# Patient Record
Sex: Male | Born: 1945
Health system: Southern US, Community
[De-identification: ages and names within clinical notes are randomized; demographics above are authoritative.]

## PROBLEM LIST (undated history)

## (undated) DIAGNOSIS — K219 Gastro-esophageal reflux disease without esophagitis: Secondary | ICD-10-CM

## (undated) DIAGNOSIS — I219 Acute myocardial infarction, unspecified: Secondary | ICD-10-CM

## (undated) DIAGNOSIS — I779 Disorder of arteries and arterioles, unspecified: Secondary | ICD-10-CM

## (undated) DIAGNOSIS — I472 Ventricular tachycardia: Secondary | ICD-10-CM

## (undated) DIAGNOSIS — E785 Hyperlipidemia, unspecified: Secondary | ICD-10-CM

## (undated) DIAGNOSIS — I10 Essential (primary) hypertension: Secondary | ICD-10-CM

## (undated) DIAGNOSIS — E119 Type 2 diabetes mellitus without complications: Secondary | ICD-10-CM

## (undated) DIAGNOSIS — Z95810 Presence of automatic (implantable) cardiac defibrillator: Secondary | ICD-10-CM

## (undated) DIAGNOSIS — I739 Peripheral vascular disease, unspecified: Secondary | ICD-10-CM

## (undated) DIAGNOSIS — C801 Malignant (primary) neoplasm, unspecified: Secondary | ICD-10-CM

## (undated) DIAGNOSIS — M1990 Unspecified osteoarthritis, unspecified site: Secondary | ICD-10-CM

## (undated) HISTORY — PX: ELBOW SURGERY: SHX618

## (undated) HISTORY — DX: Peripheral vascular disease, unspecified: I73.9

## (undated) HISTORY — PX: COLON SURGERY: SHX602

## (undated) HISTORY — DX: Disorder of arteries and arterioles, unspecified: I77.9

## (undated) HISTORY — PX: BACK SURGERY: SHX140

## (undated) HISTORY — PX: CHOLECYSTECTOMY: SHX55

---

## 1993-06-29 HISTORY — PX: CORONARY ARTERY BYPASS GRAFT: SHX141

## 1998-04-18 ENCOUNTER — Encounter: Admission: RE | Admit: 1998-04-18 | Discharge: 1998-04-18 | Payer: Self-pay | Admitting: *Deleted

## 1999-01-06 ENCOUNTER — Other Ambulatory Visit: Admission: RE | Admit: 1999-01-06 | Discharge: 1999-01-06 | Payer: Self-pay | Admitting: Gastroenterology

## 1999-01-21 ENCOUNTER — Encounter: Payer: Self-pay | Admitting: General Surgery

## 1999-01-23 ENCOUNTER — Inpatient Hospital Stay (HOSPITAL_COMMUNITY): Admission: RE | Admit: 1999-01-23 | Discharge: 1999-01-28 | Payer: Self-pay | Admitting: General Surgery

## 1999-12-10 ENCOUNTER — Ambulatory Visit (HOSPITAL_COMMUNITY): Admission: RE | Admit: 1999-12-10 | Discharge: 1999-12-10 | Payer: Self-pay | Admitting: Orthopedic Surgery

## 1999-12-10 ENCOUNTER — Encounter: Payer: Self-pay | Admitting: Orthopedic Surgery

## 2001-02-19 ENCOUNTER — Encounter: Payer: Self-pay | Admitting: Orthopedic Surgery

## 2001-02-19 ENCOUNTER — Ambulatory Visit (HOSPITAL_COMMUNITY): Admission: RE | Admit: 2001-02-19 | Discharge: 2001-02-19 | Payer: Self-pay | Admitting: Orthopedic Surgery

## 2001-06-13 ENCOUNTER — Ambulatory Visit (HOSPITAL_COMMUNITY): Admission: RE | Admit: 2001-06-13 | Discharge: 2001-06-13 | Payer: Self-pay | Admitting: Orthopedic Surgery

## 2001-06-13 ENCOUNTER — Encounter: Payer: Self-pay | Admitting: Orthopedic Surgery

## 2001-10-17 ENCOUNTER — Ambulatory Visit (HOSPITAL_COMMUNITY): Admission: RE | Admit: 2001-10-17 | Discharge: 2001-10-17 | Payer: Self-pay | Admitting: Specialist

## 2001-10-17 ENCOUNTER — Encounter: Payer: Self-pay | Admitting: Specialist

## 2004-08-01 ENCOUNTER — Ambulatory Visit: Payer: Self-pay | Admitting: Internal Medicine

## 2007-09-21 ENCOUNTER — Encounter: Payer: Self-pay | Admitting: Internal Medicine

## 2008-12-19 ENCOUNTER — Encounter: Payer: Self-pay | Admitting: Internal Medicine

## 2010-01-02 ENCOUNTER — Encounter: Payer: Self-pay | Admitting: Internal Medicine

## 2010-07-31 NOTE — Letter (Signed)
Summary: [facility]   Imported By: Sherian Rein 01/08/2010 14:24:31  _____________________________________________________________________  External Attachment:    Type:   Image     Comment:   External Document

## 2011-06-09 DIAGNOSIS — I252 Old myocardial infarction: Secondary | ICD-10-CM | POA: Insufficient documentation

## 2011-06-09 DIAGNOSIS — M5430 Sciatica, unspecified side: Secondary | ICD-10-CM | POA: Insufficient documentation

## 2011-06-09 DIAGNOSIS — M25569 Pain in unspecified knee: Secondary | ICD-10-CM | POA: Insufficient documentation

## 2011-06-09 DIAGNOSIS — I10 Essential (primary) hypertension: Secondary | ICD-10-CM | POA: Insufficient documentation

## 2011-06-10 ENCOUNTER — Emergency Department (HOSPITAL_COMMUNITY)
Admission: EM | Admit: 2011-06-10 | Discharge: 2011-06-10 | Disposition: A | Discharge status: Home / Self Care | Payer: Medicare Other | Attending: Emergency Medicine | Admitting: Emergency Medicine

## 2011-06-10 ENCOUNTER — Encounter: Payer: Self-pay | Admitting: Emergency Medicine

## 2011-06-10 DIAGNOSIS — M5431 Sciatica, right side: Secondary | ICD-10-CM

## 2011-06-10 HISTORY — DX: Essential (primary) hypertension: I10

## 2011-06-10 HISTORY — DX: Malignant (primary) neoplasm, unspecified: C80.1

## 2011-06-10 HISTORY — DX: Acute myocardial infarction, unspecified: I21.9

## 2011-06-10 MED ORDER — OXYCODONE-ACETAMINOPHEN 5-325 MG PO TABS: 1.0000 | ORAL_TABLET | ORAL | Status: AC | PRN

## 2011-06-10 MED ORDER — ONDANSETRON 8 MG PO TBDP
8.0000 mg | ORAL_TABLET | Freq: Once | ORAL | Status: AC
  Administered 2011-06-10: 8 mg via ORAL
  Filled 2011-06-10: qty 1

## 2011-06-10 MED ORDER — HYDROMORPHONE HCL PF 2 MG/ML IJ SOLN
2.0000 mg | Freq: Once | INTRAMUSCULAR | Status: AC
  Administered 2011-06-10: 2 mg via INTRAMUSCULAR
  Filled 2011-06-10: qty 1

## 2011-06-10 MED ORDER — DIAZEPAM 5 MG PO TABS
5.0000 mg | ORAL_TABLET | Freq: Once | ORAL | Status: AC
  Administered 2011-06-10: 5 mg via ORAL
  Filled 2011-06-10: qty 1

## 2011-06-10 MED ORDER — DIAZEPAM 5 MG PO TABS: 5.0000 mg | ORAL_TABLET | Freq: Three times a day (TID) | ORAL | Status: AC | PRN

## 2011-06-10 NOTE — ED Provider Notes (Signed)
History     CSN: [id number] Arrival date & time: 06/10/2011 12:20 AM   First MD Initiated Contact with Patient 06/10/11 0138      Chief Complaint  Patient presents with  . Knee Pain     Patient is a 65 y.o. male presenting with knee pain. The history is provided by the patient.  Knee Pain This is a recurrent problem. The current episode started 1 to 4 weeks ago. The problem occurs constantly. The problem has been gradually worsening. Pertinent negatives include no numbness, urinary symptoms or weakness.  Reports approximate two-week history of right lower back pain that radiates into the right lower extremity. Has been seen at the [place], and MRI was obtained and patient was told there does seem to be some narrowing in his lower spine causing his symptoms. He was sent home with Tylenol and tramadol patient states Tylenol and tramadol are not helping his pain. He is scheduled to see a physician at the outpatient clinic at [facility] on December 26, the pain is so severe he needed something different for pain. Patient denies any neurological symptoms such as perineal numbness. Tingling, loss of bowel or bladder or weakness to the RLE.    Past Medical History  Diagnosis Date  . Myocardial infarct   . Cancer   . Hypertension     Past Surgical History  Procedure Date  . Cardiac surgery   . Colon surgery   . Elbow surgery     Family History  Problem Relation Age of Onset  . Hypertension Other     History  Substance Use Topics  . Smoking status: Never Smoker   . Smokeless tobacco: Not on file  . Alcohol Use: No      Review of Systems  Constitutional: Negative.   HENT: Negative.   Eyes: Negative.   Respiratory: Negative.   Cardiovascular: Negative.   Gastrointestinal: Negative.   Genitourinary: Negative.   Musculoskeletal: Negative.   Skin: Negative.   Neurological: Negative.  Negative for weakness and numbness.  Hematological: Negative.   Psychiatric/Behavioral:  Negative.     Allergies  Biaxin  Home Medications   Current Outpatient Rx  Name Route Sig Dispense Refill  . ACETAMINOPHEN 325 MG PO TABS Oral Take 650 mg by mouth every 6 (six) hours as needed.      . ASPIRIN 325 MG PO TABS Oral Take 325 mg by mouth daily.      [name] CLOPIDOGREL BISULFATE 75 MG PO TABS Oral Take 75 mg by mouth daily.      [name] DOXAZOSIN MESYLATE 2 MG PO TABS Oral Take 2 mg by mouth at bedtime.      [name] EZETIMIBE 10 MG PO TABS Oral Take 10 mg by mouth daily.      [name] LISINOPRIL 20 MG PO TABS Oral Take 20 mg by mouth daily.      [name] METOPROLOL TARTRATE 25 MG PO TABS Oral Take 25 mg by mouth 2 (two) times daily.      [name] PANTOPRAZOLE SODIUM 40 MG PO TBEC Oral Take 40 mg by mouth daily.      [name] SIMVASTATIN 80 MG PO TABS Oral Take 80 mg by mouth at bedtime.      . TRAMADOL HCL 50 MG PO TABS Oral Take 50 mg by mouth every 6 (six) hours as needed. Maximum dose= 8 tablets per day       BP 157/83  Pulse 70  Temp(Src) 98 F (36.7 C) (Oral)  Resp  18  SpO2 96%  Physical Exam  Constitutional: He is oriented to person, place, and time. He appears well-developed and well-nourished.  HENT:  Head: Normocephalic and atraumatic.  Eyes: Conjunctivae are normal.  Neck: Neck supple.  Cardiovascular: Normal rate and regular rhythm.   Pulmonary/Chest: Effort normal and breath sounds normal.  Abdominal: Soft. Bowel sounds are normal.  Musculoskeletal: Normal range of motion.       Back:  Neurological: He is alert and oriented to person, place, and time. He has normal reflexes.  Skin: Skin is warm and dry.  Psychiatric: He has a normal mood and affect.    ED Course  Procedures Pt w/ persistent LBP that radiates to RLE. Recently prescribed Tylenol and Tramadol for pain which is not helping. Will medicate for pain and plan for d/c home w/ stronger med for pain.   Pt reports significant relief of pain with medications. Will d/c home w/ Rx's for Percocet and Valium and encourage pt to keep  scheduled appointment at the Outpt Cinic on 06/24/2011. Pt agreeable w/ plan.  Labs Reviewed - No data to display No results found.   No diagnosis found.    MDM  Sciatica type (R) LBP w/o neurological symptoms.        Leanne Chang, NP 06/10/11 [phone]

## 2011-06-10 NOTE — ED Notes (Signed)
Pt ready for d/c . Pain better. Rates pain 3/10. Ambulatory. stable

## 2011-06-10 NOTE — ED Notes (Signed)
Pt states he started having pain 2 weeks ago  Pt states the pain runs down the back of his right leg down into his ankle  Pt states he is scheduled our outpt clinic on [facility] on the 26th but his pain is too bad to wait  Pt states he has been taking tramadol for the pain and has been using tylenol without relief

## 2011-06-10 NOTE — ED Provider Notes (Signed)
Medical screening examination/treatment/procedure(s) were performed by non-physician practitioner and as supervising physician I was immediately available for consultation/collaboration.   Cyndra Numbers, MD 06/10/11 1600

## 2011-06-15 ENCOUNTER — Ambulatory Visit: Payer: Non-veteran care | Admitting: Physical Therapy

## 2011-06-24 ENCOUNTER — Ambulatory Visit: Payer: Non-veteran care | Admitting: Physical Therapy

## 2011-07-02 DIAGNOSIS — M5137 Other intervertebral disc degeneration, lumbosacral region: Secondary | ICD-10-CM | POA: Diagnosis not present

## 2011-07-02 DIAGNOSIS — IMO0002 Reserved for concepts with insufficient information to code with codable children: Secondary | ICD-10-CM | POA: Diagnosis not present

## 2011-07-16 DIAGNOSIS — M5430 Sciatica, unspecified side: Secondary | ICD-10-CM | POA: Diagnosis not present

## 2011-07-24 DIAGNOSIS — M5137 Other intervertebral disc degeneration, lumbosacral region: Secondary | ICD-10-CM | POA: Diagnosis not present

## 2011-07-24 DIAGNOSIS — IMO0002 Reserved for concepts with insufficient information to code with codable children: Secondary | ICD-10-CM | POA: Diagnosis not present

## 2011-08-05 DIAGNOSIS — M5430 Sciatica, unspecified side: Secondary | ICD-10-CM | POA: Diagnosis not present

## 2011-08-07 DIAGNOSIS — M545 Low back pain: Secondary | ICD-10-CM | POA: Diagnosis not present

## 2011-08-07 DIAGNOSIS — M5137 Other intervertebral disc degeneration, lumbosacral region: Secondary | ICD-10-CM | POA: Diagnosis not present

## 2011-08-11 ENCOUNTER — Other Ambulatory Visit: Payer: Self-pay | Admitting: Neurological Surgery

## 2011-08-11 DIAGNOSIS — M549 Dorsalgia, unspecified: Secondary | ICD-10-CM

## 2011-08-14 ENCOUNTER — Ambulatory Visit
Admission: RE | Admit: 2011-08-14 | Discharge: 2011-08-14 | Disposition: A | Discharge status: Home / Self Care | Payer: Medicare Other | Source: Ambulatory Visit | Attending: Neurological Surgery | Admitting: Neurological Surgery

## 2011-08-14 VITALS — BP 126/60 | HR 44

## 2011-08-14 DIAGNOSIS — M549 Dorsalgia, unspecified: Secondary | ICD-10-CM

## 2011-08-14 DIAGNOSIS — M545 Low back pain: Secondary | ICD-10-CM | POA: Diagnosis not present

## 2011-08-14 DIAGNOSIS — M79609 Pain in unspecified limb: Secondary | ICD-10-CM | POA: Diagnosis not present

## 2011-08-14 MED ORDER — DIAZEPAM 5 MG PO TABS: 5.0000 mg | ORAL_TABLET | Freq: Once | ORAL | Status: DC

## 2011-08-14 MED ORDER — DIAZEPAM 5 MG PO TABS
5.0000 mg | ORAL_TABLET | Freq: Once | ORAL | Status: AC
  Administered 2011-08-14: 5 mg via ORAL

## 2011-08-14 NOTE — Progress Notes (Signed)
Patient states he has been off Plavix for the past five days.  jkl 

## 2011-08-26 DIAGNOSIS — IMO0002 Reserved for concepts with insufficient information to code with codable children: Secondary | ICD-10-CM | POA: Diagnosis not present

## 2011-09-01 DIAGNOSIS — M47817 Spondylosis without myelopathy or radiculopathy, lumbosacral region: Secondary | ICD-10-CM | POA: Diagnosis not present

## 2011-09-01 DIAGNOSIS — IMO0002 Reserved for concepts with insufficient information to code with codable children: Secondary | ICD-10-CM | POA: Diagnosis not present

## 2011-09-04 NOTE — Pre-Procedure Instructions (Signed)
20 Victor Estes  09/04/2011   Your procedure is scheduled on:  MARCH 13 (WED)  Report to [facility] at 12:45 AM.  Call this number if you have problems the morning of surgery: [phone]   Remember:   Do not eat food:After Midnight.  May have clear liquids: up to 4 Hours before arrival.  Clear liquids include soda, tea, black coffee, apple or grape juice, broth.  Take these medicines the morning of surgery with A SIP OF WATER: CARDURA, LOPRESSOR,PROTONIX,ULTRAM            PLAVIX AND ASPIRIN AS DIRECTED   Do not wear jewelry, make-up or nail polish.  Do not wear lotions, powders, or perfumes. You may wear deodorant.  Do not shave 48 hours prior to surgery.  Do not bring valuables to the hospital.  Contacts, dentures or bridgework may not be worn into surgery.  Leave suitcase in the car. After surgery it may be brought to your room.  For patients admitted to the hospital, checkout time is 11:00 AM the day of discharge.   Patients discharged the day of surgery will not be allowed to drive home.  Name and phone number of your driver: NA  Special Instructions: CHG Shower Use Special Wash: 1/2 bottle night before surgery and 1/2 bottle morning of surgery.   Please read over the following fact sheets that you were given: Pain Booklet, MRSA Information and Surgical Site Infection Prevention

## 2011-09-07 ENCOUNTER — Encounter (HOSPITAL_COMMUNITY): Payer: Self-pay | Admitting: Pharmacy Technician

## 2011-09-07 ENCOUNTER — Encounter (HOSPITAL_COMMUNITY): Payer: Self-pay

## 2011-09-07 ENCOUNTER — Encounter (HOSPITAL_COMMUNITY)
Admission: RE | Admit: 2011-09-07 | Discharge: 2011-09-07 | Disposition: A | Discharge status: Home / Self Care | Payer: Medicare Other | Source: Ambulatory Visit | Attending: Neurological Surgery | Admitting: Neurological Surgery

## 2011-09-07 HISTORY — DX: Gastro-esophageal reflux disease without esophagitis: K21.9

## 2011-09-07 LAB — PROTIME-INR: INR: 0.96 (ref 0.00–1.49)

## 2011-09-07 LAB — CBC
HCT: 43.5 % (ref 39.0–52.0)
MCHC: 36.1 g/dL — ABNORMAL HIGH (ref 30.0–36.0)
RDW: 12.8 % (ref 11.5–15.5)

## 2011-09-07 LAB — BASIC METABOLIC PANEL
BUN: 13 mg/dL (ref 6–23)
Creatinine, Ser: 0.98 mg/dL (ref 0.50–1.35)
GFR calc Af Amer: >90 mL/min (ref >90)
GFR calc non Af Amer: 84 mL/min — ABNORMAL LOW (ref >90)

## 2011-09-08 NOTE — Progress Notes (Signed)
Voicemail left requesting orders from Dr. Yetta Barre at [phone]. Instructed to fax orders to [phone].

## 2011-09-08 NOTE — Progress Notes (Signed)
Called for orders. Spoke with Erie Noe and she stated she would enter them.

## 2011-09-09 ENCOUNTER — Encounter (HOSPITAL_COMMUNITY): Payer: Self-pay | Admitting: Anesthesiology

## 2011-09-09 ENCOUNTER — Other Ambulatory Visit: Payer: Self-pay

## 2011-09-09 ENCOUNTER — Inpatient Hospital Stay (HOSPITAL_COMMUNITY)
Admission: RE | Admit: 2011-09-09 | Discharge: 2011-09-10 | DRG: 491 | Disposition: A | Discharge status: Home / Self Care | Payer: Medicare Other | Source: Ambulatory Visit | Attending: Neurological Surgery | Admitting: Neurological Surgery

## 2011-09-09 ENCOUNTER — Inpatient Hospital Stay (HOSPITAL_COMMUNITY): Payer: Medicare Other

## 2011-09-09 ENCOUNTER — Encounter (HOSPITAL_COMMUNITY)
Admission: RE | Disposition: A | Discharge status: Home / Self Care | Payer: Self-pay | Source: Ambulatory Visit | Attending: Neurological Surgery

## 2011-09-09 ENCOUNTER — Encounter (HOSPITAL_COMMUNITY): Payer: Self-pay | Admitting: Surgery

## 2011-09-09 ENCOUNTER — Inpatient Hospital Stay (HOSPITAL_COMMUNITY): Payer: Medicare Other | Admitting: Anesthesiology

## 2011-09-09 DIAGNOSIS — K219 Gastro-esophageal reflux disease without esophagitis: Secondary | ICD-10-CM | POA: Diagnosis present

## 2011-09-09 DIAGNOSIS — I10 Essential (primary) hypertension: Secondary | ICD-10-CM | POA: Diagnosis not present

## 2011-09-09 DIAGNOSIS — M48061 Spinal stenosis, lumbar region without neurogenic claudication: Principal | ICD-10-CM | POA: Diagnosis present

## 2011-09-09 DIAGNOSIS — Z79899 Other long term (current) drug therapy: Secondary | ICD-10-CM | POA: Diagnosis not present

## 2011-09-09 DIAGNOSIS — Z7982 Long term (current) use of aspirin: Secondary | ICD-10-CM

## 2011-09-09 DIAGNOSIS — Z7902 Long term (current) use of antithrombotics/antiplatelets: Secondary | ICD-10-CM | POA: Diagnosis not present

## 2011-09-09 DIAGNOSIS — Z01811 Encounter for preprocedural respiratory examination: Secondary | ICD-10-CM | POA: Diagnosis not present

## 2011-09-09 DIAGNOSIS — I517 Cardiomegaly: Secondary | ICD-10-CM | POA: Diagnosis not present

## 2011-09-09 DIAGNOSIS — Z951 Presence of aortocoronary bypass graft: Secondary | ICD-10-CM | POA: Diagnosis not present

## 2011-09-09 DIAGNOSIS — Z9889 Other specified postprocedural states: Secondary | ICD-10-CM

## 2011-09-09 DIAGNOSIS — M545 Low back pain, unspecified: Secondary | ICD-10-CM | POA: Diagnosis not present

## 2011-09-09 DIAGNOSIS — Z01812 Encounter for preprocedural laboratory examination: Secondary | ICD-10-CM | POA: Diagnosis not present

## 2011-09-09 DIAGNOSIS — M47817 Spondylosis without myelopathy or radiculopathy, lumbosacral region: Secondary | ICD-10-CM | POA: Diagnosis not present

## 2011-09-09 DIAGNOSIS — M519 Unspecified thoracic, thoracolumbar and lumbosacral intervertebral disc disorder: Secondary | ICD-10-CM | POA: Diagnosis not present

## 2011-09-09 DIAGNOSIS — I252 Old myocardial infarction: Secondary | ICD-10-CM | POA: Diagnosis not present

## 2011-09-09 DIAGNOSIS — IMO0002 Reserved for concepts with insufficient information to code with codable children: Secondary | ICD-10-CM | POA: Diagnosis not present

## 2011-09-09 DIAGNOSIS — M79609 Pain in unspecified limb: Secondary | ICD-10-CM | POA: Diagnosis not present

## 2011-09-09 HISTORY — PX: LUMBAR LAMINECTOMY/DECOMPRESSION MICRODISCECTOMY: SHX5026

## 2011-09-09 LAB — DIFFERENTIAL
Eosinophils Absolute: 0.3 10*3/uL (ref 0.0–0.7)
Eosinophils Relative: 2 % (ref 0–5)
Lymphocytes Relative: 15 % (ref 12–46)
Lymphs Abs: 1.7 10*3/uL (ref 0.7–4.0)
Monocytes Absolute: 1.1 10*3/uL — ABNORMAL HIGH (ref 0.1–1.0)

## 2011-09-09 SURGERY — LUMBAR LAMINECTOMY/DECOMPRESSION MICRODISCECTOMY 1 LEVEL
Anesthesia: General | Site: Back | Laterality: Right | Wound class: Clean

## 2011-09-09 MED ORDER — SODIUM CHLORIDE 0.9 % IJ SOLN: 3.0000 mL | INTRAMUSCULAR | Status: DC | PRN

## 2011-09-09 MED ORDER — FENTANYL CITRATE 0.05 MG/ML IJ SOLN
INTRAMUSCULAR | Status: AC
  Filled 2011-09-09: qty 2

## 2011-09-09 MED ORDER — FENTANYL CITRATE 0.05 MG/ML IJ SOLN
INTRAMUSCULAR | Status: DC | PRN
  Administered 2011-09-09: 50 ug via INTRAVENOUS
  Administered 2011-09-09: 150 ug via INTRAVENOUS

## 2011-09-09 MED ORDER — FENTANYL CITRATE 0.05 MG/ML IJ SOLN
INTRAMUSCULAR | Status: DC | PRN
  Administered 2011-09-09: 100 ug via INTRAVENOUS

## 2011-09-09 MED ORDER — DOXAZOSIN MESYLATE 2 MG PO TABS
2.0000 mg | ORAL_TABLET | Freq: Every day | ORAL | Status: DC
Start: 2011-09-09 — End: 2011-09-10
  Administered 2011-09-09: 2 mg via ORAL
  Filled 2011-09-09 (×2): qty 1

## 2011-09-09 MED ORDER — ONDANSETRON HCL 4 MG/2ML IJ SOLN: 4.0000 mg | Freq: Once | INTRAMUSCULAR | Status: DC | PRN

## 2011-09-09 MED ORDER — HYDROCODONE-ACETAMINOPHEN 5-325 MG PO TABS
1.0000 | ORAL_TABLET | ORAL | Status: DC | PRN
  Administered 2011-09-09 – 2011-09-10 (×3): 2 via ORAL
  Filled 2011-09-09 (×2): qty 2

## 2011-09-09 MED ORDER — DEXAMETHASONE SODIUM PHOSPHATE 10 MG/ML IJ SOLN
INTRAMUSCULAR | Status: AC
  Filled 2011-09-09: qty 1

## 2011-09-09 MED ORDER — DEXAMETHASONE SODIUM PHOSPHATE 4 MG/ML IJ SOLN
4.0000 mg | Freq: Four times a day (QID) | INTRAMUSCULAR | Status: DC
  Filled 2011-09-09 (×4): qty 1

## 2011-09-09 MED ORDER — DEXAMETHASONE SODIUM PHOSPHATE 10 MG/ML IJ SOLN
10.0000 mg | Freq: Once | INTRAMUSCULAR | Status: AC
  Administered 2011-09-09: 10 mg via INTRAVENOUS

## 2011-09-09 MED ORDER — THROMBIN 5000 UNITS EX KIT
PACK | CUTANEOUS | Status: DC | PRN
  Administered 2011-09-09 (×2): 5000 [IU] via TOPICAL

## 2011-09-09 MED ORDER — HEMOSTATIC AGENTS (NO CHARGE) OPTIME
TOPICAL | Status: DC | PRN
  Administered 2011-09-09: 1 via TOPICAL

## 2011-09-09 MED ORDER — ACETAMINOPHEN 325 MG PO TABS: 650.0000 mg | ORAL_TABLET | ORAL | Status: DC | PRN

## 2011-09-09 MED ORDER — SENNA 8.6 MG PO TABS
1.0000 | ORAL_TABLET | Freq: Two times a day (BID) | ORAL | Status: DC
  Administered 2011-09-09 (×2): 8.6 mg via ORAL
  Filled 2011-09-09 (×4): qty 1

## 2011-09-09 MED ORDER — NEOSTIGMINE METHYLSULFATE 1 MG/ML IJ SOLN
INTRAMUSCULAR | Status: DC | PRN
  Administered 2011-09-09: 3 mg via INTRAVENOUS

## 2011-09-09 MED ORDER — LISINOPRIL 20 MG PO TABS
20.0000 mg | ORAL_TABLET | Freq: Every day | ORAL | Status: DC
  Administered 2011-09-09: 20 mg via ORAL
  Filled 2011-09-09 (×2): qty 1

## 2011-09-09 MED ORDER — METHYLPREDNISOLONE ACETATE PF 80 MG/ML IJ SUSP
INTRAMUSCULAR | Status: DC | PRN
  Administered 2011-09-09: 80 mg

## 2011-09-09 MED ORDER — ZOLPIDEM TARTRATE 5 MG PO TABS: 5.0000 mg | ORAL_TABLET | Freq: Every evening | ORAL | Status: DC | PRN

## 2011-09-09 MED ORDER — LACTATED RINGERS IV SOLN
INTRAVENOUS | Status: DC | PRN
  Administered 2011-09-09 (×2): via INTRAVENOUS

## 2011-09-09 MED ORDER — EPHEDRINE SULFATE 50 MG/ML IJ SOLN
INTRAMUSCULAR | Status: DC | PRN
  Administered 2011-09-09: 10 mg via INTRAVENOUS

## 2011-09-09 MED ORDER — METOPROLOL TARTRATE 25 MG PO TABS
25.0000 mg | ORAL_TABLET | Freq: Two times a day (BID) | ORAL | Status: DC
  Filled 2011-09-09 (×4): qty 1

## 2011-09-09 MED ORDER — CEFAZOLIN SODIUM-DEXTROSE 2-3 GM-% IV SOLR
INTRAVENOUS | Status: AC
  Filled 2011-09-09: qty 50

## 2011-09-09 MED ORDER — 0.9 % SODIUM CHLORIDE (POUR BTL) OPTIME
TOPICAL | Status: DC | PRN
  Administered 2011-09-09: 1000 mL

## 2011-09-09 MED ORDER — MUPIROCIN 2 % EX OINT
TOPICAL_OINTMENT | CUTANEOUS | Status: AC
  Filled 2011-09-09: qty 22

## 2011-09-09 MED ORDER — ACETAMINOPHEN 650 MG RE SUPP: 650.0000 mg | RECTAL | Status: DC | PRN

## 2011-09-09 MED ORDER — CEFAZOLIN SODIUM 1-5 GM-% IV SOLN
1.0000 g | Freq: Three times a day (TID) | INTRAVENOUS | Status: AC
  Administered 2011-09-09 (×2): 1 g via INTRAVENOUS
  Filled 2011-09-09 (×2): qty 50

## 2011-09-09 MED ORDER — KETOROLAC TROMETHAMINE 30 MG/ML IJ SOLN
INTRAMUSCULAR | Status: DC | PRN
  Administered 2011-09-09: 30 mg via INTRAVENOUS

## 2011-09-09 MED ORDER — MENTHOL 3 MG MT LOZG
1.0000 | LOZENGE | OROMUCOSAL | Status: DC | PRN
  Filled 2011-09-09: qty 9

## 2011-09-09 MED ORDER — ASPIRIN 325 MG PO TABS
325.0000 mg | ORAL_TABLET | Freq: Every day | ORAL | Status: DC
Start: 2011-09-09 — End: 2011-09-10
  Administered 2011-09-09: 325 mg via ORAL
  Filled 2011-09-09 (×2): qty 1

## 2011-09-09 MED ORDER — HYDROMORPHONE HCL PF 1 MG/ML IJ SOLN
0.5000 mg | INTRAMUSCULAR | Status: DC | PRN
  Administered 2011-09-09: 1 mg via INTRAVENOUS
  Filled 2011-09-09: qty 1

## 2011-09-09 MED ORDER — PHENYLEPHRINE HCL 10 MG/ML IJ SOLN
INTRAMUSCULAR | Status: DC | PRN
  Administered 2011-09-09: 40 ug via INTRAVENOUS

## 2011-09-09 MED ORDER — PROPOFOL 10 MG/ML IV EMUL
INTRAVENOUS | Status: DC | PRN
  Administered 2011-09-09: 150 mg via INTRAVENOUS

## 2011-09-09 MED ORDER — CEFAZOLIN SODIUM-DEXTROSE 2-3 GM-% IV SOLR: 2.0000 g | INTRAVENOUS | Status: DC

## 2011-09-09 MED ORDER — ROCURONIUM BROMIDE 100 MG/10ML IV SOLN
INTRAVENOUS | Status: DC | PRN
  Administered 2011-09-09: 50 mg via INTRAVENOUS
  Administered 2011-09-09: 5 mg via INTRAVENOUS

## 2011-09-09 MED ORDER — POTASSIUM CHLORIDE IN NACL 20-0.9 MEQ/L-% IV SOLN
INTRAVENOUS | Status: DC
  Administered 2011-09-09: 16:00:00 via INTRAVENOUS
  Filled 2011-09-09 (×3): qty 1000

## 2011-09-09 MED ORDER — EZETIMIBE 10 MG PO TABS
10.0000 mg | ORAL_TABLET | Freq: Every day | ORAL | Status: DC
  Administered 2011-09-09: 10 mg via ORAL
  Filled 2011-09-09 (×2): qty 1

## 2011-09-09 MED ORDER — SODIUM CHLORIDE 0.9 % IV SOLN
INTRAVENOUS | Status: AC
  Filled 2011-09-09: qty 500

## 2011-09-09 MED ORDER — SODIUM CHLORIDE 0.9 % IR SOLN
Status: DC | PRN
  Administered 2011-09-09: 12:00:00

## 2011-09-09 MED ORDER — GLYCOPYRROLATE 0.2 MG/ML IJ SOLN
INTRAMUSCULAR | Status: DC | PRN
  Administered 2011-09-09: .6 mg via INTRAVENOUS

## 2011-09-09 MED ORDER — SODIUM CHLORIDE 0.9 % IV SOLN: 250.0000 mL | INTRAVENOUS | Status: DC

## 2011-09-09 MED ORDER — HYDROCODONE-ACETAMINOPHEN 5-325 MG PO TABS
ORAL_TABLET | ORAL | Status: AC
  Filled 2011-09-09: qty 2

## 2011-09-09 MED ORDER — DEXAMETHASONE 4 MG PO TABS
4.0000 mg | ORAL_TABLET | Freq: Four times a day (QID) | ORAL | Status: DC
  Administered 2011-09-09 – 2011-09-10 (×3): 4 mg via ORAL
  Filled 2011-09-09 (×7): qty 1

## 2011-09-09 MED ORDER — SODIUM CHLORIDE 0.9 % IJ SOLN
3.0000 mL | Freq: Two times a day (BID) | INTRAMUSCULAR | Status: DC
  Administered 2011-09-09: 3 mL via INTRAVENOUS

## 2011-09-09 MED ORDER — PHENOL 1.4 % MT LIQD: 1.0000 | OROMUCOSAL | Status: DC | PRN

## 2011-09-09 MED ORDER — PANTOPRAZOLE SODIUM 40 MG PO TBEC
40.0000 mg | DELAYED_RELEASE_TABLET | Freq: Every day | ORAL | Status: DC
  Administered 2011-09-09: 40 mg via ORAL
  Filled 2011-09-09 (×2): qty 1

## 2011-09-09 MED ORDER — ONDANSETRON HCL 4 MG/2ML IJ SOLN: 4.0000 mg | INTRAMUSCULAR | Status: DC | PRN

## 2011-09-09 MED ORDER — HYDROMORPHONE HCL PF 1 MG/ML IJ SOLN: 0.2500 mg | INTRAMUSCULAR | Status: DC | PRN

## 2011-09-09 MED ORDER — MIDAZOLAM HCL 5 MG/5ML IJ SOLN
INTRAMUSCULAR | Status: DC | PRN
  Administered 2011-09-09: 1 mg via INTRAVENOUS

## 2011-09-09 MED ORDER — BACITRACIN 50000 UNITS IM SOLR
INTRAMUSCULAR | Status: AC
  Filled 2011-09-09: qty 1

## 2011-09-09 SURGICAL SUPPLY — 49 items
BAG DECANTER FOR FLEXI CONT (MISCELLANEOUS) ×2 IMPLANT
BENZOIN TINCTURE PRP APPL 2/3 (GAUZE/BANDAGES/DRESSINGS) ×2 IMPLANT
BUR MATCHSTICK NEURO 3.0 LAGG (BURR) ×2 IMPLANT
CANISTER SUCTION 2500CC (MISCELLANEOUS) ×2 IMPLANT
CLOTH BEACON ORANGE TIMEOUT ST (SAFETY) ×2 IMPLANT
CONT SPEC 4OZ CLIKSEAL STRL BL (MISCELLANEOUS) ×2 IMPLANT
DRAPE LAPAROTOMY 100X72X124 (DRAPES) ×2 IMPLANT
DRAPE MICROSCOPE ZEISS OPMI (DRAPES) ×2 IMPLANT
DRAPE POUCH INSTRU U-SHP 10X18 (DRAPES) ×2 IMPLANT
DRAPE SURG 17X23 STRL (DRAPES) ×2 IMPLANT
DRESSING TELFA 8X3 (GAUZE/BANDAGES/DRESSINGS) ×2 IMPLANT
DRSG OPSITE 4X5.5 SM (GAUZE/BANDAGES/DRESSINGS) ×2 IMPLANT
DURAPREP 26ML APPLICATOR (WOUND CARE) ×2 IMPLANT
ELECT REM PT RETURN 9FT ADLT (ELECTROSURGICAL) ×2
ELECTRODE REM PT RTRN 9FT ADLT (ELECTROSURGICAL) ×1 IMPLANT
GAUZE SPONGE 4X4 16PLY XRAY LF (GAUZE/BANDAGES/DRESSINGS) IMPLANT
GLOVE BIO SURGEON STRL SZ8 (GLOVE) ×2 IMPLANT
GLOVE BIOGEL PI IND STRL 7.0 (GLOVE) ×1 IMPLANT
GLOVE BIOGEL PI IND STRL 7.5 (GLOVE) ×1 IMPLANT
GLOVE BIOGEL PI IND STRL 8 (GLOVE) ×1 IMPLANT
GLOVE BIOGEL PI INDICATOR 7.0 (GLOVE) ×1
GLOVE BIOGEL PI INDICATOR 7.5 (GLOVE) ×1
GLOVE BIOGEL PI INDICATOR 8 (GLOVE) ×1
GLOVE ECLIPSE 7.5 STRL STRAW (GLOVE) ×6 IMPLANT
GLOVE SURG SS PI 6.5 STRL IVOR (GLOVE) ×4 IMPLANT
GOWN BRE IMP SLV AUR LG STRL (GOWN DISPOSABLE) IMPLANT
GOWN BRE IMP SLV AUR XL STRL (GOWN DISPOSABLE) ×4 IMPLANT
GOWN STRL REIN 2XL LVL4 (GOWN DISPOSABLE) ×4 IMPLANT
HEMOSTAT POWDER KIT SURGIFOAM (HEMOSTASIS) IMPLANT
KIT BASIN OR (CUSTOM PROCEDURE TRAY) ×2 IMPLANT
KIT ROOM TURNOVER OR (KITS) ×2 IMPLANT
NEEDLE HYPO 18GX1.5 BLUNT FILL (NEEDLE) ×2 IMPLANT
NEEDLE HYPO 25X1 1.5 SAFETY (NEEDLE) ×2 IMPLANT
NEEDLE SPNL 20GX3.5 QUINCKE YW (NEEDLE) ×2 IMPLANT
NS IRRIG 1000ML POUR BTL (IV SOLUTION) ×2 IMPLANT
PACK LAMINECTOMY NEURO (CUSTOM PROCEDURE TRAY) ×2 IMPLANT
PAD ARMBOARD 7.5X6 YLW CONV (MISCELLANEOUS) ×10 IMPLANT
RUBBERBAND STERILE (MISCELLANEOUS) ×4 IMPLANT
SPONGE SURGIFOAM ABS GEL SZ50 (HEMOSTASIS) ×2 IMPLANT
STRIP CLOSURE SKIN 1/2X4 (GAUZE/BANDAGES/DRESSINGS) ×2 IMPLANT
SUT VIC AB 0 CT1 18XCR BRD8 (SUTURE) ×1 IMPLANT
SUT VIC AB 0 CT1 8-18 (SUTURE) ×1
SUT VIC AB 2-0 CP2 18 (SUTURE) ×2 IMPLANT
SUT VIC AB 3-0 SH 8-18 (SUTURE) ×4 IMPLANT
SYR 20ML ECCENTRIC (SYRINGE) ×2 IMPLANT
SYR 5ML LL (SYRINGE) ×2 IMPLANT
TOWEL OR 17X24 6PK STRL BLUE (TOWEL DISPOSABLE) ×2 IMPLANT
TOWEL OR 17X26 10 PK STRL BLUE (TOWEL DISPOSABLE) ×2 IMPLANT
WATER STERILE IRR 1000ML POUR (IV SOLUTION) ×2 IMPLANT

## 2011-09-09 NOTE — Anesthesia Procedure Notes (Signed)
Procedure Name: Intubation Date/Time: 09/02/2011 11:05 AM Performed by: Gwenyth Allegra Pre-anesthesia Checklist: Patient identified, Timeout performed, Emergency Drugs available, Suction available and Patient being monitored Patient Re-evaluated:Patient Re-evaluated prior to inductionOxygen Delivery Method: Circle system utilized Preoxygenation: Pre-oxygenation with 100% oxygen Ventilation: Mask ventilation without difficulty and Oral airway inserted - appropriate to patient size Laryngoscope Size: Mac and 3 Grade View: Grade I Tube type: Oral Number of attempts: 1 Airway Equipment and Method: Stylet Placement Confirmation: ETT inserted through vocal cords under direct vision,  positive ETCO2 and breath sounds checked- equal and bilateral Secured at: 21 cm Tube secured with: Tape Dental Injury: Teeth and Oropharynx as per pre-operative assessment

## 2011-09-09 NOTE — Consult Note (Addendum)
Subjective: Patient is a 66 y.o. male admitted for R leg pain. Onset of symptoms was several months ago, gradually worsening since that time.  The pain is rated severe, and is located at the right gluteal area and radiates to leg. The pain is described as burning and stabbing and occurs intermittently. The symptoms have been progressive. Symptoms are exacerbated by walking for more than a few minutes. MRI or CT showed foraminal stenosis at L5-S1.   Past Medical History  Diagnosis Date  . Myocardial infarct   . Cancer   . Hypertension   . GERD (gastroesophageal reflux disease)     Past Surgical History  Procedure Date  . Cardiac surgery   . Colon surgery   . Elbow surgery   . Coronary artery bypass graft     Prior to Admission medications   Medication Sig Start Date End Date Taking? Authorizing Provider  acetaminophen (TYLENOL) 325 MG tablet Take 650 mg by mouth every 6 (six) hours as needed.     Yes Historical Provider, MD  aspirin 325 MG tablet Take 325 mg by mouth daily.     Yes Historical Provider, MD  clopidogrel (PLAVIX) 75 MG tablet Take 75 mg by mouth daily.     Yes Historical Provider, MD  doxazosin (CARDURA) 2 MG tablet Take 2 mg by mouth at bedtime.     Yes Historical Provider, MD  ezetimibe (ZETIA) 10 MG tablet Take 10 mg by mouth daily.     Yes Historical Provider, MD  lisinopril (PRINIVIL,ZESTRIL) 20 MG tablet Take 20 mg by mouth daily.     Yes Historical Provider, MD  metoprolol tartrate (LOPRESSOR) 25 MG tablet Take 25 mg by mouth 2 (two) times daily.    Yes Historical Provider, MD  omega-3 acid ethyl esters (LOVAZA) 1 G capsule Take 2 g by mouth daily.    Yes Historical Provider, MD  pantoprazole (PROTONIX) 40 MG tablet Take 40 mg by mouth daily.     Yes Historical Provider, MD  PRESCRIPTION MEDICATION Take 1 tablet by mouth 4 (four) times daily. ALOH 160MG  , CARB 105MG  CHEWABLE TABLETS (LIKE A TUMS)   Yes Historical Provider, MD  simvastatin (ZOCOR) 80 MG tablet Take  80 mg by mouth at bedtime.     Yes Historical Provider, MD  traMADol (ULTRAM) 50 MG tablet Take 50 mg by mouth every 6 (six) hours as needed. Maximum dose= 8 tablets per day    Yes Historical Provider, MD   Allergies  Allergen Reactions  . Biaxin Swelling    History  Substance Use Topics  . Smoking status: Never Smoker   . Smokeless tobacco: Not on file  . Alcohol Use: No    Family History  Problem Relation Age of Onset  . Hypertension Other      Review of Systems  Positive ROS: Negative  All other systems have been reviewed and were otherwise negative with the exception of those mentioned in the HPI and as above.  Objective: Vital signs in last 24 hours: Temp:  [97.9 F (36.6 C)] 97.9 F (36.6 C) (03/13 0932) Pulse Rate:  [47] 47  (03/13 0932) Resp:  [18] 18  (03/13 0932) BP: (128)/(73) 128/73 mmHg (03/13 0932) SpO2:  [96 %] 96 % (03/13 0932)  General Appearance: Alert, cooperative, no distress, appears stated age Head: Normocephalic, without obvious abnormality, atraumatic Eyes: PERRL, conjunctiva/corneas clear, EOM's intact, fundi benign, both eyes      Ears: Normal TM's and external ear canals, both ears  Throat: Lips, mucosa, and tongue normal; teeth and gums normal Neck: Supple, symmetrical, trachea midline, no adenopathy; thyroid: No enlargement/tenderness/nodules; no carotid bruit or JVD Back: Symmetric, no curvature, ROM normal, no CVA tenderness Lungs: Clear to auscultation bilaterally, respirations unlabored Heart: Regular rate and rhythm, S1 and S2 normal, no murmur, rub or gallop Abdomen: Soft, non-tender, bowel sounds active all four quadrants, no masses, no organomegaly Extremities: Extremities normal, atraumatic, no cyanosis or edema Pulses: 2+ and symmetric all extremities Skin: Skin color, texture, turgor normal, no rashes or lesions  NEUROLOGIC:   Mental status: Alert and oriented x4,  no aphasia, good attention span, fund of knowledge, and  memory Motor Exam - grossly normal Sensory Exam - grossly normal Reflexes: Normal Coordination - grossly normal Gait - grossly normal Balance - grossly normal Cranial Nerves: I: smell Not tested  II: visual acuity  OS: nl    OD: nl  II: visual fields Full to confrontation  II: pupils Equal, round, reactive to light  III,VII: ptosis None  III,IV,VI: extraocular muscles  Full ROM  V: mastication Normal  V: facial light touch sensation  Normal  V,VII: corneal reflex  Present  VII: facial muscle function - upper  Normal  VII: facial muscle function - lower Normal  VIII: hearing Not tested  IX: soft palate elevation  Normal  IX,X: gag reflex Present  XI: trapezius strength  5/5  XI: sternocleidomastoid strength 5/5  XI: neck flexion strength  5/5  XII: tongue strength  Normal    Data Review Lab Results  Component Value Date   WBC 9.0 09/07/2011   HGB 15.7 09/07/2011   HCT 43.5 09/07/2011   MCV 88.8 09/07/2011   PLT 232 09/07/2011   Lab Results  Component Value Date   NA 138 09/07/2011   K 4.3 09/07/2011   CL 103 09/07/2011   CO2 26 09/07/2011   BUN 13 09/07/2011   CREATININE 0.98 09/07/2011   GLUCOSE 153* 09/07/2011   Lab Results  Component Value Date   INR 0.96 09/07/2011    Assessment/Plan: Patient admitted for hemilaminectomy for decompression of the L5 and S1 nerve roots. Patient has failed conservative therapy.  I explained the condition and procedure to the patient and answered any questions.  Patient wishes to proceed with procedure as planned. Understands risks/ benefits and typical outcomes of procedure.   Suleiman Finigan S 09/09/2011 10:17 AM   This is a history and physical admit note, not a consult note

## 2011-09-09 NOTE — Anesthesia Postprocedure Evaluation (Signed)
  Anesthesia Post-op Note  Patient: Victor Estes  Procedure(s) Performed: Procedure(s) (LRB): LUMBAR LAMINECTOMY/DECOMPRESSION MICRODISCECTOMY 1 LEVEL (Right)  Patient Location: PACU  Anesthesia Type: General  Level of Consciousness: awake, alert  and oriented  Airway and Oxygen Therapy: Patient Spontanous Breathing  Post-op Pain: mild  Post-op Assessment: Post-op Vital signs reviewed and Patient's Cardiovascular Status Stable  Post-op Vital Signs: stable  Complications: No apparent anesthesia complications

## 2011-09-09 NOTE — Preoperative (Signed)
Beta Blockers   Reason not to administer Beta Blockers:Not Applicable 

## 2011-09-09 NOTE — Progress Notes (Signed)
Noted abnormal EKG.  Had received copies of OV, results of ECHO, and last EKG(12/2010) from Dr. Clayborne Dana office  Notified A Zelenak,PA.  She reviewed and stated to proceed.  [facility] to refax EKG since ours was not a clear copy.  Nurse stated she would.//L. Chardai Gangemi,RN

## 2011-09-09 NOTE — Transfer of Care (Signed)
Immediate Anesthesia Transfer of Care Note  Patient: Victor Estes  Procedure(s) Performed: Procedure(s) (LRB): LUMBAR LAMINECTOMY/DECOMPRESSION MICRODISCECTOMY 1 LEVEL (Right)  Patient Location: PACU  Anesthesia Type: General  Level of Consciousness: awake and alert   Airway & Oxygen Therapy: Patient Spontanous Breathing and Patient connected to nasal cannula oxygen  Post-op Assessment: Report given to PACU RN and Post -op Vital signs reviewed and stable  Post vital signs: Reviewed and stable  Complications: No apparent anesthesia complications

## 2011-09-09 NOTE — Op Note (Signed)
09/09/2011  12:53 PM  PATIENT:  Victor Estes  66 y.o. male  PRE-OPERATIVE DIAGNOSIS:  Right L5 radiculopathy secondary to spinal stenosis  POST-OPERATIVE DIAGNOSIS:  same  PROCEDURE:  decompressive Right L4-5 and L5-S1 hemilaminectomy, medial facetectomy, foraminotomy for decompression of the L5 and S1 nerve roots  SURGEON:  Marikay Alar, MD  ASSISTANTS: dr Newell Coral  ANESTHESIA:   General  EBL: 25 ml  Total I/O In: 1000 [I.V.:1000] Out: -   BLOOD ADMINISTERED:none  DRAINS: none   SPECIMEN:  No Specimen  INDICATION FOR PROCEDURE: presented with Right leg pain. EMGs conformed right L5 radiculopathy. MRI and CT/myelo revealed stenosis around right L5 root. Failed med management. Recommended a right laminectomy for decompression.  Patient understood the risks, benefits, and alternatives and potential outcomes and wished to proceed.  PROCEDURE DETAILS: The patient was taken to the operating room and after induction of adequate generalized endotracheal anesthesia, the patient was rolled into the prone position on the Wilson frame and all pressure points were padded. The lumbar region was cleaned and then prepped with DuraPrep and draped in the usual sterile fashion. 5 cc of local anesthesia was injected and then a dorsal midline incision was made and carried down to the lumbo sacral fascia. The fascia was opened and the paraspinous musculature was taken down in a subperiosteal fashion to expose L4-5 and L5-S1  on the right.. Intraoperative x-ray confirmed my level, and then I used a combination of the high-speed drill and the Kerrison punches to perform a hemilaminectomy, medial facetectomy, and foraminotomy at L4-5 and L5-S1  on the right. The underlying yellow ligament was opened and removed in a piecemeal fashion to expose the underlying dura and exiting nerve root. I undercut the lateral recess and dissected down until I was medial to and distal to the S1 pedicle. The S1 nerve  root was well decompressed. I then skeletonized the right L5 pedicle. I undercut the right lateral recess with curettes and kerrisons.We then gently retracted the nerve root medially with a retractor, coagulated the epidural venous vasculature, and inspected the disc space at both levels. . I then palpated with a coronary dilator along the nerve root and into the foramen to assure adequate decompression. I felt no more compression of the L5 nerve root. I irrigated with saline solution containing bacitracin. Achieved hemostasis with bipolar cautery, lined the dura with Gelfoam, and then closed the fascia with 0 Vicryl. I closed the subcutaneous tissues with 2-0 Vicryl and the subcuticular tissues with 3-0 Vicryl. The skin was then closed with benzoin and Steri-Strips. The drapes were removed, a sterile dressing was applied. The patient was awakened from general anesthesia and transferred to the recovery room in stable condition. At the end of the procedure all sponge, needle and instrument counts were correct.   PLAN OF CARE: Admit to inpatient   PATIENT DISPOSITION:  PACU - hemodynamically stable.   Delay start of Pharmacological VTE agent (>24hrs) due to surgical blood loss or risk of bleeding:  yes

## 2011-09-09 NOTE — Anesthesia Preprocedure Evaluation (Addendum)
Anesthesia Evaluation  Patient identified by MRN, date of birth, ID band Patient awake    Reviewed: Allergy & Precautions, H&P , NPO status   Airway Mallampati: II TM Distance: >3 FB Neck ROM: Full    Dental  (+) Teeth Intact   Pulmonary  breath sounds clear to auscultation        Cardiovascular Rhythm:Regular Rate:Normal     Neuro/Psych    GI/Hepatic   Endo/Other    Renal/GU      Musculoskeletal   Abdominal   Peds  Hematology   Anesthesia Other Findings   Reproductive/Obstetrics                           Anesthesia Physical Anesthesia Plan  ASA: III  Anesthesia Plan: General   Post-op Pain Management:    Induction: Intravenous  Airway Management Planned: Oral ETT  Additional Equipment:   Intra-op Plan:   Post-operative Plan: Extubation in OR  Informed Consent: I have reviewed the patients History and Physical, chart, labs and discussed the procedure including the risks, benefits and alternatives for the proposed anesthesia with the patient or authorized representative who has indicated his/her understanding and acceptance.   Dental advisory given  Plan Discussed with: CRNA and Surgeon  Anesthesia Plan Comments: (Htn CAD s/p MI and CABG 1996 No ischemia by nuclear stress test 09/08/07, Echo ef 45-50% 01/13/11 excellent exercise tolerance. GERD  Plan GA )       Anesthesia Quick Evaluation

## 2011-09-10 MED ORDER — HYDROCODONE-ACETAMINOPHEN 5-325 MG PO TABS: 1.0000 | ORAL_TABLET | ORAL | Status: AC | PRN

## 2011-09-10 MED FILL — Mupirocin Oint 2%: CUTANEOUS | Qty: 22 | Status: AC

## 2011-09-10 NOTE — Discharge Summary (Signed)
Physician Discharge Summary  Patient ID: Victor Estes MRN: [id number] DOB/AGE: [birth year] 66 y.o.  Admit date: 09/09/2011 Discharge date: 09/10/2011  Admission Diagnoses: L5 radiculopathy    Discharge Diagnoses: same   Discharged Condition: good  Hospital Course: The patient was admitted on 09/09/2011 and taken to the operating room where the patient underwent right L4-5 L5-S1 decompression. The patient tolerated the procedure well and was taken to the recovery room and then to the floor stable condition. The hospital course was routine. There were no complications. The wound remained clean dry and intact. The patient remained afebrile with stable vital signs, and tolerated a regular diet. The patient continued to increase activities, and pain was well controlled with oral pain medications.   Consults: None  Significant Diagnostic Studies:  Results for orders placed during the hospital encounter of 09/09/11  DIFFERENTIAL      Component Value Range   Neutrophils Relative 72  43 - 77 (%)   Neutro Abs 8.1 (*) 1.7 - 7.7 (K/uL)   Lymphocytes Relative 15  12 - 46 (%)   Lymphs Abs 1.7  0.7 - 4.0 (K/uL)   Monocytes Relative 10  3 - 12 (%)   Monocytes Absolute 1.1 (*) 0.1 - 1.0 (K/uL)   Eosinophils Relative 2  0 - 5 (%)   Eosinophils Absolute 0.3  0.0 - 0.7 (K/uL)   Basophils Relative 1  0 - 1 (%)   Basophils Absolute 0.1  0.0 - 0.1 (K/uL)  APTT      Component Value Range   aPTT 26  24 - 37 (seconds)    Dg Chest 2 View  09/09/2011  *RADIOLOGY REPORT*  Clinical Data: Preoperative respiratory evaluation prior to L5-S1 laminectomy.  History of MI with prior CABG.  CHEST - 2 VIEW 09/09/2011:  Comparison: None.  Findings: Prior sternotomy for CABG.  Cardiac silhouette mildly enlarged.  Hilar and mediastinal contours otherwise unremarkable. Lungs clear.  Bronchovascular markings normal.  No pleural effusions.  Mild degenerative changes involving the thoracic spine.  IMPRESSION: Mild  cardiomegaly.  No acute cardiopulmonary disease.  Original Report Authenticated By: Arnell Sieving, M.D.   Ct Lumbar Spine W Contrast  08/14/2011  *RADIOLOGY REPORT*  MYELOGRAM INJECTION  Technique:  Informed consent was obtained from the patient prior to the procedure, including potential complications of headache, allergy, infection and pain. Specific instructions were given regarding 24 hour bedrest post procedure to prevent post-LP headache.  A timeout procedure was performed.  With the patient prone, the lower back was prepped with Betadine.  1% Lidocaine was used for local anesthesia.  Lumbar puncture was performed by the radiologist at the L5-S1 level using a 22 gauge needle with return of clear CSF.  15 cc of Omnipaque 180 was injected into the subarachnoid space .  IMPRESSION: Successful injection of  intrathecal contrast for myelography.  MYELOGRAM LUMBAR  Clinical Data:  Low back pain.  Right leg pain.  Comparison: MRI lumbar spine 06/17/2011.  Findings: Good opacification lumbar subarachnoid space.  Mild disc space narrowing L1-L2.  Anatomic alignment.  No dynamic instability.  Shallow ventral defect L4-L5.  No nerve root cut off or spinal stenosis.  Fluoroscopy Time: 1.07 minutes  IMPRESSION: As above.  CT MYELOGRAPHY LUMBAR SPINE  Technique: Multidetector CT imaging of the lumbar spine was performed following myelography.  Multiplanar CT image reconstructions were also generated.  Findings:  No prevertebral or paraspinous masses.  L1-2: Disc space narrowing.  Mild bulge.  No neural compression.  L2-3:  Mild annular bulging.  Slight left foraminal narrowing due to disc material but no L2 or L3 nerve root encroachment.  No significant facet disease.  L3-4: Mild bulging.  Mild to moderate facet disease.  Foraminal and extraforaminal protrusion on the left.  Left L3 nerve root encroachment is possible.  L4-5: Mild central bulging annular fibers.  Mild facet arthropathy. Slight foraminal protrusion on  the left could irritate the L4 nerve root.  No L5 nerve root encroachment in the canal.  L5-S1: Shallow central protrusion extends more to the right, narrowing the foramen in conjunction with asymmetric right-sided facet arthropathy.  There is no S1 root compression in the canal, but the right L5 nerve root may be irritated.  The left neural foramen appears moderately narrowed but not to the same degree as the right.  Compared with prior MRI, there is good general agreement.  IMPRESSION: The dominant right-sided abnormality is at L5-S1 where a central and rightward protrusion extending into the foramen along with asymmetric facet arthropathy compresses the right L5 nerve root; right S1 nerve root appears unaffected.  Relatively minor extraforaminal disc pathology extending to the left at L3-4 and L4-5 without significant stenosis.  See comments above.  Original Report Authenticated By: Elsie Stain, M.D.   Dg Myelogram Lumbar  08/14/2011  *RADIOLOGY REPORT*  MYELOGRAM INJECTION  Technique:  Informed consent was obtained from the patient prior to the procedure, including potential complications of headache, allergy, infection and pain. Specific instructions were given regarding 24 hour bedrest post procedure to prevent post-LP headache.  A timeout procedure was performed.  With the patient prone, the lower back was prepped with Betadine.  1% Lidocaine was used for local anesthesia.  Lumbar puncture was performed by the radiologist at the L5-S1 level using a 22 gauge needle with return of clear CSF.  15 cc of Omnipaque 180 was injected into the subarachnoid space .  IMPRESSION: Successful injection of  intrathecal contrast for myelography.  MYELOGRAM LUMBAR  Clinical Data:  Low back pain.  Right leg pain.  Comparison: MRI lumbar spine 06/17/2011.  Findings: Good opacification lumbar subarachnoid space.  Mild disc space narrowing L1-L2.  Anatomic alignment.  No dynamic instability.  Shallow ventral defect L4-L5.   No nerve root cut off or spinal stenosis.  Fluoroscopy Time: 1.07 minutes  IMPRESSION: As above.  CT MYELOGRAPHY LUMBAR SPINE  Technique: Multidetector CT imaging of the lumbar spine was performed following myelography.  Multiplanar CT image reconstructions were also generated.  Findings:  No prevertebral or paraspinous masses.  L1-2: Disc space narrowing.  Mild bulge.  No neural compression.  L2-3: Mild annular bulging.  Slight left foraminal narrowing due to disc material but no L2 or L3 nerve root encroachment.  No significant facet disease.  L3-4: Mild bulging.  Mild to moderate facet disease.  Foraminal and extraforaminal protrusion on the left.  Left L3 nerve root encroachment is possible.  L4-5: Mild central bulging annular fibers.  Mild facet arthropathy. Slight foraminal protrusion on the left could irritate the L4 nerve root.  No L5 nerve root encroachment in the canal.  L5-S1: Shallow central protrusion extends more to the right, narrowing the foramen in conjunction with asymmetric right-sided facet arthropathy.  There is no S1 root compression in the canal, but the right L5 nerve root may be irritated.  The left neural foramen appears moderately narrowed but not to the same degree as the right.  Compared with prior MRI, there is good general agreement.  IMPRESSION:  The dominant right-sided abnormality is at L5-S1 where a central and rightward protrusion extending into the foramen along with asymmetric facet arthropathy compresses the right L5 nerve root; right S1 nerve root appears unaffected.  Relatively minor extraforaminal disc pathology extending to the left at L3-4 and L4-5 without significant stenosis.  See comments above.  Original Report Authenticated By: Elsie Stain, M.D.   Dg Lumbar Spine 1 View  09/09/2011  *RADIOLOGY REPORT*  Clinical Data: Lumbar laminectomy and microdiskectomy.  OPERATIVE LUMBAR SPINE - 1 VIEW 09/09/2011:  Comparison: CT myelogram 08/14/2011.  Findings: The same  numbering scheme will be utilized as on the prior CT myelogram, with L5 representing the last lumbar segment. Single image obtained at 1135 hours and submitted for interpretation post-operatively demonstrates the localizer device directed toward the L4-5 disc space.  IMPRESSION: L4-5 localized intraoperatively.  Original Report Authenticated By: Arnell Sieving, M.D.    Antibiotics:  Anti-infectives     Start     Dose/Rate Route Frequency Ordered Stop   09/09/11 1500   ceFAZolin (ANCEF) IVPB 1 g/50 mL premix        1 g 100 mL/hr over 30 Minutes Intravenous Every 8 hours 09/09/11 1400 09/09/11 2304   09/09/11 1154   bacitracin 50,000 Units in sodium chloride irrigation 0.9 % 500 mL irrigation  Status:  Discontinued          As needed 09/09/11 1154 09/09/11 1300   09/09/11 1028   bacitracin 91478 UNITS injection     Comments: RATCLIFF, ESTHER: cabinet override         09/09/11 1028 09/09/11 2229   09/09/11 0923   ceFAZolin (ANCEF) 2-3 GM-% IVPB SOLR     Comments: Love, Lori: cabinet override         09/09/11 0923 09/09/11 2129   09/09/11 0919   ceFAZolin (ANCEF) IVPB 2 g/50 mL premix  Status:  Discontinued        2 g 100 mL/hr over 30 Minutes Intravenous 30 min pre-op 09/09/11 0919 09/09/11 1339          Discharge Exam: Blood pressure 121/59, pulse 86, temperature 97.9 F (36.6 C), temperature source Oral, resp. rate 18, SpO2 94.00%. Neurologic: Grossly normal  Discharge Medications:   Medication List  As of 09/10/2011  7:34 AM   TAKE these medications         acetaminophen 325 MG tablet   Commonly known as: TYLENOL   Take 650 mg by mouth every 6 (six) hours as needed.      aspirin 325 MG tablet   Take 325 mg by mouth daily.      clopidogrel 75 MG tablet   Commonly known as: PLAVIX   Take 75 mg by mouth daily.      doxazosin 2 MG tablet   Commonly known as: CARDURA   Take 2 mg by mouth at bedtime.      ezetimibe 10 MG tablet   Commonly known as: ZETIA   Take 10  mg by mouth daily.      HYDROcodone-acetaminophen 5-325 MG per tablet   Commonly known as: NORCO   Take 1-2 tablets by mouth every 4 (four) hours as needed.      lisinopril 20 MG tablet   Commonly known as: PRINIVIL,ZESTRIL   Take 20 mg by mouth daily.      metoprolol tartrate 25 MG tablet   Commonly known as: LOPRESSOR   Take 25 mg by mouth 2 (two) times daily.  omega-3 acid ethyl esters 1 G capsule   Commonly known as: LOVAZA   Take 2 g by mouth daily.      pantoprazole 40 MG tablet   Commonly known as: PROTONIX   Take 40 mg by mouth daily.      PRESCRIPTION MEDICATION   Take 1 tablet by mouth 4 (four) times daily. ALOH 160MG  , CARB 105MG  CHEWABLE TABLETS (LIKE A TUMS)      simvastatin 80 MG tablet   Commonly known as: ZOCOR   Take 80 mg by mouth at bedtime.      traMADol 50 MG tablet   Commonly known as: ULTRAM   Take 50 mg by mouth every 6 (six) hours as needed. Maximum dose= 8 tablets per day            Disposition: home  Final Dx: LL L4-5 L5-S1 Discharge Orders    Future Orders Please Complete By Expires   Diet - low sodium heart healthy      Increase activity slowly      Call MD for:  temperature >100.4      Call MD for:  persistant nausea and vomiting      Call MD for:  severe uncontrolled pain      Call MD for:  redness, tenderness, or signs of infection (pain, swelling, redness, odor or green/yellow discharge around incision site)      Call MD for:  difficulty breathing, headache or visual disturbances      Remove dressing in 48 hours      Driving Restrictions      Comments:   1 week   Lifting restrictions      Comments:   Less than 10 lbs      Follow-up Information    Follow up with Azell Bill S, MD. Schedule an appointment as soon as possible for a visit in 2 weeks.   Contact information:   [address] [place] [address] [phone]           Signed: Tia Alert 09/10/2011, 7:34 AM

## 2011-09-10 NOTE — Discharge Instructions (Signed)
Wound Care °Keep incision covered and dry for one week.  If you shower prior to then, cover incision with plastic wrap.  °You may remove outer bandage after one week and shower.  °Do not put any creams, lotions, or ointments on incision. °Leave steri-strips on neck.  They will fall off by themselves. °Activity °Walk each and every day, increasing distance each day. °No lifting greater than 5 lbs.  Avoid bending, arching, or twisting. °No driving for 2 weeks; may ride as a passenger locally. °If provided with back brace, wear when out of bed.  It is not necessary to wear in bed. °Diet °Resume your normal diet.  °Return to Work °Will be discussed at you follow up appointment. °Call Your Doctor If Any of These Occur °Redness, drainage, or swelling at the wound.  °Temperature greater than 101 degrees. °Severe pain not relieved by pain medication. °Incision starts to come apart. °Follow Up Appt °Call today for appointment in 1-2 weeks ([phone]) or for problems.  If you have any hardware placed in your spine, you will need an x-ray before your appointment. °

## 2011-09-14 ENCOUNTER — Encounter (HOSPITAL_COMMUNITY): Payer: Self-pay | Admitting: Neurological Surgery

## 2011-10-20 DIAGNOSIS — M5430 Sciatica, unspecified side: Secondary | ICD-10-CM | POA: Diagnosis not present

## 2011-10-20 DIAGNOSIS — M1710 Unilateral primary osteoarthritis, unspecified knee: Secondary | ICD-10-CM | POA: Diagnosis not present

## 2011-12-14 DIAGNOSIS — M25569 Pain in unspecified knee: Secondary | ICD-10-CM | POA: Diagnosis not present

## 2011-12-14 DIAGNOSIS — M1710 Unilateral primary osteoarthritis, unspecified knee: Secondary | ICD-10-CM | POA: Diagnosis not present

## 2011-12-21 DIAGNOSIS — I6529 Occlusion and stenosis of unspecified carotid artery: Secondary | ICD-10-CM | POA: Diagnosis not present

## 2012-01-05 DIAGNOSIS — M1710 Unilateral primary osteoarthritis, unspecified knee: Secondary | ICD-10-CM | POA: Diagnosis not present

## 2012-01-06 DIAGNOSIS — E782 Mixed hyperlipidemia: Secondary | ICD-10-CM | POA: Diagnosis not present

## 2012-01-06 DIAGNOSIS — I2510 Atherosclerotic heart disease of native coronary artery without angina pectoris: Secondary | ICD-10-CM | POA: Diagnosis not present

## 2012-01-28 DIAGNOSIS — M5430 Sciatica, unspecified side: Secondary | ICD-10-CM | POA: Diagnosis not present

## 2012-01-28 DIAGNOSIS — M1710 Unilateral primary osteoarthritis, unspecified knee: Secondary | ICD-10-CM | POA: Diagnosis not present

## 2012-07-05 DIAGNOSIS — M1710 Unilateral primary osteoarthritis, unspecified knee: Secondary | ICD-10-CM | POA: Diagnosis not present

## 2012-08-10 DIAGNOSIS — M19029 Primary osteoarthritis, unspecified elbow: Secondary | ICD-10-CM | POA: Diagnosis not present

## 2012-09-02 ENCOUNTER — Emergency Department (HOSPITAL_COMMUNITY)
Admission: EM | Admit: 2012-09-02 | Discharge: 2012-09-02 | Disposition: A | Discharge status: Home / Self Care | Payer: Medicare Other | Attending: Emergency Medicine | Admitting: Emergency Medicine

## 2012-09-02 ENCOUNTER — Encounter (HOSPITAL_COMMUNITY): Payer: Self-pay | Admitting: Emergency Medicine

## 2012-09-02 DIAGNOSIS — C801 Malignant (primary) neoplasm, unspecified: Secondary | ICD-10-CM | POA: Insufficient documentation

## 2012-09-02 DIAGNOSIS — Z7982 Long term (current) use of aspirin: Secondary | ICD-10-CM | POA: Insufficient documentation

## 2012-09-02 DIAGNOSIS — E785 Hyperlipidemia, unspecified: Secondary | ICD-10-CM | POA: Insufficient documentation

## 2012-09-02 DIAGNOSIS — I10 Essential (primary) hypertension: Secondary | ICD-10-CM | POA: Diagnosis not present

## 2012-09-02 DIAGNOSIS — I252 Old myocardial infarction: Secondary | ICD-10-CM | POA: Insufficient documentation

## 2012-09-02 DIAGNOSIS — S01119A Laceration without foreign body of unspecified eyelid and periocular area, initial encounter: Secondary | ICD-10-CM | POA: Diagnosis not present

## 2012-09-02 DIAGNOSIS — W2209XA Striking against other stationary object, initial encounter: Secondary | ICD-10-CM | POA: Insufficient documentation

## 2012-09-02 DIAGNOSIS — S058X9A Other injuries of unspecified eye and orbit, initial encounter: Secondary | ICD-10-CM | POA: Diagnosis not present

## 2012-09-02 DIAGNOSIS — K219 Gastro-esophageal reflux disease without esophagitis: Secondary | ICD-10-CM | POA: Diagnosis not present

## 2012-09-02 DIAGNOSIS — S0181XA Laceration without foreign body of other part of head, initial encounter: Secondary | ICD-10-CM

## 2012-09-02 DIAGNOSIS — Z79899 Other long term (current) drug therapy: Secondary | ICD-10-CM | POA: Diagnosis not present

## 2012-09-02 DIAGNOSIS — Y929 Unspecified place or not applicable: Secondary | ICD-10-CM | POA: Insufficient documentation

## 2012-09-02 DIAGNOSIS — Z951 Presence of aortocoronary bypass graft: Secondary | ICD-10-CM | POA: Insufficient documentation

## 2012-09-02 DIAGNOSIS — Y9389 Activity, other specified: Secondary | ICD-10-CM | POA: Insufficient documentation

## 2012-09-02 HISTORY — DX: Hyperlipidemia, unspecified: E78.5

## 2012-09-02 NOTE — ED Provider Notes (Signed)
Medical screening examination/treatment/procedure(s) were performed by non-physician practitioner and as supervising physician I was immediately available for consultation/collaboration. Iva Knapp, MD, FACEP   Iva L Knapp, MD 09/02/12 1510 

## 2012-09-02 NOTE — ED Provider Notes (Signed)
History     CSN: [id number]  Arrival date & time 09/02/12  1251   First MD Initiated Contact with Patient 09/02/12 1300      Chief Complaint  Patient presents with  . Laceration    laceration over l/eyebrow. 3.5cm shallow laceration note, bleeding controlled    (Consider location/radiation/quality/duration/timing/severity/associated sxs/prior treatment) HPI  67 year old male presents for evaluations of eyebrow injury. Patient reportedly was walking a dark room and accidentally struck his head on a steel beam.  He suffered a laceration right above his L eye, below the eyebrow.  Denies eye injury, changes in vision or having significant pain.  Incident happened 1 hr ago.  Currently taking Plavix.  Denies LOC.  Denies neck pain, or pain with eye movement.    Past Medical History  Diagnosis Date  . Myocardial infarct   . Cancer   . Hypertension   . GERD (gastroesophageal reflux disease)     Past Surgical History  Procedure Laterality Date  . Cardiac surgery    . Colon surgery    . Elbow surgery    . Coronary artery bypass graft    . Lumbar laminectomy/decompression microdiscectomy  09/09/2011    Procedure: LUMBAR LAMINECTOMY/DECOMPRESSION MICRODISCECTOMY 1 LEVEL;  Surgeon: Tia Alert, MD;  Location: [facility];  Service: Neurosurgery;  Laterality: Right;  Right Lumbar Five-Sacral One Hemilaminectomy      Family History  Problem Relation Age of Onset  . Hypertension Other     History  Substance Use Topics  . Smoking status: Never Smoker   . Smokeless tobacco: Not on file  . Alcohol Use: No      Review of Systems  Constitutional: Negative for fever.  HENT: Negative for ear pain and neck pain.   Eyes: Negative for pain and visual disturbance.  Skin: Positive for wound.  Neurological: Negative for headaches.    Allergies  Clarithromycin  Home Medications   Current Outpatient Rx  Name  Route  Sig  Dispense  Refill  . acetaminophen (TYLENOL) 325 MG  tablet   Oral   Take 650 mg by mouth every 6 (six) hours as needed.           [name] aspirin 325 MG tablet   Oral   Take 325 mg by mouth daily.           . clopidogrel (PLAVIX) 75 MG tablet   Oral   Take 75 mg by mouth daily.           [name] doxazosin (CARDURA) 2 MG tablet   Oral   Take 2 mg by mouth at bedtime.           [name] ezetimibe (ZETIA) 10 MG tablet   Oral   Take 10 mg by mouth daily.           . metoprolol tartrate (LOPRESSOR) 25 MG tablet   Oral   Take 25 mg by mouth 2 (two) times daily.          [name] omega-3 acid ethyl esters (LOVAZA) 1 G capsule   Oral   Take 2 g by mouth daily.          . pantoprazole (PROTONIX) 40 MG tablet   Oral   Take 40 mg by mouth daily.           [name] PRESCRIPTION MEDICATION   Oral   Take 1 tablet by mouth 4 (four) times daily. ALOH 160MG  , CARB 105MG  CHEWABLE TABLETS (LIKE A TUMS)         .  simvastatin (ZOCOR) 80 MG tablet   Oral   Take 80 mg by mouth at bedtime.           . traMADol (ULTRAM) 50 MG tablet   Oral   Take 50 mg by mouth every 6 (six) hours as needed. Maximum dose= 8 tablets per day            There were no vitals taken for this visit.  Physical Exam  Nursing note and vitals reviewed. Constitutional: He is oriented to person, place, and time. He appears well-nourished. No distress.  HENT:  Head: Normocephalic.  Right Ear: External ear normal.  Left Ear: External ear normal.  L upper eyelid: 3cm horizontal superficial laceration below eyebrow without active bleeding, or fb.    Eyes: Conjunctivae and EOM are normal. Pupils are equal, round, and reactive to light. Right eye exhibits no discharge. Left eye exhibits no discharge. No scleral icterus.  No pain with eye movement.    Neck: Normal range of motion. Neck supple.  Neurological: He is alert and oriented to person, place, and time.  Skin: Skin is warm. No rash noted.  Psychiatric: He has a normal mood and affect.    ED Course  Procedures  (including critical care time)  LACERATION REPAIR Performed by: Fayrene Helper Authorized by: Fayrene Helper Consent: Verbal consent obtained. Risks and benefits: risks, benefits and alternatives were discussed Consent given by: patient Patient identity confirmed: provided demographic data Prepped and Draped in normal sterile fashion Wound explored  Laceration Location: L upper eyelid  Laceration Length: 3cm  No Foreign Bodies seen or palpated  Anesthesia: local infiltration  Local anesthetic: lidocaine 2% w epinephrine  Anesthetic total: 1 ml  Irrigation method: syringe Amount of cleaning: standard  Skin closure: dermabond  Number of sutures: dermabond  Technique: dermabond  Patient tolerance: Patient tolerated the procedure well with no immediate complications.   Labs Reviewed - No data to display No results found.   No diagnosis found.  14:04:57 Visual Acuity AG Visual Acuity - Bilateral Distance: 20/40 ; R Distance: 20/50 ; L Distance: 20/50   1:34 PM Pt suffered a superficial laceration below L eyebrow without eye involvement.  Wound will be irrigated and dermabond as appropriate.  Tetanus is UTD.    BP 145/81  Pulse 68  Temp(Src) 97.6 F (36.4 C) (Oral)  SpO2 96%   1. Facial trauma 2. Skin laceration, face MDM         Fayrene Helper, PA-C 09/02/12 1452

## 2012-09-02 NOTE — ED Notes (Signed)
Pt was walking in a dark room, struck head on steel beam. 3.5 cm laceration noted over l/eyebrow. Denies LOC or blurred vision

## 2012-10-18 DIAGNOSIS — M1710 Unilateral primary osteoarthritis, unspecified knee: Secondary | ICD-10-CM | POA: Diagnosis not present

## 2012-12-21 ENCOUNTER — Other Ambulatory Visit: Payer: Self-pay | Admitting: Cardiovascular Disease

## 2012-12-22 NOTE — Telephone Encounter (Signed)
Rx was sent to pharmacy electronically. 

## 2013-03-10 ENCOUNTER — Other Ambulatory Visit: Payer: Self-pay | Admitting: Cardiovascular Disease

## 2013-03-13 NOTE — Telephone Encounter (Signed)
Rx was sent to pharmacy electronically. 

## 2013-03-14 DIAGNOSIS — M1710 Unilateral primary osteoarthritis, unspecified knee: Secondary | ICD-10-CM | POA: Diagnosis not present

## 2013-03-14 DIAGNOSIS — M533 Sacrococcygeal disorders, not elsewhere classified: Secondary | ICD-10-CM | POA: Diagnosis not present

## 2013-03-21 DIAGNOSIS — M1710 Unilateral primary osteoarthritis, unspecified knee: Secondary | ICD-10-CM | POA: Diagnosis not present

## 2013-03-28 ENCOUNTER — Other Ambulatory Visit: Payer: Self-pay | Admitting: Cardiovascular Disease

## 2013-03-28 NOTE — Telephone Encounter (Signed)
Rx was sent to pharmacy electronically. 

## 2013-04-05 ENCOUNTER — Encounter: Payer: Self-pay | Admitting: Cardiovascular Disease

## 2013-04-05 ENCOUNTER — Encounter: Payer: Self-pay | Admitting: *Deleted

## 2013-04-05 ENCOUNTER — Other Ambulatory Visit: Payer: Self-pay | Admitting: *Deleted

## 2013-04-05 ENCOUNTER — Ambulatory Visit (INDEPENDENT_AMBULATORY_CARE_PROVIDER_SITE_OTHER): Payer: Medicare Other | Admitting: Cardiovascular Disease

## 2013-04-05 VITALS — BP 120/70 | HR 47 | Ht 69.0 in | Wt 191.6 lb

## 2013-04-05 DIAGNOSIS — I10 Essential (primary) hypertension: Secondary | ICD-10-CM | POA: Diagnosis not present

## 2013-04-05 DIAGNOSIS — I779 Disorder of arteries and arterioles, unspecified: Secondary | ICD-10-CM

## 2013-04-05 DIAGNOSIS — I2510 Atherosclerotic heart disease of native coronary artery without angina pectoris: Secondary | ICD-10-CM | POA: Diagnosis not present

## 2013-04-05 DIAGNOSIS — E785 Hyperlipidemia, unspecified: Secondary | ICD-10-CM | POA: Insufficient documentation

## 2013-04-05 DIAGNOSIS — Z01810 Encounter for preprocedural cardiovascular examination: Secondary | ICD-10-CM

## 2013-04-05 MED ORDER — CLOPIDOGREL BISULFATE 75 MG PO TABS: 75.0000 mg | ORAL_TABLET | Freq: Every day | ORAL | Status: DC

## 2013-04-05 MED ORDER — EZETIMIBE 10 MG PO TABS: 10.0000 mg | ORAL_TABLET | Freq: Every day | ORAL | Status: DC

## 2013-04-05 NOTE — Assessment & Plan Note (Signed)
On statin therapy with recent lipid profile performed by the [facility] reveal a glucose of 141, LDL 51 and HDL of 63

## 2013-04-05 NOTE — Patient Instructions (Signed)
Your physician has requested that you have a carotid duplex. This test is an ultrasound of the carotid arteries in your neck. It looks at blood flow through these arteries that supply the brain with blood. Allow one hour for this exam. There are no restrictions or special instructions.  Your physician has requested that you have a lexiscan myoview. For further information please visit [email or web address] Please follow instruction sheet, as given.  Your physician wants you to follow-up in: 1 year with Dr. Allyson Sabal. You will receive a reminder letter in the mail two months in advance. If you don't receive a letter, please call our office to schedule the follow-up appointment.

## 2013-04-05 NOTE — Progress Notes (Signed)
04/05/2013 Victor Estes   [birth year]  [id number]  Primary Physician No primary provider on file. Primary Cardiologist: Runell Gess MD Roseanne Reno   HPI:  The patient is a delightful 67 year old, mildly overweight, married Caucasian male, father of 2, grandfather to 5 grandchildren who I have been taking care of for the last 20 years. He had an MI back in 1993 and subsequent coronary artery bypass grafting x4 in 1995. His risk factors are remarkable for remote tobacco abuse, hypertension, hyperlipidemia, and family history. His last Myoview performed 1 year ago showed inferior scar without ischemia, unchanged from prior studies. A 2D echo revealed an EF of 45% to 50% with inferior and septal hypokinesia. Carotid Dopplers showed moderate right ICA stenosis unchanged from prior studies. He is neurologically asymptomatic. Recent lab work performed [facility] in [place] 03/31/13 revealed a glucose of 141, LDL 61 HDL 63. He denies chest pain or shortness of breath. He is scheduled for elective (replacement by Dr. Eulah Pont 07/05/13. I am going to get a dermatologic Myoview stress test to risk stratify him   Current Outpatient Prescriptions  Medication Sig Dispense Refill  . acetaminophen (TYLENOL) 325 MG tablet Take 650 mg by mouth every 6 (six) hours as needed.        [name] aspirin 81 MG tablet Take 81 mg by mouth daily.      . clopidogrel (PLAVIX) 75 MG tablet Take 1 tablet (75 mg total) by mouth daily.  15 tablet  0  . lisinopril (PRINIVIL,ZESTRIL) 40 MG tablet Take 20 mg by mouth every morning.      . metoprolol tartrate (LOPRESSOR) 25 MG tablet Take 25 mg by mouth 2 (two) times daily.       . pantoprazole (PROTONIX) 40 MG tablet Take 40 mg by mouth every morning.       [name] PRESCRIPTION MEDICATION Take 1 tablet by mouth 4 (four) times daily. ALOH 160MG  , CARB 105MG  CHEWABLE TABLETS (LIKE A TUMS)      . simvastatin (ZOCOR) 80 MG tablet Take 80 mg by mouth at bedtime.         . tamsulosin (FLOMAX) 0.4 MG CAPS capsule Take 0.4 mg by mouth daily after breakfast.      . traMADol (ULTRAM) 50 MG tablet Take 50 mg by mouth every 6 (six) hours as needed. Maximum dose= 8 tablets per day       . ZETIA 10 MG tablet TAKE 1 TABLET BY MOUTH DAILY  15 tablet  0   No current facility-administered medications for this visit.    Allergies  Allergen Reactions  . Clarithromycin Swelling    History   Social History  . Marital Status: Married    Spouse Name: N/A    Number of Children: N/A  . Years of Education: N/A   Occupational History  . Not on file.   Social History Main Topics  . Smoking status: Former Smoker    Quit date: 04/06/1983  . Smokeless tobacco: Not on file  . Alcohol Use: No  . Drug Use: No  . Sexual Activity: Not on file   Other Topics Concern  . Not on file   Social History Narrative  . No narrative on file     Review of Systems: General: negative for chills, fever, night sweats or weight changes.  Cardiovascular: negative for chest pain, dyspnea on exertion, edema, orthopnea, palpitations, paroxysmal nocturnal dyspnea or shortness of breath Dermatological: negative for rash Respiratory: negative  for cough or wheezing Urologic: negative for hematuria Abdominal: negative for nausea, vomiting, diarrhea, bright red blood per rectum, melena, or hematemesis Neurologic: negative for visual changes, syncope, or dizziness All other systems reviewed and are otherwise negative except as noted above.    Blood pressure 120/70, pulse 47, height 5\' 9"  (1.753 m), weight 191 lb 9.6 oz (86.909 kg).  General appearance: alert Neck: no adenopathy, no carotid bruit, no JVD, supple, symmetrical, trachea midline and thyroid not enlarged, symmetric, no tenderness/mass/nodules Lungs: clear to auscultation bilaterally Heart: regular rate and rhythm, S1, S2 normal, no murmur, click, rub or gallop Extremities: extremities normal, atraumatic, no cyanosis or  edema  EKG sinus bradycardia 47 with first degree AV block  ASSESSMENT AND PLAN:   Coronary artery disease He had a myocardial infarction in 1993 and subsequently underwent coronary artery bypass grafting x4 in 1995. He has low normal LV function by 2-D echo. My Myoview performed 12/30/10 revealed inferior scar without ischemia. He denies chest pain or shortness of breath. He scheduled for elective left total knee replacement by Dr. Eulah Pont in January of next year. I'm going to get a pharmacologic Myoview stress test to risk stratify him.  Essential hypertension Well-controlled on current medications  Hyperlipidemia On statin therapy with recent lipid profile performed by the [facility] reveal a glucose of 141, LDL 51 and HDL of 63  Carotid artery disease Moderate right ICA stenosis by duplex ultrasound over a year ago. I'm going to repeat this. He is neurologically symptomatic on aspirin and Plavix.      Runell Gess MD FACP,FACC,FAHA, [facility] 04/05/2013 5:27 PM

## 2013-04-05 NOTE — Assessment & Plan Note (Signed)
Moderate right ICA stenosis by duplex ultrasound over a year ago. I'm going to repeat this. He is neurologically symptomatic on aspirin and Plavix.

## 2013-04-05 NOTE — Assessment & Plan Note (Signed)
He had a myocardial infarction in 1993 and subsequently underwent coronary artery bypass grafting x4 in 1995. He has low normal LV function by 2-D echo. My Myoview performed 12/30/10 revealed inferior scar without ischemia. He denies chest pain or shortness of breath. He scheduled for elective left total knee replacement by Dr. Eulah Pont in January of next year. I'm going to get a pharmacologic Myoview stress test to risk stratify him.

## 2013-04-05 NOTE — Assessment & Plan Note (Signed)
Well-controlled on current medications 

## 2013-04-05 NOTE — Telephone Encounter (Signed)
Rx was sent to pharmacy electronically. 

## 2013-04-06 ENCOUNTER — Encounter: Payer: Self-pay | Admitting: Cardiovascular Disease

## 2013-04-06 ENCOUNTER — Telehealth (HOSPITAL_COMMUNITY): Payer: Self-pay | Admitting: *Deleted

## 2013-04-10 ENCOUNTER — Telehealth (HOSPITAL_COMMUNITY): Payer: Self-pay | Admitting: *Deleted

## 2013-04-10 DIAGNOSIS — M67919 Unspecified disorder of synovium and tendon, unspecified shoulder: Secondary | ICD-10-CM | POA: Diagnosis not present

## 2013-04-18 ENCOUNTER — Ambulatory Visit (HOSPITAL_COMMUNITY)
Admission: RE | Admit: 2013-04-18 | Discharge: 2013-04-18 | Disposition: A | Discharge status: Home / Self Care | Payer: Medicare Other | Source: Ambulatory Visit | Attending: Cardiovascular Disease | Admitting: Cardiovascular Disease

## 2013-04-18 DIAGNOSIS — E663 Overweight: Secondary | ICD-10-CM | POA: Diagnosis not present

## 2013-04-18 DIAGNOSIS — Z87891 Personal history of nicotine dependence: Secondary | ICD-10-CM | POA: Insufficient documentation

## 2013-04-18 DIAGNOSIS — Z01810 Encounter for preprocedural cardiovascular examination: Secondary | ICD-10-CM | POA: Insufficient documentation

## 2013-04-18 DIAGNOSIS — R0989 Other specified symptoms and signs involving the circulatory and respiratory systems: Secondary | ICD-10-CM | POA: Insufficient documentation

## 2013-04-18 DIAGNOSIS — R5381 Other malaise: Secondary | ICD-10-CM | POA: Insufficient documentation

## 2013-04-18 DIAGNOSIS — Z8249 Family history of ischemic heart disease and other diseases of the circulatory system: Secondary | ICD-10-CM | POA: Insufficient documentation

## 2013-04-18 DIAGNOSIS — R0609 Other forms of dyspnea: Secondary | ICD-10-CM | POA: Diagnosis not present

## 2013-04-18 DIAGNOSIS — I252 Old myocardial infarction: Secondary | ICD-10-CM | POA: Insufficient documentation

## 2013-04-18 DIAGNOSIS — Z951 Presence of aortocoronary bypass graft: Secondary | ICD-10-CM | POA: Insufficient documentation

## 2013-04-18 DIAGNOSIS — I10 Essential (primary) hypertension: Secondary | ICD-10-CM | POA: Insufficient documentation

## 2013-04-18 DIAGNOSIS — I2589 Other forms of chronic ischemic heart disease: Secondary | ICD-10-CM | POA: Diagnosis not present

## 2013-04-18 DIAGNOSIS — I2510 Atherosclerotic heart disease of native coronary artery without angina pectoris: Secondary | ICD-10-CM

## 2013-04-18 MED ORDER — TECHNETIUM TC 99M SESTAMIBI GENERIC - CARDIOLITE
29.7000 | Freq: Once | INTRAVENOUS | Status: AC | PRN
  Administered 2013-04-18: 30 via INTRAVENOUS

## 2013-04-18 MED ORDER — REGADENOSON 0.4 MG/5ML IV SOLN
0.4000 mg | Freq: Once | INTRAVENOUS | Status: AC
  Administered 2013-04-18: 0.4 mg via INTRAVENOUS

## 2013-04-18 MED ORDER — TECHNETIUM TC 99M SESTAMIBI GENERIC - CARDIOLITE
10.9000 | Freq: Once | INTRAVENOUS | Status: AC | PRN
  Administered 2013-04-18: 10.9 via INTRAVENOUS

## 2013-04-18 NOTE — Procedures (Addendum)
[facility] [address] [place] [address] [phone]  Cardiology Nuclear Med Study  JAELON GATLEY is a 67 y.o. male     MRN : [id number]     DOB: 1946-03-18  Procedure Date: 04/18/2013  Nuclear Med Background Indication for Stress Test:  Surgical Clearance History:  CAD;MI--1993;CABG X4--1995 Cardiac Risk Factors: Family History - CAD, History of Smoking, Hypertension, Lipids and Overweight  Symptoms:  DOE and Fatigue   Nuclear Pre-Procedure Caffeine/Decaff Intake:  7:00pm NPO After: 5:00am   IV Site: R Hand  IV 0.9% NS with Angio Cath:  22g  Chest Size (in):  42"  IV Started by: Emmit Pomfret, RN  Height: 5\' 9"  (1.753 m)  Cup Size: n/a  BMI:  Body mass index is 28.19 kg/(m^2). Weight:  191 lb (86.637 kg)   Tech Comments:  N/A    Nuclear Med Study 1 or 2 day study: 1 day  Stress Test Type:  Lexiscan  Order Authorizing Provider:  Nanetta Batty, MD   Resting Radionuclide: Technetium 83m Sestamibi  Resting Radionuclide Dose: 10.9 mCi   Stress Radionuclide:  Technetium 55m Sestamibi  Stress Radionuclide Dose: 29.7 mCi           Stress Protocol Rest HR: 44 Stress HR: 68  Rest BP: 136/72 Stress BP: 120/62  Exercise Time (min): n/a METS: n/a          Dose of Adenosine (mg):  n/a Dose of Lexiscan: 0.4 mg  Dose of Atropine (mg): n/a Dose of Dobutamine: n/a mcg/kg/min (at max HR)  Stress Test Technologist: Ernestene Mention, CCT Nuclear Technologist: Gonzella Lex, CNMT   Rest Procedure:  Myocardial perfusion imaging was performed at rest 45 minutes following the intravenous administration of Technetium 88m Sestamibi. Stress Procedure:  The patient received IV Lexiscan 0.4 mg over 15-seconds.  Technetium 61m Sestamibi injected at 30-seconds.  There were no significant changes with Lexiscan.  Quantitative spect images were obtained after a 45 minute delay.  Transient Ischemic Dilatation (Normal <1.22):   0.92 Lung/Heart Ratio (Normal <0.45):  0.31 QGS EDV:  136 ml QGS ESV:  85 ml LV Ejection Fraction: 38%     Rest ECG: Sinus bradycardia, old inferior MI, Nonspecific ST-T changes  Stress ECG: No significant change from baseline ECG  QPS Raw Data Images:  Normal; no motion artifact; normal heart/lung ratio. Stress Images:  There is decreased uptake in the inferior wall. Rest Images:  Comparison with the stress images reveals no significant change. Subtraction (SDS):  There is a fixed defect that is most consistent with a previous infarction. LV Wall Motion:  Inferior wall hypokinesis, moderately depressed LV systolic function (estimated EF 38%)  Impression Exercise Capacity:  Lexiscan with no exercise. BP Response:  Normal blood pressure response. Clinical Symptoms:  No significant symptoms noted. ECG Impression:  No significant ST segment change suggestive of ischemia. Comparison with Prior Nuclear Study: Compared to the previous study, the perfusion abnormality is unchanged, but LVEF has deteriorated singificantly   Overall Impression:  Low risk stress nuclear study with interval worsening of ischemic cardiomyopathy.   Thurmon Fair, MD  04/18/2013 1:14 PM

## 2013-04-25 ENCOUNTER — Ambulatory Visit (HOSPITAL_COMMUNITY)
Admission: RE | Admit: 2013-04-25 | Discharge: 2013-04-25 | Disposition: A | Discharge status: Home / Self Care | Payer: Medicare Other | Source: Ambulatory Visit | Attending: Cardiovascular Disease | Admitting: Cardiovascular Disease

## 2013-04-25 DIAGNOSIS — I6529 Occlusion and stenosis of unspecified carotid artery: Secondary | ICD-10-CM | POA: Insufficient documentation

## 2013-04-25 DIAGNOSIS — I779 Disorder of arteries and arterioles, unspecified: Secondary | ICD-10-CM | POA: Diagnosis not present

## 2013-04-25 DIAGNOSIS — I658 Occlusion and stenosis of other precerebral arteries: Secondary | ICD-10-CM | POA: Diagnosis not present

## 2013-04-25 NOTE — Progress Notes (Signed)
Carotid Duplex Completed. °Brianna L Mazza,RVT °

## 2013-04-26 ENCOUNTER — Telehealth: Payer: Self-pay | Admitting: Cardiovascular Disease

## 2013-04-26 DIAGNOSIS — I255 Ischemic cardiomyopathy: Secondary | ICD-10-CM

## 2013-04-26 NOTE — Telephone Encounter (Signed)
Returning your call. Closed previous message by mistake.

## 2013-04-26 NOTE — Telephone Encounter (Signed)
Returning your call. °

## 2013-04-26 NOTE — Telephone Encounter (Signed)
Patient returning your call.

## 2013-04-27 NOTE — Telephone Encounter (Signed)
Reviewed results with patient and echo ordered

## 2013-04-30 ENCOUNTER — Encounter: Payer: Self-pay | Admitting: *Deleted

## 2013-04-30 ENCOUNTER — Telehealth: Payer: Self-pay | Admitting: *Deleted

## 2013-04-30 DIAGNOSIS — I6529 Occlusion and stenosis of unspecified carotid artery: Secondary | ICD-10-CM

## 2013-04-30 NOTE — Telephone Encounter (Signed)
Order placed for repeat carotid doppler in 1 year 

## 2013-04-30 NOTE — Telephone Encounter (Signed)
Message copied by Marella Bile on Sun Apr 30, 2013 10:16 PM ------      Message from: Runell Gess      Created: Thu Apr 27, 2013  5:29 PM       No change from prior study. Repeat in 12 months. ------

## 2013-05-24 ENCOUNTER — Ambulatory Visit (HOSPITAL_COMMUNITY)
Admission: RE | Admit: 2013-05-24 | Discharge: 2013-05-24 | Disposition: A | Discharge status: Home / Self Care | Payer: Medicare Other | Source: Ambulatory Visit | Attending: Cardiovascular Disease | Admitting: Cardiovascular Disease

## 2013-05-24 DIAGNOSIS — I255 Ischemic cardiomyopathy: Secondary | ICD-10-CM

## 2013-05-24 DIAGNOSIS — I2510 Atherosclerotic heart disease of native coronary artery without angina pectoris: Secondary | ICD-10-CM | POA: Diagnosis not present

## 2013-05-24 DIAGNOSIS — I2589 Other forms of chronic ischemic heart disease: Secondary | ICD-10-CM | POA: Diagnosis not present

## 2013-05-24 DIAGNOSIS — Z87891 Personal history of nicotine dependence: Secondary | ICD-10-CM | POA: Diagnosis not present

## 2013-05-24 DIAGNOSIS — E785 Hyperlipidemia, unspecified: Secondary | ICD-10-CM | POA: Insufficient documentation

## 2013-05-24 DIAGNOSIS — I10 Essential (primary) hypertension: Secondary | ICD-10-CM | POA: Diagnosis not present

## 2013-05-24 NOTE — Progress Notes (Signed)
  Echocardiogram 2D Echocardiogram has been performed.  Victor Estes 05/24/2013, 8:25 AM

## 2013-05-30 DIAGNOSIS — M67919 Unspecified disorder of synovium and tendon, unspecified shoulder: Secondary | ICD-10-CM | POA: Diagnosis not present

## 2013-06-15 ENCOUNTER — Telehealth: Payer: Self-pay | Admitting: *Deleted

## 2013-06-15 NOTE — Telephone Encounter (Signed)
Dr Allyson Sabal reviewed the chart and gave clearance for knee surgery.  Ok to hold Plavix and ASA 7 days prior to procedure. Form was faxed to [facility]

## 2013-07-06 ENCOUNTER — Telehealth: Payer: Self-pay | Admitting: Cardiovascular Disease

## 2013-07-06 NOTE — Telephone Encounter (Signed)
Wants to know how many days after surgery should he start back on his Plavix and aspirin?.If she is not there please leave message for her.

## 2013-07-06 NOTE — Telephone Encounter (Signed)
Message from Dr. Debroah Loop office forwarded to K. Donne Hazel, RN to discuss w/ Dr. Gwenlyn Found.

## 2013-07-07 NOTE — Telephone Encounter (Signed)
Please advise 

## 2013-07-07 NOTE — Telephone Encounter (Signed)
Can re start antiplatelet Rx 2 weeks after his surgery

## 2013-07-10 NOTE — Telephone Encounter (Signed)
Percell Miller and Noemi Chapel made aware of the ASA and Plavix restart of 2 weeks.

## 2013-07-11 DIAGNOSIS — M25569 Pain in unspecified knee: Secondary | ICD-10-CM | POA: Diagnosis not present

## 2013-07-13 ENCOUNTER — Encounter (HOSPITAL_COMMUNITY): Payer: Self-pay | Admitting: Pharmacy Technician

## 2013-07-17 NOTE — Pre-Procedure Instructions (Signed)
Victor Estes  07/17/2013   Your procedure is scheduled on:  07/26/13  Report to [facility]  2 * 3 at 10 AM.  Call this number if you have problems the morning of surgery: [phone]   Remember:   Do not eat food or drink liquids after midnight.   Take these medicines the morning of surgery with A SIP OF WATER: metoprolol,protonix,flomax   Do not wear jewelry, make-up or nail polish.  Do not wear lotions, powders, or perfumes. You may wear deodorant.  Do not shave 48 hours prior to surgery. Men may shave face and neck.  Do not bring valuables to the hospital.  [facility] is not responsible                  for any belongings or valuables.               Contacts, dentures or bridgework may not be worn into surgery.  Leave suitcase in the car. After surgery it may be brought to your room.  For patients admitted to the hospital, discharge time is determined by your                treatment team.               Patients discharged the day of surgery will not be allowed to drive  home.  Name and phone number of your driver: family  Special Instructions: Incentive Spirometry - Practice and bring it with you on the day of surgery.   Please read over the following fact sheets that you were given: Pain Booklet, Coughing and Deep Breathing, Blood Transfusion Information, MRSA Information and Surgical Site Infection Prevention

## 2013-07-18 ENCOUNTER — Encounter (HOSPITAL_COMMUNITY)
Admission: RE | Admit: 2013-07-18 | Discharge: 2013-07-18 | Disposition: A | Discharge status: Home / Self Care | Payer: Medicare Other | Source: Ambulatory Visit | Attending: Orthopedic Surgery | Admitting: Orthopedic Surgery

## 2013-07-18 ENCOUNTER — Encounter (HOSPITAL_COMMUNITY): Payer: Self-pay

## 2013-07-18 DIAGNOSIS — Z01818 Encounter for other preprocedural examination: Secondary | ICD-10-CM | POA: Insufficient documentation

## 2013-07-18 DIAGNOSIS — Z01812 Encounter for preprocedural laboratory examination: Secondary | ICD-10-CM | POA: Insufficient documentation

## 2013-07-18 LAB — SURGICAL PCR SCREEN
MRSA, PCR: NEGATIVE
Staphylococcus aureus: POSITIVE — AB

## 2013-07-18 LAB — APTT: aPTT: 26 seconds (ref 24–37)

## 2013-07-18 LAB — CBC
HEMATOCRIT: 41.7 % (ref 39.0–52.0)
HEMOGLOBIN: 14.6 g/dL (ref 13.0–17.0)
MCH: 31.9 pg (ref 26.0–34.0)
MCHC: 35 g/dL (ref 30.0–36.0)
MCV: 91 fL (ref 78.0–100.0)
Platelets: 258 10*3/uL (ref 150–400)
RBC: 4.58 MIL/uL (ref 4.22–5.81)
RDW: 13.1 % (ref 11.5–15.5)
WBC: 8.3 10*3/uL (ref 4.0–10.5)

## 2013-07-18 LAB — BASIC METABOLIC PANEL
BUN: 15 mg/dL (ref 6–23)
CHLORIDE: 102 meq/L (ref 96–112)
CO2: 25 mEq/L (ref 19–32)
Calcium: 9.2 mg/dL (ref 8.4–10.5)
Creatinine, Ser: 0.98 mg/dL (ref 0.50–1.35)
GFR, EST NON AFRICAN AMERICAN: 83 mL/min — AB (ref >90)
Glucose, Bld: 119 mg/dL — ABNORMAL HIGH (ref 70–99)
POTASSIUM: 4.7 meq/L (ref 3.7–5.3)
SODIUM: 139 meq/L (ref 137–147)

## 2013-07-18 LAB — PROTIME-INR
INR: 0.96 (ref 0.00–1.49)
PROTHROMBIN TIME: 12.6 s (ref 11.6–15.2)

## 2013-07-18 LAB — ABO/RH: ABO/RH(D): A POS

## 2013-07-18 NOTE — Progress Notes (Signed)
Dr Gwenlyn Found called for cardiac release.

## 2013-07-19 ENCOUNTER — Other Ambulatory Visit: Payer: Self-pay | Admitting: Physician Assistant

## 2013-07-19 NOTE — Progress Notes (Signed)
Anesthesia Chart Review:  Patient is a 68 year old male scheduled for left TKAk on 07/26/13 by Dr. Kathryne Hitch.  History includes former smoker, CAD/MI '93 s/p CABG '95, HLD, HTN, moderate right carotid artery stenosis, colon surgery, cancer, right L4-S1 hemilaminectomy. Cardiologist is Dr. Gwenlyn Found who gave clearance for surgery with moderate CV risk and permission to hold Plavix and ASA 7 days prior.  EKG on 04/05/13 showed SB with first degree AVB, inferior infarct.  Nuclear stress test on 04/18/13 showed: There is a fixed defect that is most consistent with a previous infarction.  LV Wall Motion: Inferior wall hypokinesis, moderately depressed LV systolic function (estimated EF 38%). Compared to the previous study, the perfusion abnormality is unchanged, but LVEF has deteriorated significantly. Overall Impression: Low risk stress nuclear study with interval worsening of ischemic cardiomyopathy.  Echo was done on 05/24/13 to re-evaluate EF:   - Adverse outcomes: Marked bradycardia was noted throughout the study. - Left ventricle: The cavity size was moderately dilated. Wall thickness was increased in a pattern of mild LVH. Moderate global hypokinesis with regional variation. Systolic function was mildly to moderately reduced. The estimated ejection fraction was in the range of 40% to 45%. The study is not technically sufficient to allow evaluation of LV diastolic function. - Mitral valve: Calcified annulus. Mildly thickened leaflets. Mild regurgitation. - Left atrium: The atrium wasmoderately dilated (77ml/m2). - Right atrium: Moderately dilated (23 cm2). - Tricuspid valve: Mild regurgitation. - Pulmonary arteries: PA peak pressure: 61mm Hg (S). - Inferior vena cava: The vessel was normal in size; the respirophasic diameter changes were in the normal range (= 50%); findings are consistent with normal central venous pressure.  Carotid duplex on 04/25/13 showed: 50-69% RICA stenosis, no  hemodynamically significant LICA stenosis.  CXR is > 23 year old, so plan to get on the day of surgery.  Preoperative labs noted.  Surgeon orders were pending at his PAT visit, so any additional labs per surgeon will need to be done on arrival.  Patient has been cleared by his cardiologist following recent stress and echo.  If no acute changes then I would anticipate that he could proceed as planned.  George Hugh [facility] Stay [facility]/Anesthesiology Phone [phone] 07/19/2013 1:23 PM

## 2013-07-19 NOTE — H&P (Signed)
TOTAL KNEE ADMISSION H&P  Patient is being admitted for left total knee arthroplasty.  Subjective:  Chief Complaint:left knee pain.  HPI: Victor Estes, 68 y.o. male, has a history of pain and functional disability in the left knee due to arthritis and has failed non-surgical conservative treatments for greater than 12 weeks to includeNSAID's and/or analgesics, corticosteriod injections, viscosupplementation injections and activity modification.  Onset of symptoms was gradual, starting 6 years ago with gradually worsening course since that time. The patient noted prior procedures on the knee to include  arthroscopy on the left knee(s).  Patient currently rates pain in the left knee(s) at 8 out of 10 with activity. Patient has night pain, worsening of pain with activity and weight bearing, pain that interferes with activities of daily living, pain with passive range of motion, crepitus and joint swelling.  Patient has evidence of periarticular osteophytes and joint space narrowing by imaging studies. There is no active infection.  Patient Active Problem List   Diagnosis Date Noted  . Coronary artery disease 04/05/2013  . Essential hypertension 04/05/2013  . Hyperlipidemia 04/05/2013  . Carotid artery disease 04/05/2013   Past Medical History  Diagnosis Date  . Myocardial infarct     coronary artery bypass grafting in 1995  . Cancer   . GERD (gastroesophageal reflux disease)   . Hyperlipemia   . Carotid artery disease   . Hypertension     dr berry    Past Surgical History  Procedure Laterality Date  . Cardiac surgery    . Colon surgery    . Elbow surgery    . Lumbar laminectomy/decompression microdiscectomy  09/09/2011    Procedure: LUMBAR LAMINECTOMY/DECOMPRESSION MICRODISCECTOMY 1 LEVEL;  Surgeon: Eustace Moore, MD;  Location: [facility];  Service: Neurosurgery;  Laterality: Right;  Right Lumbar Five-Sacral One Hemilaminectomy    . Back surgery    . Cholecystectomy    .  Coronary artery bypass graft      x4    dr Redmond Pulling     (Not in a hospital admission) Allergies  Allergen Reactions  . Clarithromycin Swelling    History  Substance Use Topics  . Smoking status: Former Smoker    Quit date: 04/06/1983  . Smokeless tobacco: Not on file  . Alcohol Use: No    Family History  Problem Relation Age of Onset  . Hypertension Other      Review of Systems  Constitutional: Negative.   HENT: Negative.   Eyes: Negative.   Respiratory: Negative.   Cardiovascular: Negative.   Gastrointestinal: Negative.   Genitourinary: Negative.   Musculoskeletal: Positive for joint pain.  Skin: Negative.   Neurological: Negative.   Endo/Heme/Allergies: Negative.   Psychiatric/Behavioral: Negative.     Objective:  Physical Exam  Constitutional: He is oriented to person, place, and time. He appears well-developed and well-nourished.  HENT:  Head: Normocephalic and atraumatic.  Eyes: EOM are normal. Pupils are equal, round, and reactive to light.  Neck: Normal range of motion. Neck supple.  Cardiovascular: Normal rate and regular rhythm.  Exam reveals no gallop and no friction rub.   No murmur heard. Respiratory: Effort normal and breath sounds normal. No respiratory distress. He has no wheezes. He has no rales.  GI: Soft. Bowel sounds are normal.  Musculoskeletal:  Exam of his left knee reveals varus thrust on the left.  A little pain with straight leg raise, but is not dramatic.  Negative log roll of both hips.  Left knee 5  degrees of varus.  Motion 3-100 degrees.  He is neurovascularly intact distally.  He has marked grating and crepitus on the left.     Neurological: He is alert and oriented to person, place, and time.  Skin: Skin is warm and dry.  Psychiatric: He has a normal mood and affect. His behavior is normal. Judgment and thought content normal.    Vital signs in last 24 hours: @VSRANGES @  Labs:   Estimated body mass index is 29.63 kg/(m^2) as  calculated from the following:   Height as of 07/18/13: 5\' 8"  (1.727 m).   Weight as of 07/18/13: 88.361 kg (194 lb 12.8 oz).   Imaging Review Plain radiographs demonstrate severe degenerative joint disease of the left knee(s). The overall alignment ismild varus. The bone quality appears to be fair for age and reported activity level.  Assessment/Plan:  End stage arthritis, left knee   The patient history, physical examination, clinical judgment of the provider and imaging studies are consistent with end stage degenerative joint disease of the left knee(s) and total knee arthroplasty is deemed medically necessary. The treatment options including medical management, injection therapy arthroscopy and arthroplasty were discussed at length. The risks and benefits of total knee arthroplasty were presented and reviewed. The risks due to aseptic loosening, infection, stiffness, patella tracking problems, thromboembolic complications and other imponderables were discussed. The patient acknowledged the explanation, agreed to proceed with the plan and consent was signed. Patient is being admitted for inpatient treatment for surgery, pain control, PT, OT, prophylactic antibiotics, VTE prophylaxis, progressive ambulation and ADL's and discharge planning. The patient is planning to be discharged home with home health services

## 2013-07-20 LAB — TYPE AND SCREEN
ABO/RH(D): A POS
ANTIBODY SCREEN: NEGATIVE

## 2013-07-25 NOTE — Progress Notes (Signed)
Pt. Notified of time change. To arrive at 0900,pt. Voices understanding.

## 2013-07-26 ENCOUNTER — Inpatient Hospital Stay (HOSPITAL_COMMUNITY): Payer: Medicare Other | Admitting: Anesthesiology

## 2013-07-26 ENCOUNTER — Encounter (HOSPITAL_COMMUNITY): Payer: Self-pay | Admitting: Surgery

## 2013-07-26 ENCOUNTER — Encounter (HOSPITAL_COMMUNITY): Payer: Medicare Other | Admitting: Vascular Surgery

## 2013-07-26 ENCOUNTER — Inpatient Hospital Stay (HOSPITAL_COMMUNITY): Payer: Medicare Other

## 2013-07-26 ENCOUNTER — Encounter (HOSPITAL_COMMUNITY)
Admission: RE | Disposition: A | Discharge status: Home Health | Payer: Self-pay | Source: Ambulatory Visit | Attending: Orthopedic Surgery

## 2013-07-26 ENCOUNTER — Inpatient Hospital Stay (HOSPITAL_COMMUNITY)
Admission: RE | Admit: 2013-07-26 | Discharge: 2013-07-28 | DRG: 470 | Disposition: A | Discharge status: Home Health | Payer: Medicare Other | Source: Ambulatory Visit | Attending: Orthopedic Surgery | Admitting: Orthopedic Surgery

## 2013-07-26 DIAGNOSIS — Z951 Presence of aortocoronary bypass graft: Secondary | ICD-10-CM | POA: Diagnosis not present

## 2013-07-26 DIAGNOSIS — I10 Essential (primary) hypertension: Secondary | ICD-10-CM | POA: Diagnosis present

## 2013-07-26 DIAGNOSIS — I252 Old myocardial infarction: Secondary | ICD-10-CM

## 2013-07-26 DIAGNOSIS — Z9089 Acquired absence of other organs: Secondary | ICD-10-CM

## 2013-07-26 DIAGNOSIS — I2510 Atherosclerotic heart disease of native coronary artery without angina pectoris: Secondary | ICD-10-CM | POA: Diagnosis present

## 2013-07-26 DIAGNOSIS — Z881 Allergy status to other antibiotic agents status: Secondary | ICD-10-CM | POA: Diagnosis not present

## 2013-07-26 DIAGNOSIS — Z87891 Personal history of nicotine dependence: Secondary | ICD-10-CM

## 2013-07-26 DIAGNOSIS — Z7982 Long term (current) use of aspirin: Secondary | ICD-10-CM | POA: Diagnosis not present

## 2013-07-26 DIAGNOSIS — M179 Osteoarthritis of knee, unspecified: Secondary | ICD-10-CM | POA: Diagnosis present

## 2013-07-26 DIAGNOSIS — M1710 Unilateral primary osteoarthritis, unspecified knee: Principal | ICD-10-CM | POA: Diagnosis present

## 2013-07-26 DIAGNOSIS — Z7902 Long term (current) use of antithrombotics/antiplatelets: Secondary | ICD-10-CM | POA: Diagnosis not present

## 2013-07-26 DIAGNOSIS — K219 Gastro-esophageal reflux disease without esophagitis: Secondary | ICD-10-CM | POA: Diagnosis present

## 2013-07-26 DIAGNOSIS — E785 Hyperlipidemia, unspecified: Secondary | ICD-10-CM | POA: Diagnosis not present

## 2013-07-26 DIAGNOSIS — Z471 Aftercare following joint replacement surgery: Secondary | ICD-10-CM | POA: Diagnosis not present

## 2013-07-26 DIAGNOSIS — Z23 Encounter for immunization: Secondary | ICD-10-CM

## 2013-07-26 DIAGNOSIS — Z79899 Other long term (current) drug therapy: Secondary | ICD-10-CM

## 2013-07-26 DIAGNOSIS — Z96659 Presence of unspecified artificial knee joint: Secondary | ICD-10-CM | POA: Diagnosis not present

## 2013-07-26 DIAGNOSIS — Z8249 Family history of ischemic heart disease and other diseases of the circulatory system: Secondary | ICD-10-CM

## 2013-07-26 DIAGNOSIS — Z01818 Encounter for other preprocedural examination: Secondary | ICD-10-CM | POA: Diagnosis not present

## 2013-07-26 DIAGNOSIS — IMO0002 Reserved for concepts with insufficient information to code with codable children: Secondary | ICD-10-CM | POA: Diagnosis not present

## 2013-07-26 DIAGNOSIS — M25569 Pain in unspecified knee: Secondary | ICD-10-CM | POA: Diagnosis not present

## 2013-07-26 HISTORY — PX: TOTAL KNEE ARTHROPLASTY: SHX125

## 2013-07-26 LAB — HEPATIC FUNCTION PANEL
ALK PHOS: 82 U/L (ref 39–117)
ALT: 34 U/L (ref 0–53)
AST: 29 U/L (ref 0–37)
Albumin: 3.9 g/dL (ref 3.5–5.2)
Total Bilirubin: 0.6 mg/dL (ref 0.3–1.2)
Total Protein: 7.1 g/dL (ref 6.0–8.3)

## 2013-07-26 LAB — URINALYSIS, ROUTINE W REFLEX MICROSCOPIC
BILIRUBIN URINE: NEGATIVE
Glucose, UA: NEGATIVE mg/dL
Hgb urine dipstick: NEGATIVE
Ketones, ur: NEGATIVE mg/dL
LEUKOCYTES UA: NEGATIVE
Nitrite: NEGATIVE
PH: 6 (ref 5.0–8.0)
Protein, ur: NEGATIVE mg/dL
SPECIFIC GRAVITY, URINE: 1.017 (ref 1.005–1.030)
Urobilinogen, UA: 0.2 mg/dL (ref 0.0–1.0)

## 2013-07-26 LAB — DIFFERENTIAL
BASOS PCT: 1 % (ref 0–1)
Basophils Absolute: 0.1 10*3/uL (ref 0.0–0.1)
EOS ABS: 0.2 10*3/uL (ref 0.0–0.7)
EOS PCT: 3 % (ref 0–5)
Lymphocytes Relative: 24 % (ref 12–46)
Lymphs Abs: 1.8 10*3/uL (ref 0.7–4.0)
Monocytes Absolute: 0.8 10*3/uL (ref 0.1–1.0)
Monocytes Relative: 10 % (ref 3–12)
Neutro Abs: 4.7 10*3/uL (ref 1.7–7.7)
Neutrophils Relative %: 63 % (ref 43–77)

## 2013-07-26 SURGERY — ARTHROPLASTY, KNEE, TOTAL
Anesthesia: General | Site: Knee | Laterality: Left

## 2013-07-26 MED ORDER — PROPOFOL 10 MG/ML IV BOLUS
INTRAVENOUS | Status: AC
  Filled 2013-07-26: qty 20

## 2013-07-26 MED ORDER — DOCUSATE SODIUM 100 MG PO CAPS
100.0000 mg | ORAL_CAPSULE | Freq: Two times a day (BID) | ORAL | Status: DC
  Administered 2013-07-26 – 2013-07-28 (×4): 100 mg via ORAL
  Filled 2013-07-26 (×5): qty 1

## 2013-07-26 MED ORDER — MUPIROCIN 2 % EX OINT
TOPICAL_OINTMENT | Freq: Two times a day (BID) | CUTANEOUS | Status: DC
  Filled 2013-07-26: qty 22

## 2013-07-26 MED ORDER — METHOCARBAMOL 100 MG/ML IJ SOLN
500.0000 mg | Freq: Four times a day (QID) | INTRAVENOUS | Status: DC | PRN
  Filled 2013-07-26: qty 5

## 2013-07-26 MED ORDER — ASPIRIN EC 325 MG PO TBEC
325.0000 mg | DELAYED_RELEASE_TABLET | Freq: Every day | ORAL | Status: DC
  Administered 2013-07-27 – 2013-07-28 (×2): 325 mg via ORAL
  Filled 2013-07-26 (×3): qty 1

## 2013-07-26 MED ORDER — DEXAMETHASONE SODIUM PHOSPHATE 10 MG/ML IJ SOLN
10.0000 mg | Freq: Three times a day (TID) | INTRAMUSCULAR | Status: AC
  Filled 2013-07-26 (×3): qty 1

## 2013-07-26 MED ORDER — BUPIVACAINE HCL (PF) 0.25 % IJ SOLN
INTRAMUSCULAR | Status: AC
  Filled 2013-07-26: qty 30

## 2013-07-26 MED ORDER — MENTHOL 3 MG MT LOZG
1.0000 | LOZENGE | OROMUCOSAL | Status: DC | PRN
  Filled 2013-07-26: qty 9

## 2013-07-26 MED ORDER — CELECOXIB 200 MG PO CAPS
200.0000 mg | ORAL_CAPSULE | Freq: Two times a day (BID) | ORAL | Status: DC
  Administered 2013-07-27 – 2013-07-28 (×3): 200 mg via ORAL
  Filled 2013-07-26 (×5): qty 1

## 2013-07-26 MED ORDER — CHLORHEXIDINE GLUCONATE 4 % EX LIQD: 60.0000 mL | Freq: Once | CUTANEOUS | Status: DC

## 2013-07-26 MED ORDER — OXYCODONE HCL 5 MG PO TABS
ORAL_TABLET | ORAL | Status: AC
  Filled 2013-07-26: qty 1

## 2013-07-26 MED ORDER — LACTATED RINGERS IV SOLN
INTRAVENOUS | Status: DC
  Administered 2013-07-26 (×2): via INTRAVENOUS

## 2013-07-26 MED ORDER — BUPIVACAINE HCL (PF) 0.25 % IJ SOLN
INTRAMUSCULAR | Status: DC | PRN
  Administered 2013-07-26: 5 mL

## 2013-07-26 MED ORDER — NEOSTIGMINE METHYLSULFATE 1 MG/ML IJ SOLN
INTRAMUSCULAR | Status: DC | PRN
  Administered 2013-07-26: 2 mg via INTRAVENOUS

## 2013-07-26 MED ORDER — OXYCODONE-ACETAMINOPHEN 5-325 MG PO TABS
1.0000 | ORAL_TABLET | ORAL | Status: DC | PRN
  Administered 2013-07-26 – 2013-07-28 (×8): 2 via ORAL
  Filled 2013-07-26 (×8): qty 2

## 2013-07-26 MED ORDER — ONDANSETRON HCL 4 MG/2ML IJ SOLN
INTRAMUSCULAR | Status: DC | PRN
  Administered 2013-07-26: 4 mg via INTRAVENOUS

## 2013-07-26 MED ORDER — ACETAMINOPHEN 325 MG PO TABS: 650.0000 mg | ORAL_TABLET | Freq: Four times a day (QID) | ORAL | Status: DC | PRN

## 2013-07-26 MED ORDER — ACETAMINOPHEN 10 MG/ML IV SOLN
1000.0000 mg | Freq: Four times a day (QID) | INTRAVENOUS | Status: DC
  Administered 2013-07-26: 1000 mg via INTRAVENOUS
  Filled 2013-07-26: qty 100

## 2013-07-26 MED ORDER — LIDOCAINE HCL (CARDIAC) 20 MG/ML IV SOLN
INTRAVENOUS | Status: AC
Start: 2013-07-26 — End: 2013-07-26
  Filled 2013-07-26: qty 5

## 2013-07-26 MED ORDER — METOCLOPRAMIDE HCL 5 MG/ML IJ SOLN: 5.0000 mg | Freq: Three times a day (TID) | INTRAMUSCULAR | Status: DC | PRN

## 2013-07-26 MED ORDER — CLOPIDOGREL BISULFATE 75 MG PO TABS: 75.0000 mg | ORAL_TABLET | Freq: Every day | ORAL | Status: DC

## 2013-07-26 MED ORDER — SODIUM CHLORIDE 0.9 % IR SOLN
Status: DC | PRN
Start: 2013-07-26 — End: 2013-07-26
  Administered 2013-07-26: 3000 mL

## 2013-07-26 MED ORDER — KETOROLAC TROMETHAMINE 30 MG/ML IJ SOLN
INTRAMUSCULAR | Status: DC | PRN
  Administered 2013-07-26: 30 mg via INTRAVENOUS

## 2013-07-26 MED ORDER — ROCURONIUM BROMIDE 100 MG/10ML IV SOLN
INTRAVENOUS | Status: DC | PRN
  Administered 2013-07-26: 40 mg via INTRAVENOUS

## 2013-07-26 MED ORDER — METHOCARBAMOL 500 MG PO TABS
ORAL_TABLET | ORAL | Status: AC
  Filled 2013-07-26: qty 1

## 2013-07-26 MED ORDER — POTASSIUM CHLORIDE IN NACL 20-0.9 MEQ/L-% IV SOLN
INTRAVENOUS | Status: DC
  Administered 2013-07-26: 19:00:00 via INTRAVENOUS
  Filled 2013-07-26 (×3): qty 1000

## 2013-07-26 MED ORDER — KETOROLAC TROMETHAMINE 30 MG/ML IJ SOLN
INTRAMUSCULAR | Status: AC
  Filled 2013-07-26: qty 1

## 2013-07-26 MED ORDER — HYDROMORPHONE HCL PF 1 MG/ML IJ SOLN
0.2500 mg | INTRAMUSCULAR | Status: DC | PRN
  Administered 2013-07-26 (×4): 0.5 mg via INTRAVENOUS

## 2013-07-26 MED ORDER — GLYCOPYRROLATE 0.2 MG/ML IJ SOLN
INTRAMUSCULAR | Status: DC | PRN
  Administered 2013-07-26: 0.2 mg via INTRAVENOUS

## 2013-07-26 MED ORDER — MIDAZOLAM HCL 5 MG/5ML IJ SOLN
INTRAMUSCULAR | Status: DC | PRN
  Administered 2013-07-26: 2 mg via INTRAVENOUS

## 2013-07-26 MED ORDER — LIDOCAINE HCL (CARDIAC) 20 MG/ML IV SOLN
INTRAVENOUS | Status: DC | PRN
  Administered 2013-07-26: 80 mg via INTRAVENOUS

## 2013-07-26 MED ORDER — MIDAZOLAM HCL 2 MG/2ML IJ SOLN
INTRAMUSCULAR | Status: AC
  Filled 2013-07-26: qty 2

## 2013-07-26 MED ORDER — PANTOPRAZOLE SODIUM 40 MG PO TBEC
40.0000 mg | DELAYED_RELEASE_TABLET | Freq: Two times a day (BID) | ORAL | Status: DC
Start: 2013-07-26 — End: 2013-07-28
  Administered 2013-07-26 – 2013-07-28 (×4): 40 mg via ORAL
  Filled 2013-07-26 (×4): qty 1

## 2013-07-26 MED ORDER — PNEUMOCOCCAL VAC POLYVALENT 25 MCG/0.5ML IJ INJ
0.5000 mL | INJECTION | INTRAMUSCULAR | Status: AC
  Administered 2013-07-28: 0.5 mL via INTRAMUSCULAR
  Filled 2013-07-26: qty 0.5

## 2013-07-26 MED ORDER — PHENOL 1.4 % MT LIQD: 1.0000 | OROMUCOSAL | Status: DC | PRN

## 2013-07-26 MED ORDER — ONDANSETRON HCL 4 MG/2ML IJ SOLN
INTRAMUSCULAR | Status: AC
  Filled 2013-07-26: qty 2

## 2013-07-26 MED ORDER — OXYCODONE HCL 5 MG/5ML PO SOLN: 5.0000 mg | Freq: Once | ORAL | Status: AC | PRN

## 2013-07-26 MED ORDER — METOPROLOL TARTRATE 25 MG PO TABS
25.0000 mg | ORAL_TABLET | Freq: Two times a day (BID) | ORAL | Status: DC
  Administered 2013-07-27 – 2013-07-28 (×3): 25 mg via ORAL
  Filled 2013-07-26 (×5): qty 1

## 2013-07-26 MED ORDER — FENTANYL CITRATE 0.05 MG/ML IJ SOLN
INTRAMUSCULAR | Status: AC
  Filled 2013-07-26: qty 5

## 2013-07-26 MED ORDER — ROCURONIUM BROMIDE 50 MG/5ML IV SOLN
INTRAVENOUS | Status: AC
  Filled 2013-07-26: qty 1

## 2013-07-26 MED ORDER — VITAMIN D 1000 UNITS PO TABS
2000.0000 [IU] | ORAL_TABLET | Freq: Every day | ORAL | Status: DC
  Administered 2013-07-26 – 2013-07-28 (×3): 2000 [IU] via ORAL
  Filled 2013-07-26 (×3): qty 2

## 2013-07-26 MED ORDER — TAMSULOSIN HCL 0.4 MG PO CAPS
0.4000 mg | ORAL_CAPSULE | Freq: Every day | ORAL | Status: DC
  Administered 2013-07-27 – 2013-07-28 (×2): 0.4 mg via ORAL
  Filled 2013-07-26 (×3): qty 1

## 2013-07-26 MED ORDER — ASPIRIN EC 325 MG PO TBEC: 325.0000 mg | DELAYED_RELEASE_TABLET | Freq: Every day | ORAL | Status: DC

## 2013-07-26 MED ORDER — ZOLPIDEM TARTRATE 5 MG PO TABS: 5.0000 mg | ORAL_TABLET | Freq: Every evening | ORAL | Status: DC | PRN

## 2013-07-26 MED ORDER — ONDANSETRON HCL 4 MG PO TABS: 4.0000 mg | ORAL_TABLET | Freq: Four times a day (QID) | ORAL | Status: DC | PRN

## 2013-07-26 MED ORDER — KETAMINE HCL 100 MG/ML IJ SOLN
INTRAMUSCULAR | Status: DC | PRN
  Administered 2013-07-26: 30 mg via INTRAVENOUS
  Administered 2013-07-26: 10 mg via INTRAVENOUS

## 2013-07-26 MED ORDER — BUPIVACAINE LIPOSOME 1.3 % IJ SUSP
20.0000 mL | Freq: Once | INTRAMUSCULAR | Status: DC
  Filled 2013-07-26: qty 20

## 2013-07-26 MED ORDER — ACETAMINOPHEN 650 MG RE SUPP: 650.0000 mg | Freq: Four times a day (QID) | RECTAL | Status: DC | PRN

## 2013-07-26 MED ORDER — EPHEDRINE SULFATE 50 MG/ML IJ SOLN
INTRAMUSCULAR | Status: DC | PRN
  Administered 2013-07-26 (×4): 10 mg via INTRAVENOUS

## 2013-07-26 MED ORDER — PROPOFOL 10 MG/ML IV BOLUS
INTRAVENOUS | Status: DC | PRN
  Administered 2013-07-26: 200 mg via INTRAVENOUS

## 2013-07-26 MED ORDER — OXYCODONE-ACETAMINOPHEN 5-325 MG PO TABS: 1.0000 | ORAL_TABLET | ORAL | Status: DC | PRN

## 2013-07-26 MED ORDER — CEFAZOLIN SODIUM-DEXTROSE 2-3 GM-% IV SOLR
2.0000 g | Freq: Four times a day (QID) | INTRAVENOUS | Status: AC
  Administered 2013-07-26 (×2): 2 g via INTRAVENOUS
  Filled 2013-07-26 (×2): qty 50

## 2013-07-26 MED ORDER — METHOCARBAMOL 500 MG PO TABS
500.0000 mg | ORAL_TABLET | Freq: Four times a day (QID) | ORAL | Status: DC | PRN
  Administered 2013-07-26 – 2013-07-28 (×6): 500 mg via ORAL
  Filled 2013-07-26 (×6): qty 1

## 2013-07-26 MED ORDER — KETAMINE HCL 100 MG/ML IJ SOLN
INTRAMUSCULAR | Status: AC
  Filled 2013-07-26: qty 1

## 2013-07-26 MED ORDER — EZETIMIBE 10 MG PO TABS
10.0000 mg | ORAL_TABLET | Freq: Every day | ORAL | Status: DC
  Administered 2013-07-27 – 2013-07-28 (×2): 10 mg via ORAL
  Filled 2013-07-26 (×2): qty 1

## 2013-07-26 MED ORDER — BUPIVACAINE LIPOSOME 1.3 % IJ SUSP
INTRAMUSCULAR | Status: DC | PRN
  Administered 2013-07-26: 13:00:00

## 2013-07-26 MED ORDER — OXYCODONE HCL 5 MG PO TABS
5.0000 mg | ORAL_TABLET | Freq: Once | ORAL | Status: AC | PRN
  Administered 2013-07-26: 5 mg via ORAL

## 2013-07-26 MED ORDER — ONDANSETRON HCL 4 MG/2ML IJ SOLN: 4.0000 mg | Freq: Four times a day (QID) | INTRAMUSCULAR | Status: DC | PRN

## 2013-07-26 MED ORDER — DIPHENHYDRAMINE HCL 12.5 MG/5ML PO ELIX: 12.5000 mg | ORAL_SOLUTION | ORAL | Status: DC | PRN

## 2013-07-26 MED ORDER — HYDROMORPHONE HCL PF 1 MG/ML IJ SOLN
INTRAMUSCULAR | Status: AC
  Filled 2013-07-26: qty 2

## 2013-07-26 MED ORDER — CEFAZOLIN SODIUM-DEXTROSE 2-3 GM-% IV SOLR
2.0000 g | INTRAVENOUS | Status: AC
  Administered 2013-07-26: 2 g via INTRAVENOUS
  Filled 2013-07-26: qty 50

## 2013-07-26 MED ORDER — BISACODYL 5 MG PO TBEC: 5.0000 mg | DELAYED_RELEASE_TABLET | Freq: Every day | ORAL | Status: DC | PRN

## 2013-07-26 MED ORDER — HYDROMORPHONE HCL PF 1 MG/ML IJ SOLN
0.5000 mg | INTRAMUSCULAR | Status: DC | PRN
  Administered 2013-07-27 (×2): 0.5 mg via INTRAVENOUS
  Filled 2013-07-26 (×2): qty 1

## 2013-07-26 MED ORDER — DEXAMETHASONE 6 MG PO TABS
10.0000 mg | ORAL_TABLET | Freq: Three times a day (TID) | ORAL | Status: AC
  Administered 2013-07-26 – 2013-07-27 (×3): 10 mg via ORAL
  Filled 2013-07-26 (×3): qty 1

## 2013-07-26 MED ORDER — FENTANYL CITRATE 0.05 MG/ML IJ SOLN
INTRAMUSCULAR | Status: DC | PRN
  Administered 2013-07-26: 50 ug via INTRAVENOUS
  Administered 2013-07-26: 100 ug via INTRAVENOUS
  Administered 2013-07-26 (×2): 50 ug via INTRAVENOUS

## 2013-07-26 MED ORDER — METOCLOPRAMIDE HCL 10 MG PO TABS: 5.0000 mg | ORAL_TABLET | Freq: Three times a day (TID) | ORAL | Status: DC | PRN

## 2013-07-26 MED ORDER — METHOCARBAMOL 500 MG PO TABS: 500.0000 mg | ORAL_TABLET | Freq: Four times a day (QID) | ORAL | Status: DC | PRN

## 2013-07-26 MED ORDER — LISINOPRIL 20 MG PO TABS
20.0000 mg | ORAL_TABLET | Freq: Every morning | ORAL | Status: DC
  Administered 2013-07-28: 20 mg via ORAL
  Filled 2013-07-26 (×2): qty 1

## 2013-07-26 MED ORDER — ONDANSETRON HCL 4 MG/2ML IJ SOLN: 4.0000 mg | Freq: Once | INTRAMUSCULAR | Status: DC | PRN

## 2013-07-26 MED ORDER — ATORVASTATIN CALCIUM 40 MG PO TABS
40.0000 mg | ORAL_TABLET | Freq: Every day | ORAL | Status: DC
Start: 2013-07-26 — End: 2013-07-28
  Administered 2013-07-26 – 2013-07-27 (×2): 40 mg via ORAL
  Filled 2013-07-26 (×3): qty 1

## 2013-07-26 SURGICAL SUPPLY — 55 items
BANDAGE ESMARK 6X9 LF (GAUZE/BANDAGES/DRESSINGS) ×1 IMPLANT
BENZOIN TINCTURE PRP APPL 2/3 (GAUZE/BANDAGES/DRESSINGS) ×3 IMPLANT
BLADE SAG 18X100X1.27 (BLADE) ×6 IMPLANT
BNDG ESMARK 6X9 LF (GAUZE/BANDAGES/DRESSINGS) ×3
BOWL SMART MIX CTS (DISPOSABLE) ×3 IMPLANT
CEMENT BONE SIMPLEX SPEEDSET (Cement) ×6 IMPLANT
CLOSURE WOUND 1/2 X4 (GAUZE/BANDAGES/DRESSINGS) ×2
COVER SURGICAL LIGHT HANDLE (MISCELLANEOUS) ×3 IMPLANT
CUFF TOURNIQUET SINGLE 34IN LL (TOURNIQUET CUFF) ×3 IMPLANT
DRAPE EXTREMITY T 121X128X90 (DRAPE) ×3 IMPLANT
DRAPE PROXIMA HALF (DRAPES) ×3 IMPLANT
DRAPE U-SHAPE 47X51 STRL (DRAPES) ×3 IMPLANT
DRSG PAD ABDOMINAL 8X10 ST (GAUZE/BANDAGES/DRESSINGS) ×3 IMPLANT
DURAPREP 26ML APPLICATOR (WOUND CARE) ×3 IMPLANT
ELECT CAUTERY BLADE 6.4 (BLADE) ×3 IMPLANT
ELECT REM PT RETURN 9FT ADLT (ELECTROSURGICAL) ×3
ELECTRODE REM PT RTRN 9FT ADLT (ELECTROSURGICAL) ×1 IMPLANT
EVACUATOR 1/8 PVC DRAIN (DRAIN) ×3 IMPLANT
FACESHIELD LNG OPTICON STERILE (SAFETY) ×6 IMPLANT
GLOVE BIO SURGEON STRL SZ 6.5 (GLOVE) ×4 IMPLANT
GLOVE BIO SURGEONS STRL SZ 6.5 (GLOVE) ×2
GLOVE BIOGEL PI IND STRL 7.0 (GLOVE) ×2 IMPLANT
GLOVE BIOGEL PI INDICATOR 7.0 (GLOVE) ×4
GLOVE ORTHO TXT STRL SZ7.5 (GLOVE) ×3 IMPLANT
GOWN STRL REUS W/TWL 2XL LVL3 (GOWN DISPOSABLE) ×3 IMPLANT
HANDPIECE INTERPULSE COAX TIP (DISPOSABLE) ×2
IMMOBILIZER KNEE 22 UNIV (SOFTGOODS) ×3 IMPLANT
IMMOBILIZER KNEE 24 THIGH 36 (MISCELLANEOUS) IMPLANT
IMMOBILIZER KNEE 24 UNIV (MISCELLANEOUS)
KIT BASIN OR (CUSTOM PROCEDURE TRAY) ×3 IMPLANT
KIT ROOM TURNOVER OR (KITS) ×3 IMPLANT
KNEE/VIT E POLY LINER LEVEL 1B ×3 IMPLANT
MANIFOLD NEPTUNE II (INSTRUMENTS) ×3 IMPLANT
NEEDLE HYPO 25GX1X1/2 BEV (NEEDLE) ×3 IMPLANT
NS IRRIG 1000ML POUR BTL (IV SOLUTION) ×3 IMPLANT
PACK TOTAL JOINT (CUSTOM PROCEDURE TRAY) ×3 IMPLANT
PAD ARMBOARD 7.5X6 YLW CONV (MISCELLANEOUS) ×6 IMPLANT
PAD CAST 4YDX4 CTTN HI CHSV (CAST SUPPLIES) ×1 IMPLANT
PADDING CAST COTTON 4X4 STRL (CAST SUPPLIES) ×2
PADDING CAST COTTON 6X4 STRL (CAST SUPPLIES) ×3 IMPLANT
SET HNDPC FAN SPRY TIP SCT (DISPOSABLE) ×1 IMPLANT
SPONGE GAUZE 4X4 12PLY (GAUZE/BANDAGES/DRESSINGS) ×3 IMPLANT
STRIP CLOSURE SKIN 1/2X4 (GAUZE/BANDAGES/DRESSINGS) ×4 IMPLANT
SUCTION FRAZIER TIP 10 FR DISP (SUCTIONS) ×3 IMPLANT
SUT MNCRL AB 4-0 PS2 18 (SUTURE) ×3 IMPLANT
SUT MON AB 2-0 CT1 36 (SUTURE) ×3 IMPLANT
SUT VIC AB 0 CT1 27 (SUTURE) ×2
SUT VIC AB 0 CT1 27XBRD ANBCTR (SUTURE) ×1 IMPLANT
SUT VIC AB 2-0 CT1 27 (SUTURE) ×2
SUT VIC AB 2-0 CT1 TAPERPNT 27 (SUTURE) ×1 IMPLANT
SYR 50ML LL SCALE MARK (SYRINGE) ×3 IMPLANT
SYR CONTROL 10ML LL (SYRINGE) ×3 IMPLANT
TOWEL OR 17X24 6PK STRL BLUE (TOWEL DISPOSABLE) ×3 IMPLANT
TOWEL OR 17X26 10 PK STRL BLUE (TOWEL DISPOSABLE) ×3 IMPLANT
WATER STERILE IRR 1000ML POUR (IV SOLUTION) ×6 IMPLANT

## 2013-07-26 NOTE — Progress Notes (Signed)
Care of pt assumed by MA Caitlynne Harbeck RN 

## 2013-07-26 NOTE — Transfer of Care (Signed)
Immediate Anesthesia Transfer of Care Note  Patient: Victor Estes  Procedure(s) Performed: Procedure(s): TOTAL KNEE ARTHROPLASTY (Left)  Patient Location: PACU  Anesthesia Type:General  Level of Consciousness: awake, alert  and oriented  Airway & Oxygen Therapy: Patient Spontanous Breathing and Patient connected to face mask oxygen  Post-op Assessment: Report given to PACU RN, Post -op Vital signs reviewed and stable and Patient moving all extremities X 4  Post vital signs: Reviewed and stable  Complications: No apparent anesthesia complications

## 2013-07-26 NOTE — Interval H&P Note (Signed)
History and Physical Interval Note:  07/26/2013 8:19 AM  [name]  has presented today for surgery, with the diagnosis of DJD LEFT KNEE  The various methods of treatment have been discussed with the patient and family. After consideration of risks, benefits and other options for treatment, the patient has consented to  Procedure(s): TOTAL KNEE ARTHROPLASTY (Left) as a surgical intervention .  The patient's history has been reviewed, patient examined, no change in status, stable for surgery.  I have reviewed the patient's chart and labs.  Questions were answered to the patient's satisfaction.     Tamalyn Wadsworth F

## 2013-07-26 NOTE — Progress Notes (Signed)
Orthopedic Tech Progress Note Patient Details:  Victor Estes July 12, 1945 [id number]  CPM Left Knee CPM Left Knee: On Left Knee Flexion (Degrees): 60 Left Knee Extension (Degrees): 0 Additional Comments: trapeze bar patient helper [name] roll  [name] 07/26/2013, 3:14 PM

## 2013-07-26 NOTE — Anesthesia Postprocedure Evaluation (Signed)
  Anesthesia Post-op Note  Patient: Victor Estes  Procedure(s) Performed: Procedure(s): TOTAL KNEE ARTHROPLASTY (Left)  Patient Location: PACU  Anesthesia Type:General  Level of Consciousness: awake, alert  and oriented  Airway and Oxygen Therapy: Patient Spontanous Breathing  Post-op Pain: mild  Post-op Assessment: Post-op Vital signs reviewed, Patient's Cardiovascular Status Stable, Respiratory Function Stable, Patent Airway, No signs of Nausea or vomiting and Pain level controlled  Post-op Vital Signs: Reviewed and stable  Complications: No apparent anesthesia complications

## 2013-07-26 NOTE — Preoperative (Signed)
Beta Blockers   Reason not to administer Beta Blockers:Not Applicable 

## 2013-07-26 NOTE — Progress Notes (Signed)
UR completed 

## 2013-07-26 NOTE — Discharge Summary (Addendum)
Patient ID: Victor Estes MRN: [id number] DOB/AGE: 08-18-1945 68 y.o.  Admit date: 07/26/2013 Discharge date: 07/28/2013  Admission Diagnoses:  Active Problems:   DJD (degenerative joint disease) of knee   Discharge Diagnoses:  Same  Past Medical History  Diagnosis Date  . Myocardial infarct     coronary artery bypass grafting in 1995  . Cancer   . GERD (gastroesophageal reflux disease)   . Hyperlipemia   . Carotid artery disease   . Hypertension     dr berry    Surgeries: Procedure(s): TOTAL KNEE ARTHROPLASTY on 07/26/2013   Consultants:    Discharged Condition: Improved  Hospital Course: Victor Estes is an 68 y.o. male who was admitted 07/26/2013 for operative treatment of<principal problem not specified>. Patient has severe unremitting pain that affects sleep, daily activities, and work/hobbies. After pre-op clearance the patient was taken to the operating room on 07/26/2013 and underwent  Procedure(s): TOTAL KNEE ARTHROPLASTY.    Patient was given perioperative antibiotics:     Anti-infectives   Start     Dose/Rate Route Frequency Ordered Stop   07/26/13 1800  ceFAZolin (ANCEF) IVPB 2 g/50 mL premix     2 g 100 mL/hr over 30 Minutes Intravenous Every 6 hours 07/26/13 1747 07/26/13 2352   07/26/13 1003  ceFAZolin (ANCEF) IVPB 2 g/50 mL premix     2 g 100 mL/hr over 30 Minutes Intravenous On call to O.R. 07/26/13 1003 07/26/13 1205       Patient was given sequential compression devices, early ambulation, and chemoprophylaxis to prevent DVT.  Patient benefited maximally from hospital stay and there were no complications.    Recent vital signs:  Patient Vitals for the past 24 hrs:  BP Temp Temp src Pulse Resp SpO2  07/28/13 0542 104/61 mmHg 97.9 F (36.6 C) - 66 16 92 %  07/28/13 0400 - - - - 16 -  07/28/13 0000 - - - - 16 -  07/27/13 2108 123/76 mmHg 97.7 F (36.5 C) - 81 16 95 %  07/27/13 2000 - - - - 16 -  07/27/13 1526 116/62 mmHg 97.8 F  (36.6 C) Oral 72 16 94 %  07/27/13 1200 - - - - 16 96 %  07/27/13 1004 113/77 mmHg - - 86 - -     Recent laboratory studies:   Recent Labs  07/27/13 0425 07/28/13 0533  WBC 12.7* 23.1*  HGB 12.0* 10.6*  HCT 35.3* 31.0*  PLT 229 237  NA 138 138  K 4.4 4.8  CL 100 101  CO2 24 23  BUN 14 21  CREATININE 0.95 1.00  GLUCOSE 199* 191*  CALCIUM 8.4 8.9     Discharge Medications:     Medication List    STOP taking these medications       acetaminophen 325 MG tablet  Commonly known as:  TYLENOL     aspirin 81 MG tablet  Replaced by:  aspirin EC 325 MG tablet     traMADol 50 MG tablet  Commonly known as:  ULTRAM      TAKE these medications       aspirin EC 325 MG tablet  Take 1 tablet (325 mg total) by mouth daily.     aspirin 325 MG EC tablet  Take 1 tablet (325 mg total) by mouth daily with breakfast.     Cholecalciferol 2000 UNITS Tabs  Take 2,000 Units by mouth daily.     clopidogrel 75 MG tablet  Commonly known as:  PLAVIX  Take 1 tablet (75 mg total) by mouth daily.     ezetimibe 10 MG tablet  Commonly known as:  ZETIA  Take 1 tablet (10 mg total) by mouth daily.     lisinopril 40 MG tablet  Commonly known as:  PRINIVIL,ZESTRIL  Take 20 mg by mouth every morning.     methocarbamol 500 MG tablet  Commonly known as:  ROBAXIN  Take 1 tablet (500 mg total) by mouth every 6 (six) hours as needed for muscle spasms.     metoprolol tartrate 25 MG tablet  Commonly known as:  LOPRESSOR  Take 25 mg by mouth 2 (two) times daily.     oxyCODONE-acetaminophen 5-325 MG per tablet  Commonly known as:  ROXICET  Take 1-2 tablets by mouth every 4 (four) hours as needed for severe pain.     oxyCODONE-acetaminophen 5-325 MG per tablet  Commonly known as:  PERCOCET/ROXICET  Take 1-2 tablets by mouth every 4 (four) hours as needed for severe pain.     pantoprazole 40 MG tablet  Commonly known as:  PROTONIX  Take 40 mg by mouth 2 (two) times daily.      simvastatin 80 MG tablet  Commonly known as:  ZOCOR  Take 80 mg by mouth at bedtime.     tamsulosin 0.4 MG Caps capsule  Commonly known as:  FLOMAX  Take 0.4 mg by mouth daily after breakfast.        Diagnostic Studies: Dg Chest 2 View  07/26/2013   CLINICAL DATA:  Preop evaluation  EXAM: CHEST  2 VIEW  COMPARISON:  DG CHEST 2 VIEW dated 09/09/2011  FINDINGS: The cardiac silhouette is mildly enlarged. Patient is status post median sternotomy and coronary artery bypass grafting. The lungs clear. The osseous structures unremarkable. There is a component of mild flattening of the hemidiaphragms.  IMPRESSION: Mild COPD changes without evidence of acute cardiopulmonary disease.   Electronically Signed   By: Margaree Mackintosh M.D.   On: 07/26/2013 11:50    Disposition: 01-Home or Self Care  Discharge Orders   Future Orders Complete By Expires   Call MD / Call 911  As directed    Comments:     If you experience chest pain or shortness of breath, CALL 911 and be transported to the hospital emergency room.  If you develope a fever above 101 F, pus (white drainage) or increased drainage or redness at the wound, or calf pain, call your surgeon's office.   Change dressing  As directed    Comments:     Change dressing on Sunday, then change the dressing daily with sterile 4 x 4 inch gauze dressing and apply TED hose.  You may clean the incision with alcohol prior to redressing.   Change dressing  As directed    Comments:     Change the dressing daily with sterile 4 x 4 inch gauze dressing and paper tape.  You may clean the incision with alcohol prior to redressing   Constipation Prevention  As directed    Comments:     Drink plenty of fluids.  Prune juice may be helpful.  You may use a stool softener, such as Colace (over the counter) 100 mg twice a day.  Use MiraLax (over the counter) for constipation as needed.   CPM  As directed    Comments:     Continuous passive motion machine (CPM):      Use  the CPM from 0- to 60  for 6 hours per day.      You may increase by 10 per day.  You may break it up into 2 or 3 sessions per day.      Use CPM for 3-4 weeks or until you are told to stop.   Diet - low sodium heart healthy  As directed    Discharge instructions  As directed    Comments:     Take Aspirin 325 mg once daily for the next 30 days for prevention of blood clots.  Resume Plavix 2 weeks after surgery.  Weight bearing as tolerated.  May change dressing on Sunday.  May shower on Monday, but do not soak incision.  May ice for up to 20 minutes at a time for pain and swelling.  Follow up appointment in our office in two weeks.   Do not put a pillow under the knee. Place it under the heel.  As directed    Comments:     Place gray foam under operative heel when in bed or in a chair to work on extension   Follow the hip precautions as taught in Physical Therapy  As directed    Comments:     Posterior total hip precautions.  Weight bearing as tolerated   Increase activity slowly as tolerated  As directed    TED hose  As directed    Comments:     Use stockings (TED hose) for 2 weeks on both leg(s).  You may remove them at night for sleeping.   TED hose  As directed    Comments:     Use stockings (TED hose) for 4 weeks on both leg(s).  You may remove them at night for sleeping.      Follow-up Information   Follow up with [facility], MD. Schedule an appointment as soon as possible for a visit in 2 weeks.   Specialty:  Orthopedic Surgery   Contact information:   [address] [place] [address] [phone]        Signed: Larae Grooms 07/28/2013, 8:08 AM

## 2013-07-26 NOTE — Anesthesia Procedure Notes (Signed)
Procedure Name: Intubation Date/Time: 07/26/2013 12:02 PM Performed by: Rush Farmer E Pre-anesthesia Checklist: Patient identified, Emergency Drugs available, Suction available, Patient being monitored and Timeout performed Patient Re-evaluated:Patient Re-evaluated prior to inductionOxygen Delivery Method: Circle system utilized Preoxygenation: Pre-oxygenation with 100% oxygen Intubation Type: IV induction Ventilation: Mask ventilation without difficulty Laryngoscope Size: Mac and 3 Grade View: Grade I Tube type: Oral Tube size: 7.5 mm Number of attempts: 1 Airway Equipment and Method: Stylet Placement Confirmation: ETT inserted through vocal cords under direct vision,  positive ETCO2 and breath sounds checked- equal and bilateral Secured at: 22 cm Tube secured with: Tape Dental Injury: Teeth and Oropharynx as per pre-operative assessment

## 2013-07-26 NOTE — Anesthesia Preprocedure Evaluation (Addendum)
Anesthesia Evaluation  Patient identified by MRN, date of birth, ID band Patient awake    Reviewed: Allergy & Precautions, H&P , NPO status , Patient's Chart, lab work & pertinent test results, reviewed documented beta blocker date and time   Airway Mallampati: I      Dental  (+) Teeth Intact and Caps   Pulmonary former smoker,          Cardiovascular hypertension, Pt. on medications + CAD and + Peripheral Vascular Disease     Neuro/Psych    GI/Hepatic GERD-  Controlled and Medicated,  Endo/Other    Renal/GU      Musculoskeletal   Abdominal   Peds  Hematology   Anesthesia Other Findings   Reproductive/Obstetrics                         Anesthesia Physical Anesthesia Plan  ASA: III  Anesthesia Plan: General   Post-op Pain Management:    Induction: Intravenous  Airway Management Planned: Oral ETT  Additional Equipment:   Intra-op Plan:   Post-operative Plan: Extubation in OR  Informed Consent: I have reviewed the patients History and Physical, chart, labs and discussed the procedure including the risks, benefits and alternatives for the proposed anesthesia with the patient or authorized representative who has indicated his/her understanding and acceptance.   Dental advisory given  Plan Discussed with: CRNA and Anesthesiologist  Anesthesia Plan Comments:         Anesthesia Quick Evaluation

## 2013-07-26 NOTE — H&P (View-Only) (Signed)
TOTAL KNEE ADMISSION H&P  Patient is being admitted for left total knee arthroplasty.  Subjective:  Chief Complaint:left knee pain.  HPI: Victor Estes, 67 y.o. male, has a history of pain and functional disability in the left knee due to arthritis and has failed non-surgical conservative treatments for greater than 12 weeks to includeNSAID's and/or analgesics, corticosteriod injections, viscosupplementation injections and activity modification.  Onset of symptoms was gradual, starting 6 years ago with gradually worsening course since that time. The patient noted prior procedures on the knee to include  arthroscopy on the left knee(s).  Patient currently rates pain in the left knee(s) at 8 out of 10 with activity. Patient has night pain, worsening of pain with activity and weight bearing, pain that interferes with activities of daily living, pain with passive range of motion, crepitus and joint swelling.  Patient has evidence of periarticular osteophytes and joint space narrowing by imaging studies. There is no active infection.  Patient Active Problem List   Diagnosis Date Noted  . Coronary artery disease 04/05/2013  . Essential hypertension 04/05/2013  . Hyperlipidemia 04/05/2013  . Carotid artery disease 04/05/2013   Past Medical History  Diagnosis Date  . Myocardial infarct     coronary artery bypass grafting in 1995  . Cancer   . GERD (gastroesophageal reflux disease)   . Hyperlipemia   . Carotid artery disease   . Hypertension     dr berry    Past Surgical History  Procedure Laterality Date  . Cardiac surgery    . Colon surgery    . Elbow surgery    . Lumbar laminectomy/decompression microdiscectomy  09/09/2011    Procedure: LUMBAR LAMINECTOMY/DECOMPRESSION MICRODISCECTOMY 1 LEVEL;  Surgeon: David S Jones, MD;  Location: [facility];  Service: Neurosurgery;  Laterality: Right;  Right Lumbar Five-Sacral One Hemilaminectomy    . Back surgery    . Cholecystectomy    .  Coronary artery bypass graft      x4    dr wilson     (Not in a hospital admission) Allergies  Allergen Reactions  . Clarithromycin Swelling    History  Substance Use Topics  . Smoking status: Former Smoker    Quit date: 04/06/1983  . Smokeless tobacco: Not on file  . Alcohol Use: No    Family History  Problem Relation Age of Onset  . Hypertension Other      Review of Systems  Constitutional: Negative.   HENT: Negative.   Eyes: Negative.   Respiratory: Negative.   Cardiovascular: Negative.   Gastrointestinal: Negative.   Genitourinary: Negative.   Musculoskeletal: Positive for joint pain.  Skin: Negative.   Neurological: Negative.   Endo/Heme/Allergies: Negative.   Psychiatric/Behavioral: Negative.     Objective:  Physical Exam  Constitutional: He is oriented to person, place, and time. He appears well-developed and well-nourished.  HENT:  Head: Normocephalic and atraumatic.  Eyes: EOM are normal. Pupils are equal, round, and reactive to light.  Neck: Normal range of motion. Neck supple.  Cardiovascular: Normal rate and regular rhythm.  Exam reveals no gallop and no friction rub.   No murmur heard. Respiratory: Effort normal and breath sounds normal. No respiratory distress. He has no wheezes. He has no rales.  GI: Soft. Bowel sounds are normal.  Musculoskeletal:  Exam of his left knee reveals varus thrust on the left.  A little pain with straight leg raise, but is not dramatic.  Negative log roll of both hips.  Left knee 5   degrees of varus.  Motion 3-100 degrees.  He is neurovascularly intact distally.  He has marked grating and crepitus on the left.     Neurological: He is alert and oriented to person, place, and time.  Skin: Skin is warm and dry.  Psychiatric: He has a normal mood and affect. His behavior is normal. Judgment and thought content normal.    Vital signs in last 24 hours: @VSRANGES @  Labs:   Estimated body mass index is 29.63 kg/(m^2) as  calculated from the following:   Height as of 07/18/13: 5\' 8"  (1.727 m).   Weight as of 07/18/13: 88.361 kg (194 lb 12.8 oz).   Imaging Review Plain radiographs demonstrate severe degenerative joint disease of the left knee(s). The overall alignment ismild varus. The bone quality appears to be fair for age and reported activity level.  Assessment/Plan:  End stage arthritis, left knee   The patient history, physical examination, clinical judgment of the provider and imaging studies are consistent with end stage degenerative joint disease of the left knee(s) and total knee arthroplasty is deemed medically necessary. The treatment options including medical management, injection therapy arthroscopy and arthroplasty were discussed at length. The risks and benefits of total knee arthroplasty were presented and reviewed. The risks due to aseptic loosening, infection, stiffness, patella tracking problems, thromboembolic complications and other imponderables were discussed. The patient acknowledged the explanation, agreed to proceed with the plan and consent was signed. Patient is being admitted for inpatient treatment for surgery, pain control, PT, OT, prophylactic antibiotics, VTE prophylaxis, progressive ambulation and ADL's and discharge planning. The patient is planning to be discharged home with home health services

## 2013-07-27 ENCOUNTER — Encounter (HOSPITAL_COMMUNITY): Payer: Self-pay | Admitting: Orthopedic Surgery

## 2013-07-27 LAB — CBC
HEMATOCRIT: 35.3 % — AB (ref 39.0–52.0)
HEMOGLOBIN: 12 g/dL — AB (ref 13.0–17.0)
MCH: 31.3 pg (ref 26.0–34.0)
MCHC: 34 g/dL (ref 30.0–36.0)
MCV: 91.9 fL (ref 78.0–100.0)
Platelets: 229 10*3/uL (ref 150–400)
RBC: 3.84 MIL/uL — ABNORMAL LOW (ref 4.22–5.81)
RDW: 13.2 % (ref 11.5–15.5)
WBC: 12.7 10*3/uL — AB (ref 4.0–10.5)

## 2013-07-27 LAB — URINE CULTURE
COLONY COUNT: NO GROWTH
CULTURE: NO GROWTH

## 2013-07-27 LAB — BASIC METABOLIC PANEL
BUN: 14 mg/dL (ref 6–23)
CHLORIDE: 100 meq/L (ref 96–112)
CO2: 24 mEq/L (ref 19–32)
Calcium: 8.4 mg/dL (ref 8.4–10.5)
Creatinine, Ser: 0.95 mg/dL (ref 0.50–1.35)
GFR, EST NON AFRICAN AMERICAN: 84 mL/min — AB (ref >90)
GLUCOSE: 199 mg/dL — AB (ref 70–99)
Potassium: 4.4 mEq/L (ref 3.7–5.3)
SODIUM: 138 meq/L (ref 137–147)

## 2013-07-27 NOTE — Op Note (Signed)
NAME:  RUSH, SALCE NO.:  [id number]  MEDICAL RECORD NO.:  [id number]  LOCATION:  5N29C                        FACILITY:  [facility]  PHYSICIAN:  Ninetta Lights, M.D. DATE OF BIRTH:  August 17, 1945  DATE OF PROCEDURE:  07/26/2013 DATE OF DISCHARGE:                              OPERATIVE REPORT   PREOPERATIVE DIAGNOSES:  Left knee end-stage degenerative arthritis. Varus alignment.  Bone loss proximal tibia especially posteriorly.  Mild flexion contracture.  POSTOPERATIVE DIAGNOSES:  Left knee end-stage degenerative arthritis. Varus alignment.  Bone loss proximal tibia especially posteriorly.  Mild flexion contracture.  PROCEDURE:  Left knee modified minimally invasive total knee replacement, Stryker triathlon prosthesis.  Soft tissue balancing. Cemented pegged posterior stabilized #5 femoral component.  Cemented #6 tibial component, 9 mm polyethylene insert.  Cemented resurfacing 35-mm patellar component.  SURGEON:  Ninetta Lights, MD  ASSISTANT:  Doran Stabler, PA, present throughout the entire case and necessary for timely completion of procedure.  ANESTHESIA:  General.  BLOOD LOSS:  Minimal.  SPECIMENS:  None.  CULTURES:  None.  COMPLICATIONS:  None.  DRESSINGS:  Soft compressive knee immobilizer.  TOURNIQUET TIME:  1 hour.  DRAINS:  Hemovac x1.  DESCRIPTION OF PROCEDURE:  The patient was brought to the operating room, placed on the operating table in supine position.  After adequate anesthesia had been obtained, knee examined.  More than 5 degrees of varus not very correctable.  Mild flexion contracture.  Tourniquet applied, prepped and draped in usual sterile fashion.  Exsanguinated with elevation of Esmarch, tourniquet inflated to 350 mmHg.  Straight incision above the patella down the tibial tubercle.  Skin and subcutaneous tissue was divided.  Medial arthrotomy, vastus splitting, preserving quad tendon.  Medial capsule release.  Knee  exposed. Remnants of menisci, cruciate ligaments, loose body spurs removed. Distal femur exposed.  Intramedullary guide, flexible rod.  An 8 mm resection, 5 degrees of valgus.  Using epicondylar axis, the femur was sized, cut, and fitted for a pegged #5 component.  Proximal tibial resection below the defect medially.  A 3-degree posterior slope cut. Size #6 component.  Patella exposed, posterior 10 mm removed.  Drilled, sized, and fitted for a 35 mm component.  Trials put in place.  With the 9 mm insert and the medial capsule release, I was very pleased with full extension, full flexion, nicely balanced good biomechanical axis, good stability, good patellofemoral tracking.  Tibia was marked for rotation and reamed.  All trials removed.  Copious irrigation with pulse irrigating device.  Cement prepared, and placed on all components, firmly seated.  Patella held with a clamp.  Polyethylene attached to tibia.  Once the cement hardened, the knee was irrigated once again. Soft tissue was injected with Exparel.  Hemovac was placed and brought through a separate stab wound.  Arthrotomy closed with #1 Vicryl, skin and subcutaneous tissue closed with absorbable suture.  Margins were injected with Marcaine.  Sterile compressive dressing applied. Tourniquet deflated and removed.  Knee immobilizer applied.  Anesthesia reversed, brought to the recovery room, tolerated the surgery well, no complications.     Ninetta Lights, M.D.     DFM/MEDQ  D:  07/26/2013  T:  07/27/2013  Job:  [id number]

## 2013-07-27 NOTE — Progress Notes (Signed)
Subjective: 1 Day Post-Op Procedure(s) (LRB): TOTAL KNEE ARTHROPLASTY (Left) Patient reports pain as 3 on 0-10 scale.  Patient reports that pain has diminished somewhat since last night.  No nausea/vomiting, flatus or BM.  Good appetite.    Objective: Vital signs in last 24 hours: Temp:  [97.2 F (36.2 C)-98.3 F (36.8 C)] 98.3 F (36.8 C) (01/29 0536) Pulse Rate:  [50-97] 93 (01/29 0536) Resp:  [12-20] 18 (01/29 0536) BP: (92-149)/(34-68) 115/64 mmHg (01/29 0536) SpO2:  [92 %-97 %] 97 % (01/29 0536)  Intake/Output from previous day: 01/28 0701 - 01/29 0700 In: 1900 [P.O.:400; I.V.:1450; IV Piggyback:50] Out: 1500 [Urine:700; Drains:750; Blood:50] Intake/Output this shift: Total I/O In: 240 [P.O.:240] Out: 700 [Urine:500; Drains:200]   Recent Labs  07/27/13 0425  HGB 12.0*    Recent Labs  07/27/13 0425  WBC 12.7*  RBC 3.84*  HCT 35.3*  PLT 229   No results found for this basename: NA, K, CL, CO2, BUN, CREATININE, GLUCOSE, CALCIUM,  in the last 72 hours No results found for this basename: LABPT, INR,  in the last 72 hours  Neurologically intact ABD soft Neurovascular intact Sensation intact distally Intact pulses distally Dorsiflexion/Plantar flexion intact Compartment soft hemovac drain pulled by me today No drainage noted through ace bandage  Assessment/Plan: 1 Day Post-Op Procedure(s) (LRB): TOTAL KNEE ARTHROPLASTY (Left) Advance diet Up with therapy D/C IV fluids Plan for discharge tomorrow Discharge home with home health  ANTON, M. LINDSEY 07/27/2013, 6:18 AM

## 2013-07-27 NOTE — Evaluation (Signed)
Physical Therapy Evaluation Patient Details Name: Victor Estes MRN: [id number] DOB: [birth year] Today's Date: 07/27/2013 Time: 6967-8938 PT Time Calculation (min): 39 min  PT Assessment / Plan / Recommendation History of Present Illness  Pt. underwent L TKA for endstage OA  Clinical Impression  This patient underwent a left TKA and presents to PT with anticipated post-op decrease in strength and ROM, decreased functional mobility and gait.  Pt. Will benefit from acute PT to address these and below issues.  He did well with PT first time up and appears anxious to follow through with his rehab to reach maximum functional level.  Expect he will comply with doing his exercised.  Should meet goals for [place] home tomorrow.    PT Assessment  Patient needs continued PT services    Follow Up Recommendations  Home health PT;Supervision/Assistance - 24 hour    Does the patient have the potential to tolerate intense rehabilitation      Barriers to Discharge        Equipment Recommendations  None recommended by PT (pt already has RW in the home, does not want 3n1)    Recommendations for Other Services     Frequency 7X/week    Precautions / Restrictions Precautions Precautions: Knee Precaution Booklet Issued: Yes (comment) Precaution Comments: pt. was provided exercise handout and educated on precautions Required Braces or Orthoses: Knee Immobilizer - Left Knee Immobilizer - Left: Other (comment) (until DC'd) Restrictions Weight Bearing Restrictions: Yes LLE Weight Bearing: Weight bearing as tolerated   Pertinent Vitals/Pain See vitals tab       Mobility  Bed Mobility Overal bed mobility: Modified Independent General bed mobility comments: use of bedrail and hob up about 30 degrees, pt at mod I level Transfers Overall transfer level: Needs assistance Equipment used: Rolling walker (2 wheeled) Transfers: Sit to/from Stand Sit to Stand: Min assist General transfer comment:  cues for technique and sequencing, no LOB noted Ambulation/Gait Ambulation/Gait assistance: Min assist Ambulation Distance (Feet): 60 Feet Assistive device: Rolling walker (2 wheeled) Gait Pattern/deviations: Step-to pattern General Gait Details: pt. needed cues to WBAT and rely less on UEs, initial sequencing cues; min assist for safety and stability    Exercises Total Joint Exercises Ankle Circles/Pumps: AROM;Both;10 reps Quad Sets: AROM;Both;10 reps Short Arc Quad: AROM;Left;10 reps Heel Slides: AROM;10 reps;Both Straight Leg Raises: AROM;AAROM;Left;5 reps Knee Flexion: AROM;Left;5 reps;Seated Goniometric ROM: 0 to 85   PT Diagnosis: Difficulty walking;Acute pain;Generalized weakness  PT Problem List: Decreased strength;Decreased range of motion;Decreased activity tolerance;Decreased mobility;Decreased knowledge of use of DME;Decreased knowledge of precautions;Pain PT Treatment Interventions: DME instruction;Gait training;Stair training;Functional mobility training;Therapeutic activities;Therapeutic exercise;Patient/family education     PT Goals(Current goals can be found in the care plan section) Acute Rehab PT Goals Patient Stated Goal: resume normal level of activity (acitve) PT Goal Formulation: With patient Time For Goal Achievement: 08/03/13 Potential to Achieve Goals: Good  Visit Information  Last PT Received On: 07/27/13 Assistance Needed: +1 History of Present Illness: Pt. underwent L TKA for endstage OA       Prior [place] expects to be discharged to:: Private residence Living Arrangements: Spouse/significant other Available Help at Discharge: Family;Available 24 hours/day Type of Home: House Home Access: Stairs to enter [employer] of Steps: 4 Entrance Stairs-Rails: None Home Layout: Two level;Laundry or work area in basement;Able to live on main level with bedroom/bathroom Home Equipment: Environmental consultant - 2 wheels;Cane -  single point Prior Function Level of Independence: Independent (played 27 holes  of golf e days PTA) Communication Communication: No difficulties    Cognition  Cognition Arousal/Alertness: Awake/alert Behavior During Therapy: WFL for tasks assessed/performed Overall Cognitive Status: Within Functional Limits for tasks assessed    Extremity/Trunk Assessment Upper Extremity Assessment Upper Extremity Assessment: Overall WFL for tasks assessed Lower Extremity Assessment Lower Extremity Assessment: LLE deficits/detail LLE Deficits / Details: good ankle pump and quad set; able to SAQ with no lag; 30 degree lag with SLR Cervical / Trunk Assessment Cervical / Trunk Assessment: Normal   Balance    End of Session PT - End of Session Equipment Utilized During Treatment: Gait belt;Left knee immobilizer Activity Tolerance: Patient tolerated treatment well Patient left: in chair;with call bell/phone within reach Nurse Communication: Mobility status  GP     Ladona Ridgel 07/27/2013, 2:10 PM Gerlean Ren PT Acute Rehab Services [phone] [place] [phone]

## 2013-07-27 NOTE — Discharge Instructions (Signed)
Total Knee Replacement Care After Refer to this sheet in the next few weeks. These discharge instructions provide you with general information on caring for yourself after you leave the hospital. Your caregiver may also give you specific instructions. Your treatment has been planned according to the most current medical practices available, but unavoidable complications sometimes occur. If you have any problems or questions after discharge, please call your caregiver. Regaining a near full range of motion of your knee within the first 3 to 6 weeks after surgery is critical. [place] may resume a normal diet and activities as directed.  Perform exercises as directed.  Place gray foam block, curve side up under heel at all times except when in CPM or when walking.  DO NOT modify, tear, cut, or change in any way the gray foam block. You will receive physical therapy daily  Take showers instead of baths until informed otherwise.  You may shower on Sunday.  Please wash whole leg including wound with soap and water  Change bandages (dressings)daily It is OK to take over-the-counter tylenol in addition to the oxycodone for pain, discomfort, or fever. Oxycodone is VERY constipating.  Please take stool softener twice a day and laxatives daily until bowels are regular Eat a well-balanced diet.  Avoid lifting or driving until you are instructed otherwise.  Make an appointment to see your caregiver for stitches (suture) or staple removal as directed.  If you have been sent home with a continuous passive motion machine (CPM machine), 0-90 degrees 6 hrs a day   2 hrs a shift SEEK MEDICAL CARE IF: You have swelling of your calf or leg.  You develop shortness of breath or chest pain.  You have redness, swelling, or increasing pain in the wound.  There is pus or any unusual drainage coming from the surgical site.  You notice a bad smell coming from the surgical site or dressing.  The surgical  site breaks open after sutures or staples have been removed.  There is persistent bleeding from the suture or staple line.  You are getting worse or are not improving.  You have any other questions or concerns.  SEEK IMMEDIATE MEDICAL CARE IF:  You have a fever.  You develop a rash.  You have difficulty breathing.  You develop any reaction or side effects to medicines given.  Your knee motion is decreasing rather than improving.  MAKE SURE YOU:  Understand these instructions.  Will watch your condition.  Will get help right away if you are not doing well or get worse.   Home Health to be provided by [facility] [phone]

## 2013-07-27 NOTE — Progress Notes (Signed)
Physical Therapy Treatment Patient Details Name: Victor Estes MRN: [id number] DOB: Mar 08, 1946 Today's Date: 07/27/2013 Time: 4403-4742 PT Time Calculation (min): 10 min  PT Assessment / Plan / Recommendation  History of Present Illness Pt. underwent L TKA for endstage OA   PT Comments   Pt. Recently back to bed and fatigued.  Politely declined to get back OOB but willing to do exercises in the bed.  Now able to SLR with no lag.  Progressing nicely .  Should be able to complete goals with am session.    Follow Up Recommendations  Home health PT;Supervision/Assistance - 24 hour     Does the patient have the potential to tolerate intense rehabilitation     Barriers to Discharge        Equipment Recommendations  None recommended by PT    Recommendations for Other Services    Frequency 7X/week   Progress towards PT Goals Progress towards PT goals: Progressing toward goals  Plan Current plan remains appropriate    Precautions / Restrictions Precautions Precautions: Knee Precaution Booklet Issued: Yes (comment) Precaution Comments: pt. was provided exercise handout and educated on precautions Required Braces or Orthoses: Knee Immobilizer - Left Knee Immobilizer - Left: Other (comment) (until Dc'd) Restrictions Weight Bearing Restrictions: Yes LLE Weight Bearing: Weight bearing as tolerated   Pertinent Vitals/Pain See vitals tab Pain not a limiting factor this pm    Mobility  Bed Mobility Overal bed mobility:  (pt. declined) General bed mobility comments: use of bedrail and hob up about 30 degrees, pt at mod I level Transfers Overall transfer level: Needs assistance Equipment used: Rolling walker (2 wheeled) Transfers: Sit to/from Stand Sit to Stand: Min assist General transfer comment:  (pt. declined) Ambulation/Gait Ambulation/Gait assistance:  (pt. declined) Ambulation Distance (Feet): 60 Feet Assistive device: Rolling walker (2 wheeled) Gait  Pattern/deviations: Step-to pattern General Gait Details: pt. needed cues to WBAT and rely less on UEs, initial sequencing cues; min assist for safety and stability    Exercises Total Joint Exercises Ankle Circles/Pumps: AROM;Both;10 reps Quad Sets: AROM;Both;10 reps Gluteal Sets: AROM;Both;10 reps Short Arc Quad: AROM;Left;10 reps Heel Slides: AROM;10 reps;Left Hip ABduction/ADduction: AROM;Left;10 reps Straight Leg Raises: AROM;Left;10 reps Knee Flexion: AROM;Left;5 reps;Seated Goniometric ROM: 0 to 85   PT Diagnosis: Difficulty walking;Acute pain;Generalized weakness  PT Problem List: Decreased strength;Decreased range of motion;Decreased activity tolerance;Decreased mobility;Decreased knowledge of use of DME;Decreased knowledge of precautions;Pain PT Treatment Interventions: DME instruction;Gait training;Stair training;Functional mobility training;Therapeutic activities;Therapeutic exercise;Patient/family education   PT Goals (current goals can now be found in the care plan section) Acute Rehab PT Goals Patient Stated Goal: resume normal level of activity (acitve) PT Goal Formulation: With patient Time For Goal Achievement: 08/03/13 Potential to Achieve Goals: Good  Visit Information  Last PT Received On: 07/27/13 Assistance Needed: +1 History of Present Illness: Pt. underwent L TKA for endstage OA    Subjective Data  Subjective: Pt. in bed with lights off and door closed.  States he is tired from the activities of the am, but willing to do exercises.  He preferred not to get back  up OOB but will do so later. Patient Stated Goal: resume normal level of activity (acitve)   Cognition  Cognition Arousal/Alertness: Awake/alert Behavior During Therapy: WFL for tasks assessed/performed Overall Cognitive Status: Within Functional Limits for tasks assessed    Balance     End of Session PT - End of Session Equipment Utilized During Treatment: Gait belt;Left knee  immobilizer Activity Tolerance: Patient  tolerated treatment well Patient left: in bed;with call bell/phone within reach Nurse Communication: Mobility status   GP     Ladona Ridgel 07/27/2013, 3:25 PM Gerlean Ren PT Acute Rehab Services [place] [phone]

## 2013-07-27 NOTE — Care Management Note (Signed)
CARE MANAGEMENT NOTE 07/27/2013  Patient:  Victor Estes, Victor Estes   Account Number:  [id number]  Date Initiated:  07/27/2013  Documentation initiated by:  Ricki Miller  Subjective/Objective Assessment:   68 yr old left total knee arthroplasty.     Action/Plan:   Patient preoperatively setup with [facility], no changes. Patient has rolling walker, 3in1.   Anticipated DC Date:  07/28/2013   Anticipated DC Plan:  [place]  CM consult      [facility] Choice  HOME HEALTH  DURABLE MEDICAL EQUIPMENT   Choice offered to / List presented to:  C-1 Patient   DME arranged  CPM      DME agency  [employer]     [place] arranged  HH-2 PT      [place]   Status of service:  Completed, signed off Medicare Important Message given?   (If response is "NO", the following Medicare IM given date fields will be blank) Date Medicare IM given:   Date Additional Medicare IM given:    Discharge Disposition:  [place]

## 2013-07-28 LAB — BASIC METABOLIC PANEL
BUN: 21 mg/dL (ref 6–23)
CHLORIDE: 101 meq/L (ref 96–112)
CO2: 23 meq/L (ref 19–32)
CREATININE: 1 mg/dL (ref 0.50–1.35)
Calcium: 8.9 mg/dL (ref 8.4–10.5)
GFR calc Af Amer: 88 mL/min — ABNORMAL LOW (ref >90)
GFR calc non Af Amer: 76 mL/min — ABNORMAL LOW (ref >90)
Glucose, Bld: 191 mg/dL — ABNORMAL HIGH (ref 70–99)
Potassium: 4.8 mEq/L (ref 3.7–5.3)
Sodium: 138 mEq/L (ref 137–147)

## 2013-07-28 LAB — CBC
HEMATOCRIT: 31 % — AB (ref 39.0–52.0)
HEMOGLOBIN: 10.6 g/dL — AB (ref 13.0–17.0)
MCH: 31.4 pg (ref 26.0–34.0)
MCHC: 34.2 g/dL (ref 30.0–36.0)
MCV: 91.7 fL (ref 78.0–100.0)
Platelets: 237 10*3/uL (ref 150–400)
RBC: 3.38 MIL/uL — AB (ref 4.22–5.81)
RDW: 12.9 % (ref 11.5–15.5)
WBC: 23.1 10*3/uL — AB (ref 4.0–10.5)

## 2013-07-28 MED ORDER — OXYCODONE-ACETAMINOPHEN 5-325 MG PO TABS: 1.0000 | ORAL_TABLET | ORAL | Status: DC | PRN

## 2013-07-28 MED ORDER — ASPIRIN 325 MG PO TBEC: 325.0000 mg | DELAYED_RELEASE_TABLET | Freq: Every day | ORAL | Status: DC

## 2013-07-28 NOTE — Progress Notes (Signed)
Physical Therapy Treatment Patient Details Name: Victor Estes MRN: [id number] DOB: [birth year] Today's Date: 07/28/2013 Time: 0737-1062 PT Time Calculation (min): 19 min  PT Assessment / Plan / Recommendation  History of Present Illness Pt. underwent L TKA for endstage OA   PT Comments   Pt making great progress with mobility/PT goals.  Very motivated.  Increased ambulation distance & educated/practiced steps this session.     Follow Up Recommendations  Home health PT;Supervision/Assistance - 24 hour     Does the patient have the potential to tolerate intense rehabilitation     Barriers to Discharge        Equipment Recommendations  None recommended by PT    Recommendations for Other Services    Frequency 7X/week   Progress towards PT Goals Progress towards PT goals: Progressing toward goals  Plan Current plan remains appropriate    Precautions / Restrictions Precautions Precautions: Knee Required Braces or Orthoses: Knee Immobilizer - Left Knee Immobilizer - Left: Other (comment) (until d/c'd) Restrictions LLE Weight Bearing: Weight bearing as tolerated   Pertinent Vitals/Pain 2/10 Lt knee.  "it's just sore"    Mobility  Bed Mobility Overal bed mobility: Modified Independent Transfers Overall transfer level: Needs assistance Equipment used: Rolling walker (2 wheeled) Transfers: Sit to/from Stand Sit to Stand: Supervision General transfer comment: cues for hand placement Ambulation/Gait Ambulation/Gait assistance: Supervision Ambulation Distance (Feet): 180 Feet Assistive device: Rolling walker (2 wheeled) Gait Pattern/deviations: Step-through pattern General Gait Details: cues to slow down & focus on quad activation for knee stability during stance phase.  Pt tends to move quickly.   Stairs: Yes Stairs assistance: Min assist Stair Management: No rails;Backwards;With walker Number of Stairs: 3 (2x's) General stair comments: cues for sequencing &  technique.  Practiced 2x's.  Handout provided & placed in pt's room    Exercises Total Joint Exercises Ankle Circles/Pumps: AROM;Both;15 reps Quad Sets: AROM;Strengthening;Both;15 reps Heel Slides: AROM;Strengthening;Left;15 reps Hip ABduction/ADduction: AROM;Strengthening;Left;15 reps Straight Leg Raises: AROM;Strengthening;Left;15 reps Long Arc Quad: AROM;Strengthening;Left;15 reps      PT Goals (current goals can now be found in the care plan section) Acute Rehab PT Goals PT Goal Formulation: With patient Time For Goal Achievement: 08/03/13 Potential to Achieve Goals: Good  Visit Information  Last PT Received On: 07/28/13 Assistance Needed: +1 History of Present Illness: Pt. underwent L TKA for endstage OA    Subjective Data      Cognition  Cognition Arousal/Alertness: Awake/alert Behavior During Therapy: WFL for tasks assessed/performed Overall Cognitive Status: Within Functional Limits for tasks assessed    Balance     End of Session PT - End of Session Activity Tolerance: Patient tolerated treatment well Patient left: in chair;with call bell/phone within reach;with family/visitor present Nurse Communication: Mobility status   GP     Sena Hitch 07/28/2013, 9:24 AM  Sarajane Marek, PTA [phone] 07/28/2013

## 2013-07-28 NOTE — Progress Notes (Addendum)
Subjective: 2 Days Post-Op Procedure(s) (LRB): TOTAL KNEE ARTHROPLASTY (Left) Patient reports pain as 2 on 0-10 scale.  Patient doing very well with pain control as well as with PT.  Positive flatus, no bm as of yet.  No nausea/vomiting.  Good appetite.  Objective: Vital signs in last 24 hours: Temp:  [97.7 F (36.5 C)-97.9 F (36.6 C)] 97.9 F (36.6 C) (01/30 0542) Pulse Rate:  [66-86] 66 (01/30 0542) Resp:  [16] 16 (01/30 0542) BP: (104-123)/(61-77) 104/61 mmHg (01/30 0542) SpO2:  [92 %-96 %] 92 % (01/30 0542)  Intake/Output from previous day: 01/29 0701 - 01/30 0700 In: -  Out: 1300 [Urine:1300] Intake/Output this shift:     Recent Labs  07/27/13 0425 07/28/13 0533  HGB 12.0* 10.6*    Recent Labs  07/27/13 0425 07/28/13 0533  WBC 12.7* 23.1*  RBC 3.84* 3.38*  HCT 35.3* 31.0*  PLT 229 237    Recent Labs  07/27/13 0425 07/28/13 0533  NA 138 138  K 4.4 4.8  CL 100 101  CO2 24 23  BUN 14 21  CREATININE 0.95 1.00  GLUCOSE 199* 191*  CALCIUM 8.4 8.9   No results found for this basename: LABPT, INR,  in the last 72 hours  Neurologically intact ABD soft Neurovascular intact Sensation intact distally Intact pulses distally Dorsiflexion/Plantar flexion intact Incision: scant drainage No cellulitis present Compartment soft Dressing changed by me today  Assessment/Plan: 2 Days Post-Op Procedure(s) (LRB): TOTAL KNEE ARTHROPLASTY (Left) Advance diet Up with therapy Discharge home with home health  [place], M. LINDSEY 07/28/2013, 8:08 AM

## 2013-07-28 NOTE — Progress Notes (Signed)
OT Cancellation Note and Discharge  Patient Details Name: Victor Estes MRN: [id number] DOB: [birth year]   Cancelled Treatment:    Reason Eval/Treat Not Completed: OT screened, no needs identified, will sign off. Pt reports that his wife will A him prn with anything that he needs. Pt does not want a 3n1. I did demonstrate to him how he would step into the tub on Monday (when the MD says he can take a shower)--In with the good, out with the bad. Pt says he does not have any further BADL questions.  Almon Register [phone] 07/28/2013, 8:43 AM

## 2013-07-29 DIAGNOSIS — Z96659 Presence of unspecified artificial knee joint: Secondary | ICD-10-CM | POA: Diagnosis not present

## 2013-07-29 DIAGNOSIS — Z471 Aftercare following joint replacement surgery: Secondary | ICD-10-CM | POA: Diagnosis not present

## 2013-07-29 DIAGNOSIS — M1710 Unilateral primary osteoarthritis, unspecified knee: Secondary | ICD-10-CM | POA: Diagnosis not present

## 2013-07-29 DIAGNOSIS — E785 Hyperlipidemia, unspecified: Secondary | ICD-10-CM | POA: Diagnosis not present

## 2013-07-29 DIAGNOSIS — I10 Essential (primary) hypertension: Secondary | ICD-10-CM | POA: Diagnosis not present

## 2013-07-29 DIAGNOSIS — I2510 Atherosclerotic heart disease of native coronary artery without angina pectoris: Secondary | ICD-10-CM | POA: Diagnosis not present

## 2013-07-29 DIAGNOSIS — Z5189 Encounter for other specified aftercare: Secondary | ICD-10-CM | POA: Diagnosis not present

## 2013-07-31 DIAGNOSIS — Z471 Aftercare following joint replacement surgery: Secondary | ICD-10-CM | POA: Diagnosis not present

## 2013-07-31 DIAGNOSIS — E785 Hyperlipidemia, unspecified: Secondary | ICD-10-CM | POA: Diagnosis not present

## 2013-07-31 DIAGNOSIS — Z96659 Presence of unspecified artificial knee joint: Secondary | ICD-10-CM | POA: Diagnosis not present

## 2013-07-31 DIAGNOSIS — Z5189 Encounter for other specified aftercare: Secondary | ICD-10-CM | POA: Diagnosis not present

## 2013-07-31 DIAGNOSIS — I10 Essential (primary) hypertension: Secondary | ICD-10-CM | POA: Diagnosis not present

## 2013-07-31 DIAGNOSIS — I2510 Atherosclerotic heart disease of native coronary artery without angina pectoris: Secondary | ICD-10-CM | POA: Diagnosis not present

## 2013-08-01 DIAGNOSIS — Z471 Aftercare following joint replacement surgery: Secondary | ICD-10-CM | POA: Diagnosis not present

## 2013-08-01 DIAGNOSIS — I2510 Atherosclerotic heart disease of native coronary artery without angina pectoris: Secondary | ICD-10-CM | POA: Diagnosis not present

## 2013-08-01 DIAGNOSIS — I10 Essential (primary) hypertension: Secondary | ICD-10-CM | POA: Diagnosis not present

## 2013-08-01 DIAGNOSIS — E785 Hyperlipidemia, unspecified: Secondary | ICD-10-CM | POA: Diagnosis not present

## 2013-08-01 DIAGNOSIS — Z5189 Encounter for other specified aftercare: Secondary | ICD-10-CM | POA: Diagnosis not present

## 2013-08-01 DIAGNOSIS — Z96659 Presence of unspecified artificial knee joint: Secondary | ICD-10-CM | POA: Diagnosis not present

## 2013-08-02 DIAGNOSIS — E785 Hyperlipidemia, unspecified: Secondary | ICD-10-CM | POA: Diagnosis not present

## 2013-08-02 DIAGNOSIS — Z5189 Encounter for other specified aftercare: Secondary | ICD-10-CM | POA: Diagnosis not present

## 2013-08-02 DIAGNOSIS — I2510 Atherosclerotic heart disease of native coronary artery without angina pectoris: Secondary | ICD-10-CM | POA: Diagnosis not present

## 2013-08-02 DIAGNOSIS — Z96659 Presence of unspecified artificial knee joint: Secondary | ICD-10-CM | POA: Diagnosis not present

## 2013-08-02 DIAGNOSIS — I10 Essential (primary) hypertension: Secondary | ICD-10-CM | POA: Diagnosis not present

## 2013-08-02 DIAGNOSIS — Z471 Aftercare following joint replacement surgery: Secondary | ICD-10-CM | POA: Diagnosis not present

## 2013-08-03 DIAGNOSIS — Z5189 Encounter for other specified aftercare: Secondary | ICD-10-CM | POA: Diagnosis not present

## 2013-08-03 DIAGNOSIS — E785 Hyperlipidemia, unspecified: Secondary | ICD-10-CM | POA: Diagnosis not present

## 2013-08-03 DIAGNOSIS — Z471 Aftercare following joint replacement surgery: Secondary | ICD-10-CM | POA: Diagnosis not present

## 2013-08-03 DIAGNOSIS — I10 Essential (primary) hypertension: Secondary | ICD-10-CM | POA: Diagnosis not present

## 2013-08-03 DIAGNOSIS — I2510 Atherosclerotic heart disease of native coronary artery without angina pectoris: Secondary | ICD-10-CM | POA: Diagnosis not present

## 2013-08-03 DIAGNOSIS — Z96659 Presence of unspecified artificial knee joint: Secondary | ICD-10-CM | POA: Diagnosis not present

## 2013-08-04 DIAGNOSIS — I2510 Atherosclerotic heart disease of native coronary artery without angina pectoris: Secondary | ICD-10-CM | POA: Diagnosis not present

## 2013-08-04 DIAGNOSIS — Z96659 Presence of unspecified artificial knee joint: Secondary | ICD-10-CM | POA: Diagnosis not present

## 2013-08-04 DIAGNOSIS — Z5189 Encounter for other specified aftercare: Secondary | ICD-10-CM | POA: Diagnosis not present

## 2013-08-04 DIAGNOSIS — E785 Hyperlipidemia, unspecified: Secondary | ICD-10-CM | POA: Diagnosis not present

## 2013-08-04 DIAGNOSIS — Z471 Aftercare following joint replacement surgery: Secondary | ICD-10-CM | POA: Diagnosis not present

## 2013-08-04 DIAGNOSIS — I10 Essential (primary) hypertension: Secondary | ICD-10-CM | POA: Diagnosis not present

## 2013-08-07 DIAGNOSIS — I10 Essential (primary) hypertension: Secondary | ICD-10-CM | POA: Diagnosis not present

## 2013-08-07 DIAGNOSIS — E785 Hyperlipidemia, unspecified: Secondary | ICD-10-CM | POA: Diagnosis not present

## 2013-08-07 DIAGNOSIS — Z5189 Encounter for other specified aftercare: Secondary | ICD-10-CM | POA: Diagnosis not present

## 2013-08-07 DIAGNOSIS — Z471 Aftercare following joint replacement surgery: Secondary | ICD-10-CM | POA: Diagnosis not present

## 2013-08-07 DIAGNOSIS — Z96659 Presence of unspecified artificial knee joint: Secondary | ICD-10-CM | POA: Diagnosis not present

## 2013-08-07 DIAGNOSIS — I2510 Atherosclerotic heart disease of native coronary artery without angina pectoris: Secondary | ICD-10-CM | POA: Diagnosis not present

## 2013-08-08 DIAGNOSIS — Z471 Aftercare following joint replacement surgery: Secondary | ICD-10-CM | POA: Diagnosis not present

## 2013-08-08 DIAGNOSIS — Z96659 Presence of unspecified artificial knee joint: Secondary | ICD-10-CM | POA: Diagnosis not present

## 2013-08-08 DIAGNOSIS — I2510 Atherosclerotic heart disease of native coronary artery without angina pectoris: Secondary | ICD-10-CM | POA: Diagnosis not present

## 2013-08-08 DIAGNOSIS — I10 Essential (primary) hypertension: Secondary | ICD-10-CM | POA: Diagnosis not present

## 2013-08-08 DIAGNOSIS — Z5189 Encounter for other specified aftercare: Secondary | ICD-10-CM | POA: Diagnosis not present

## 2013-08-08 DIAGNOSIS — E785 Hyperlipidemia, unspecified: Secondary | ICD-10-CM | POA: Diagnosis not present

## 2013-08-09 DIAGNOSIS — Z5189 Encounter for other specified aftercare: Secondary | ICD-10-CM | POA: Diagnosis not present

## 2013-08-09 DIAGNOSIS — Z96659 Presence of unspecified artificial knee joint: Secondary | ICD-10-CM | POA: Diagnosis not present

## 2013-08-09 DIAGNOSIS — I10 Essential (primary) hypertension: Secondary | ICD-10-CM | POA: Diagnosis not present

## 2013-08-09 DIAGNOSIS — E785 Hyperlipidemia, unspecified: Secondary | ICD-10-CM | POA: Diagnosis not present

## 2013-08-09 DIAGNOSIS — I2510 Atherosclerotic heart disease of native coronary artery without angina pectoris: Secondary | ICD-10-CM | POA: Diagnosis not present

## 2013-08-09 DIAGNOSIS — Z471 Aftercare following joint replacement surgery: Secondary | ICD-10-CM | POA: Diagnosis not present

## 2013-08-10 DIAGNOSIS — E785 Hyperlipidemia, unspecified: Secondary | ICD-10-CM | POA: Diagnosis not present

## 2013-08-10 DIAGNOSIS — Z471 Aftercare following joint replacement surgery: Secondary | ICD-10-CM | POA: Diagnosis not present

## 2013-08-10 DIAGNOSIS — I10 Essential (primary) hypertension: Secondary | ICD-10-CM | POA: Diagnosis not present

## 2013-08-10 DIAGNOSIS — Z5189 Encounter for other specified aftercare: Secondary | ICD-10-CM | POA: Diagnosis not present

## 2013-08-10 DIAGNOSIS — I2510 Atherosclerotic heart disease of native coronary artery without angina pectoris: Secondary | ICD-10-CM | POA: Diagnosis not present

## 2013-08-10 DIAGNOSIS — Z96659 Presence of unspecified artificial knee joint: Secondary | ICD-10-CM | POA: Diagnosis not present

## 2013-08-11 DIAGNOSIS — Z96659 Presence of unspecified artificial knee joint: Secondary | ICD-10-CM | POA: Diagnosis not present

## 2013-08-11 DIAGNOSIS — Z471 Aftercare following joint replacement surgery: Secondary | ICD-10-CM | POA: Diagnosis not present

## 2013-08-11 DIAGNOSIS — Z5189 Encounter for other specified aftercare: Secondary | ICD-10-CM | POA: Diagnosis not present

## 2013-08-11 DIAGNOSIS — I10 Essential (primary) hypertension: Secondary | ICD-10-CM | POA: Diagnosis not present

## 2013-08-11 DIAGNOSIS — I2510 Atherosclerotic heart disease of native coronary artery without angina pectoris: Secondary | ICD-10-CM | POA: Diagnosis not present

## 2013-08-11 DIAGNOSIS — E785 Hyperlipidemia, unspecified: Secondary | ICD-10-CM | POA: Diagnosis not present

## 2013-08-14 DIAGNOSIS — E785 Hyperlipidemia, unspecified: Secondary | ICD-10-CM | POA: Diagnosis not present

## 2013-08-14 DIAGNOSIS — I10 Essential (primary) hypertension: Secondary | ICD-10-CM | POA: Diagnosis not present

## 2013-08-14 DIAGNOSIS — Z5189 Encounter for other specified aftercare: Secondary | ICD-10-CM | POA: Diagnosis not present

## 2013-08-14 DIAGNOSIS — Z96659 Presence of unspecified artificial knee joint: Secondary | ICD-10-CM | POA: Diagnosis not present

## 2013-08-14 DIAGNOSIS — I2510 Atherosclerotic heart disease of native coronary artery without angina pectoris: Secondary | ICD-10-CM | POA: Diagnosis not present

## 2013-08-14 DIAGNOSIS — Z471 Aftercare following joint replacement surgery: Secondary | ICD-10-CM | POA: Diagnosis not present

## 2013-08-16 DIAGNOSIS — E785 Hyperlipidemia, unspecified: Secondary | ICD-10-CM | POA: Diagnosis not present

## 2013-08-16 DIAGNOSIS — I2510 Atherosclerotic heart disease of native coronary artery without angina pectoris: Secondary | ICD-10-CM | POA: Diagnosis not present

## 2013-08-16 DIAGNOSIS — I10 Essential (primary) hypertension: Secondary | ICD-10-CM | POA: Diagnosis not present

## 2013-08-16 DIAGNOSIS — Z5189 Encounter for other specified aftercare: Secondary | ICD-10-CM | POA: Diagnosis not present

## 2013-08-16 DIAGNOSIS — Z471 Aftercare following joint replacement surgery: Secondary | ICD-10-CM | POA: Diagnosis not present

## 2013-08-16 DIAGNOSIS — Z96659 Presence of unspecified artificial knee joint: Secondary | ICD-10-CM | POA: Diagnosis not present

## 2013-08-18 ENCOUNTER — Ambulatory Visit: Payer: Medicare Other | Attending: Orthopedic Surgery | Admitting: Physical Therapy

## 2013-08-18 DIAGNOSIS — I10 Essential (primary) hypertension: Secondary | ICD-10-CM | POA: Insufficient documentation

## 2013-08-18 DIAGNOSIS — M25669 Stiffness of unspecified knee, not elsewhere classified: Secondary | ICD-10-CM | POA: Diagnosis not present

## 2013-08-18 DIAGNOSIS — IMO0001 Reserved for inherently not codable concepts without codable children: Secondary | ICD-10-CM | POA: Insufficient documentation

## 2013-08-18 DIAGNOSIS — M25519 Pain in unspecified shoulder: Secondary | ICD-10-CM | POA: Insufficient documentation

## 2013-08-18 DIAGNOSIS — R609 Edema, unspecified: Secondary | ICD-10-CM | POA: Insufficient documentation

## 2013-08-18 DIAGNOSIS — M25569 Pain in unspecified knee: Secondary | ICD-10-CM | POA: Insufficient documentation

## 2013-08-18 DIAGNOSIS — R791 Abnormal coagulation profile: Secondary | ICD-10-CM | POA: Diagnosis not present

## 2013-08-22 ENCOUNTER — Ambulatory Visit: Payer: Medicare Other | Admitting: Physical Therapy

## 2013-08-25 ENCOUNTER — Ambulatory Visit: Payer: Medicare Other | Admitting: Physical Therapy

## 2013-08-29 ENCOUNTER — Ambulatory Visit: Payer: Medicare Other | Attending: Orthopedic Surgery | Admitting: Physical Therapy

## 2013-08-29 DIAGNOSIS — R609 Edema, unspecified: Secondary | ICD-10-CM | POA: Diagnosis not present

## 2013-08-29 DIAGNOSIS — R791 Abnormal coagulation profile: Secondary | ICD-10-CM | POA: Diagnosis not present

## 2013-08-29 DIAGNOSIS — M25569 Pain in unspecified knee: Secondary | ICD-10-CM | POA: Diagnosis not present

## 2013-08-29 DIAGNOSIS — IMO0001 Reserved for inherently not codable concepts without codable children: Secondary | ICD-10-CM | POA: Diagnosis not present

## 2013-08-29 DIAGNOSIS — I10 Essential (primary) hypertension: Secondary | ICD-10-CM | POA: Insufficient documentation

## 2013-08-29 DIAGNOSIS — M25519 Pain in unspecified shoulder: Secondary | ICD-10-CM | POA: Diagnosis not present

## 2013-08-29 DIAGNOSIS — M25669 Stiffness of unspecified knee, not elsewhere classified: Secondary | ICD-10-CM | POA: Insufficient documentation

## 2013-08-31 ENCOUNTER — Ambulatory Visit: Payer: Medicare Other | Admitting: Physical Therapy

## 2013-09-05 ENCOUNTER — Ambulatory Visit: Payer: Medicare Other | Admitting: Physical Therapy

## 2013-09-05 DIAGNOSIS — Z96659 Presence of unspecified artificial knee joint: Secondary | ICD-10-CM | POA: Diagnosis not present

## 2013-09-05 DIAGNOSIS — Z471 Aftercare following joint replacement surgery: Secondary | ICD-10-CM | POA: Diagnosis not present

## 2013-09-07 ENCOUNTER — Ambulatory Visit: Payer: Medicare Other | Admitting: Physical Therapy

## 2013-09-12 ENCOUNTER — Ambulatory Visit: Payer: Medicare Other | Admitting: Physical Therapy

## 2013-09-14 ENCOUNTER — Ambulatory Visit: Payer: Medicare Other | Admitting: Physical Therapy

## 2013-09-19 ENCOUNTER — Ambulatory Visit: Payer: Medicare Other | Admitting: Physical Therapy

## 2013-09-21 ENCOUNTER — Ambulatory Visit: Payer: Medicare Other | Admitting: Physical Therapy

## 2013-09-26 ENCOUNTER — Encounter: Payer: Medicare Other | Admitting: Physical Therapy

## 2013-09-26 ENCOUNTER — Ambulatory Visit: Payer: Medicare Other | Admitting: Physical Therapy

## 2013-09-28 ENCOUNTER — Ambulatory Visit: Payer: Medicare Other | Attending: Orthopedic Surgery | Admitting: Physical Therapy

## 2013-09-28 DIAGNOSIS — M25569 Pain in unspecified knee: Secondary | ICD-10-CM | POA: Insufficient documentation

## 2013-09-28 DIAGNOSIS — R609 Edema, unspecified: Secondary | ICD-10-CM | POA: Insufficient documentation

## 2013-09-28 DIAGNOSIS — M25519 Pain in unspecified shoulder: Secondary | ICD-10-CM | POA: Insufficient documentation

## 2013-09-28 DIAGNOSIS — IMO0001 Reserved for inherently not codable concepts without codable children: Secondary | ICD-10-CM | POA: Diagnosis not present

## 2013-09-28 DIAGNOSIS — R791 Abnormal coagulation profile: Secondary | ICD-10-CM | POA: Diagnosis not present

## 2013-09-28 DIAGNOSIS — I10 Essential (primary) hypertension: Secondary | ICD-10-CM | POA: Diagnosis not present

## 2013-09-28 DIAGNOSIS — M25669 Stiffness of unspecified knee, not elsewhere classified: Secondary | ICD-10-CM | POA: Insufficient documentation

## 2013-10-03 ENCOUNTER — Ambulatory Visit: Payer: Medicare Other | Admitting: Physical Therapy

## 2013-10-05 ENCOUNTER — Ambulatory Visit: Payer: Medicare Other | Admitting: Physical Therapy

## 2013-10-10 ENCOUNTER — Ambulatory Visit: Payer: Medicare Other | Admitting: Physical Therapy

## 2013-10-12 ENCOUNTER — Ambulatory Visit: Payer: Medicare Other | Admitting: Physical Therapy

## 2013-10-13 ENCOUNTER — Encounter: Payer: Medicare Other | Admitting: Physical Therapy

## 2013-10-17 DIAGNOSIS — M25569 Pain in unspecified knee: Secondary | ICD-10-CM | POA: Diagnosis not present

## 2014-03-30 ENCOUNTER — Telehealth (HOSPITAL_COMMUNITY): Payer: Self-pay | Admitting: *Deleted

## 2014-04-03 ENCOUNTER — Ambulatory Visit (INDEPENDENT_AMBULATORY_CARE_PROVIDER_SITE_OTHER): Payer: Medicare Other | Admitting: Cardiovascular Disease

## 2014-04-03 ENCOUNTER — Encounter: Payer: Self-pay | Admitting: Cardiovascular Disease

## 2014-04-03 VITALS — BP 108/62 | HR 46 | Ht 68.0 in | Wt 195.7 lb

## 2014-04-03 DIAGNOSIS — I2510 Atherosclerotic heart disease of native coronary artery without angina pectoris: Secondary | ICD-10-CM | POA: Diagnosis not present

## 2014-04-03 DIAGNOSIS — E785 Hyperlipidemia, unspecified: Secondary | ICD-10-CM | POA: Diagnosis not present

## 2014-04-03 DIAGNOSIS — I2583 Coronary atherosclerosis due to lipid rich plaque: Principal | ICD-10-CM

## 2014-04-03 DIAGNOSIS — I779 Disorder of arteries and arterioles, unspecified: Secondary | ICD-10-CM

## 2014-04-03 DIAGNOSIS — I10 Essential (primary) hypertension: Secondary | ICD-10-CM

## 2014-04-03 DIAGNOSIS — I739 Peripheral vascular disease, unspecified: Secondary | ICD-10-CM

## 2014-04-03 NOTE — Progress Notes (Signed)
04/03/2014 Victor Estes   08-06-1945  [id number]  Primary Physician Pcp Not In System Primary Cardiologist: Lorretta Harp MD Renae Gloss   HPI:  The patient is a delightful 68 year old, mildly overweight, married Caucasian male, father of 64, grandfather to 5 grandchildren who I have been taking care of for the last 22 years. He had an MI back in 1993 and subsequent coronary artery bypass grafting x4 in 1995. His risk factors are remarkable for remote tobacco abuse, hypertension, hyperlipidemia, and family history. His last Myoview performed 2 year ago showed inferior scar without ischemia, unchanged from prior studies. A 2D echo revealed an EF of 45% to 50% with inferior and septal hypokinesia. Carotid Dopplers showed moderate right ICA stenosis unchanged from prior studies. He is neurologically asymptomatic. Recent [facility] [place] 03/31/13 revealed a total cholesterol of 141, LDL 61 HDL 63. He denies chest pain or shortness of breath. He had  elective TKR replacement by Dr. Percell Miller 07/05/13. I obtained a Myoview stress test 03/29/13 that showed impaired scar without ischemia. The epicardium slightly to 40% by quantitative gated SPECT. A 2-D echocardiogram performed 05/24/13 revealed an ejection fraction of 40-45%.   Current Outpatient Prescriptions  Medication Sig Dispense Refill  . aspirin EC 325 MG EC tablet Take 1 tablet (325 mg total) by mouth daily with breakfast.  30 tablet  0  . aspirin EC 325 MG tablet Take 1 tablet (325 mg total) by mouth daily.  30 tablet  0  . Cholecalciferol 2000 UNITS TABS Take 2,000 Units by mouth daily.      . clopidogrel (PLAVIX) 75 MG tablet Take 1 tablet (75 mg total) by mouth daily.  30 tablet  11  . ezetimibe (ZETIA) 10 MG tablet Take 1 tablet (10 mg total) by mouth daily.  30 tablet  11  . lisinopril (PRINIVIL,ZESTRIL) 40 MG tablet Take 20 mg by mouth every morning.      . methocarbamol (ROBAXIN)  500 MG tablet Take 1 tablet (500 mg total) by mouth every 6 (six) hours as needed for muscle spasms.  90 tablet  1  . metoprolol tartrate (LOPRESSOR) 25 MG tablet Take 25 mg by mouth 2 (two) times daily.       [name] oxyCODONE-acetaminophen (PERCOCET/ROXICET) 5-325 MG per tablet Take 1-2 tablets by mouth every 4 (four) hours as needed for severe pain.  30 tablet  0  . pantoprazole (PROTONIX) 40 MG tablet Take 40 mg by mouth 2 (two) times daily.       . simvastatin (ZOCOR) 80 MG tablet Take 80 mg by mouth at bedtime.        . tamsulosin (FLOMAX) 0.4 MG CAPS capsule Take 0.4 mg by mouth daily after breakfast.       No current facility-administered medications for this visit.    Allergies  Allergen Reactions  . Clarithromycin Swelling    History   Social History  . Marital Status: Married    Spouse Name: N/A    Number of Children: N/A  . Years of Education: N/A   Occupational History  . Not on file.   Social History Main Topics  . Smoking status: Former Smoker    Quit date: 04/06/1983  . Smokeless tobacco: Not on file  . Alcohol Use: No  . Drug Use: No  . Sexual Activity: Not on file   Other Topics Concern  . Not on file   Social History Narrative  .  No narrative on file     Review of Systems: General: negative for chills, fever, night sweats or weight changes.  Cardiovascular: negative for chest pain, dyspnea on exertion, edema, orthopnea, palpitations, paroxysmal nocturnal dyspnea or shortness of breath Dermatological: negative for rash Respiratory: negative for cough or wheezing Urologic: negative for hematuria Abdominal: negative for nausea, vomiting, diarrhea, bright red blood per rectum, melena, or hematemesis Neurologic: negative for visual changes, syncope, or dizziness All other systems reviewed and are otherwise negative except as noted above.    Blood pressure 108/62, pulse 46, height 5\' 8"  (1.727 m), weight 195 lb 11.2 oz (88.769 kg).  General appearance:  alert and no distress Neck: no adenopathy, no JVD, supple, symmetrical, trachea midline, thyroid not enlarged, symmetric, no tenderness/mass/nodules and soft right carotid bruit Lungs: clear to auscultation bilaterally Heart: regular rate and rhythm, S1, S2 normal, no murmur, click, rub or gallop Extremities: extremities normal, atraumatic, no cyanosis or edema  EKG sinus bradycardia at 46 without ST or T wave changes  ASSESSMENT AND PLAN:   Coronary artery disease History of CAD status post myocardial infarction back in 1993 with subsequent coronary artery bypass grafting x4 in 1995. His last Myoview stress test 2 years ago showed inferior scar without ischemia unchanged from prior studies. A 2-D echo revealed an ejection fraction of 45-50% with inferior and septal hypokinesia. He denies chest pain or shortness of breath.  Essential hypertension Controlled on current medications  Hyperlipidemia On statin and Zetia with lipid profile followed at the [facility] in [place]  Carotid artery disease Moderate right ICA stenosis by duplex ultrasound last checked 04/25/13. He is neurologically asymptomatic on dual antiplatelet therapy. We'll continue to follow this on an annual basis.      Lorretta Harp MD FACP,FACC,FAHA, [facility] 04/03/2014 8:40 AM

## 2014-04-03 NOTE — Patient Instructions (Signed)
Your physician wants you to follow-up in: 1 year with Dr Berry. You will receive a reminder letter in the mail two months in advance. If you don't receive a letter, please call our office to schedule the follow-up appointment.  

## 2014-04-03 NOTE — Assessment & Plan Note (Signed)
History of CAD status post myocardial infarction back in 1993 with subsequent coronary artery bypass grafting x4 in 1995. His last Myoview stress test 2 years ago showed inferior scar without ischemia unchanged from prior studies. A 2-D echo revealed an ejection fraction of 45-50% with inferior and septal hypokinesia. He denies chest pain or shortness of breath.

## 2014-04-03 NOTE — Assessment & Plan Note (Signed)
Moderate right ICA stenosis by duplex ultrasound last checked 04/25/13. He is neurologically asymptomatic on dual antiplatelet therapy. We'll continue to follow this on an annual basis.

## 2014-04-03 NOTE — Assessment & Plan Note (Signed)
On statin and Zetia with lipid profile followed at the [facility] in [place]

## 2014-04-03 NOTE — Assessment & Plan Note (Signed)
Controlled on current medications 

## 2014-04-17 ENCOUNTER — Ambulatory Visit (HOSPITAL_COMMUNITY)
Admission: RE | Admit: 2014-04-17 | Discharge: 2014-04-17 | Disposition: A | Discharge status: Home / Self Care | Payer: Medicare Other | Source: Ambulatory Visit | Attending: Cardiovascular Disease | Admitting: Cardiovascular Disease

## 2014-04-17 DIAGNOSIS — I6529 Occlusion and stenosis of unspecified carotid artery: Secondary | ICD-10-CM

## 2014-04-17 DIAGNOSIS — I672 Cerebral atherosclerosis: Secondary | ICD-10-CM | POA: Diagnosis not present

## 2014-04-17 NOTE — Progress Notes (Signed)
Carotid Duplex Completed. °Brianna L Mazza,RVT °

## 2014-05-01 ENCOUNTER — Encounter: Payer: Self-pay | Admitting: *Deleted

## 2014-05-09 ENCOUNTER — Other Ambulatory Visit: Payer: Self-pay | Admitting: Cardiovascular Disease

## 2014-05-10 NOTE — Telephone Encounter (Signed)
Rx refill sent to patient pharmacy   

## 2014-10-02 DIAGNOSIS — M5416 Radiculopathy, lumbar region: Secondary | ICD-10-CM | POA: Diagnosis not present

## 2014-10-10 DIAGNOSIS — M5416 Radiculopathy, lumbar region: Secondary | ICD-10-CM | POA: Diagnosis not present

## 2014-10-11 DIAGNOSIS — M4806 Spinal stenosis, lumbar region: Secondary | ICD-10-CM | POA: Diagnosis not present

## 2014-10-11 DIAGNOSIS — Z135 Encounter for screening for eye and ear disorders: Secondary | ICD-10-CM | POA: Diagnosis not present

## 2014-10-11 DIAGNOSIS — M5136 Other intervertebral disc degeneration, lumbar region: Secondary | ICD-10-CM | POA: Diagnosis not present

## 2014-10-11 DIAGNOSIS — M5416 Radiculopathy, lumbar region: Secondary | ICD-10-CM | POA: Diagnosis not present

## 2014-10-23 DIAGNOSIS — M5416 Radiculopathy, lumbar region: Secondary | ICD-10-CM | POA: Diagnosis not present

## 2014-11-15 ENCOUNTER — Encounter: Payer: Self-pay | Admitting: Cardiovascular Disease

## 2014-11-20 DIAGNOSIS — S335XXA Sprain of ligaments of lumbar spine, initial encounter: Secondary | ICD-10-CM | POA: Diagnosis not present

## 2015-02-12 ENCOUNTER — Telehealth: Payer: Self-pay | Admitting: Cardiovascular Disease

## 2015-02-12 NOTE — Telephone Encounter (Signed)
Closed encounter °

## 2015-04-17 ENCOUNTER — Ambulatory Visit (INDEPENDENT_AMBULATORY_CARE_PROVIDER_SITE_OTHER): Payer: Medicare Other | Admitting: Cardiovascular Disease

## 2015-04-17 ENCOUNTER — Encounter: Payer: Self-pay | Admitting: Cardiovascular Disease

## 2015-04-17 DIAGNOSIS — E785 Hyperlipidemia, unspecified: Secondary | ICD-10-CM | POA: Diagnosis not present

## 2015-04-17 DIAGNOSIS — I10 Essential (primary) hypertension: Secondary | ICD-10-CM

## 2015-04-17 DIAGNOSIS — I6529 Occlusion and stenosis of unspecified carotid artery: Secondary | ICD-10-CM | POA: Diagnosis not present

## 2015-04-17 MED ORDER — EZETIMIBE 10 MG PO TABS: 10.0000 mg | ORAL_TABLET | Freq: Every day | ORAL | Status: DC

## 2015-04-17 MED ORDER — CLOPIDOGREL BISULFATE 75 MG PO TABS: 75.0000 mg | ORAL_TABLET | Freq: Every day | ORAL | Status: DC

## 2015-04-17 NOTE — Progress Notes (Signed)
04/17/2015 Victor Estes   1946/03/17  [id number]  Primary Physician Pcp Not In System Primary Cardiologist: Lorretta Harp MD Renae Gloss   HPI:  The patient is a delightful 69 year old, mildly overweight, married Caucasian male, father of 32, grandfather to 5 grandchildren who I have been taking care of for the last 23years. I last saw him in the office 04/03/14.He had an MI back in 1993 and subsequent coronary artery bypass grafting x4 in 1995. His risk factors are remarkable for remote tobacco abuse, hypertension, hyperlipidemia, and family history. His last Myoview performed 2 year ago showed inferior scar without ischemia, unchanged from prior studies. A 2D echo revealed an EF of 45% to 50% with inferior and septal hypokinesia. Carotid Dopplers showed moderate right ICA stenosis unchanged from prior studies. He is neurologically asymptomatic. Recent lab [facility] by[facility] in [place] 03/31/13 revealed a total cholesterol of 141, LDL 61 HDL 63. He denies chest pain or shortness of breath. He had elective TKR replacement by Dr. Percell Miller 07/05/13. I obtained a Myoview stress test 03/29/13 that showed impaired scar without ischemia. The epicardium slightly to 40% by quantitative gated SPECT. A 2-D echocardiogram performed 05/24/13 revealed an ejection fraction of 40-45%.. Since I saw him a year ago he's remained clinically stable. He [place] a rare episode [place] dizziness [place]. 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   Current Outpatient Prescriptions  Medication Sig Dispense Refill  . aspirin EC 325 MG EC tablet Take 1 tablet (325 mg total) by mouth daily with breakfast. 30 tablet 0  . aspirin EC 325 MG tablet Take 1 tablet (325 mg total) by mouth daily. 30 tablet 0  . Cholecalciferol 2000 UNITS TABS Take 2,000 Units by mouth daily.    . clopidogrel (PLAVIX) 75 MG tablet TAKE 1 TABLET BY MOUTH EVERY DAY 30 tablet 10  . lisinopril  (PRINIVIL,ZESTRIL) 40 MG tablet Take 20 mg by mouth every morning.    . methocarbamol (ROBAXIN) 500 MG tablet Take 1 tablet (500 mg total) by mouth every 6 (six) hours as needed for muscle spasms. 90 tablet 1  . metoprolol tartrate (LOPRESSOR) 25 MG tablet Take 25 mg by mouth 2 (two) times daily.     [name] oxyCODONE-acetaminophen (PERCOCET/ROXICET) 5-325 MG per tablet Take 1-2 tablets by mouth every 4 (four) hours as needed for severe pain. 30 tablet 0  . pantoprazole (PROTONIX) 40 MG tablet Take 40 mg by mouth 2 (two) times daily.     . simvastatin (ZOCOR) 80 MG tablet Take 80 mg by mouth at bedtime.      . tamsulosin (FLOMAX) 0.4 MG CAPS capsule Take 0.4 mg by mouth daily after breakfast.    . ZETIA 10 MG tablet TAKE 1 TABLET BY MOUTH DAILY 30 tablet 10   No current facility-administered medications for this visit.    Allergies  Allergen Reactions  . Clarithromycin Swelling    Social History   Social History  . Marital Status: Married    Spouse Name: N/A  . Number of Children: N/A  . Years of Education: N/A   Occupational History  . Not on file.   Social History Main Topics  . Smoking status: Former Smoker    Quit date: 04/06/1983  . Smokeless tobacco: Not on file  . Alcohol Use: No  . Drug Use: No  . Sexual Activity: Not on file   Other Topics Concern  . Not on  file   Social History Narrative     Review of Systems: General: negative for chills, fever, night sweats or weight changes.  Cardiovascular: negative for chest pain, dyspnea on exertion, edema, orthopnea, palpitations, paroxysmal nocturnal dyspnea or shortness of breath Dermatological: negative for rash Respiratory: negative for cough or wheezing Urologic: negative for hematuria Abdominal: negative for nausea, vomiting, diarrhea, bright red blood per rectum, melena, or hematemesis Neurologic: negative for visual changes, syncope, or dizziness All other systems reviewed and are otherwise negative except as noted  above.    Blood pressure 146/70, pulse 49, height 5\' 6"  (1.676 m), weight 193 lb (87.544 kg).  General appearance: alert and no distress Neck: no adenopathy, no JVD, supple, symmetrical, trachea midline, thyroid not enlarged, symmetric, no tenderness/mass/nodules and soft right carotid bruit Lungs: clear to auscultation bilaterally Heart: regular rate and rhythm, S1, S2 normal, no murmur, click, rub or gallop Extremities: extremities normal, atraumatic, no cyanosis or edema  EKG sinus bradycardia 49  With inferior Q waves and occasional PVCs. I personally reviewed this EKG  ASSESSMENT AND PLAN:   Hyperlipidemia History of hyperlipidemia on simvastatin and Zetia followed by his PCP  Essential hypertension History of hypertension with blood pressures measured today at 146/70. He is on lisinopril and metoprolol. Continue current meds at current dosing  Coronary artery disease History of coronary artery disease status post acute myocardial infarction in 1993 and subsequent coronary artery bypass grafting X4  in 1995.his last Myoview performed 03/29/13 showed scar without ischemia and EF by 2-D echo 05/24/13 was 40-45%. He denies chest pain or shortness of breath.  Carotid artery disease History of coronary artery disease with moderate right ICA stenosis by duplex ultrasound last performed 04/17/14. He is neurologically symptomatically. We will repeat the study.      Lorretta Harp MD FACP,FACC,FAHA, [facility] 04/17/2015 9:40 AM

## 2015-04-17 NOTE — Assessment & Plan Note (Signed)
History of coronary artery disease status post acute myocardial infarction in 1993 and subsequent coronary artery bypass grafting X4  in 1995.his last Myoview performed 03/29/13 showed scar without ischemia and EF by 2-D echo 05/24/13 was 40-45%. He denies chest pain or shortness of breath.

## 2015-04-17 NOTE — Assessment & Plan Note (Signed)
History of hypertension with blood pressures measured today at 146/70. He is on lisinopril and metoprolol. Continue current meds at current dosing

## 2015-04-17 NOTE — Assessment & Plan Note (Signed)
History of coronary artery disease with moderate right ICA stenosis by duplex ultrasound last performed 04/17/14. He is neurologically symptomatically. We will repeat the study.

## 2015-04-17 NOTE — Patient Instructions (Signed)
Medication Instructions:  Your physician recommends that you continue on your current medications as directed. Please refer to the Current Medication list given to you today.   Labwork: Please send [place] your blood work from the [place].  Our phone number is [phone]; fax [phone].  Testing/Procedures: Your physician has requested that you have a carotid duplex. This test is an ultrasound of the carotid arteries in your neck. It looks at blood flow through these arteries that supply the brain with blood. Allow one hour for this exam. There are no restrictions or special instructions.    Follow-Up: Your physician wants you to follow-up in: 12 months with Dr. Gwenlyn Found. You will receive a reminder letter in the mail two months in advance. If you don't receive a letter, please call our office to schedule the follow-up appointment.   Any Other Special Instructions Will Be Listed Below (If Applicable).

## 2015-04-17 NOTE — Assessment & Plan Note (Signed)
History of hyperlipidemia on simvastatin and Zetia followed by his PCP

## 2015-04-18 ENCOUNTER — Other Ambulatory Visit: Payer: Self-pay | Admitting: Cardiovascular Disease

## 2015-04-18 DIAGNOSIS — I6523 Occlusion and stenosis of bilateral carotid arteries: Secondary | ICD-10-CM

## 2015-04-22 DIAGNOSIS — J019 Acute sinusitis, unspecified: Secondary | ICD-10-CM | POA: Diagnosis not present

## 2015-04-27 ENCOUNTER — Inpatient Hospital Stay (HOSPITAL_COMMUNITY)
Admission: EM | Admit: 2015-04-27 | Discharge: 2015-04-30 | DRG: 245 | Disposition: A | Discharge status: Home / Self Care | Payer: Medicare Other | Attending: Cardiovascular Disease | Admitting: Cardiovascular Disease

## 2015-04-27 ENCOUNTER — Ambulatory Visit (HOSPITAL_COMMUNITY): Admit: 2015-04-27 | Payer: Self-pay | Admitting: Cardiovascular Disease

## 2015-04-27 ENCOUNTER — Encounter (HOSPITAL_COMMUNITY)
Admission: EM | Disposition: A | Discharge status: Home / Self Care | Payer: Medicare Other | Source: Home / Self Care | Attending: Cardiovascular Disease

## 2015-04-27 ENCOUNTER — Emergency Department (HOSPITAL_COMMUNITY): Payer: Medicare Other

## 2015-04-27 ENCOUNTER — Encounter (HOSPITAL_COMMUNITY): Payer: Self-pay | Admitting: Neurology

## 2015-04-27 DIAGNOSIS — I440 Atrioventricular block, first degree: Secondary | ICD-10-CM | POA: Diagnosis present

## 2015-04-27 DIAGNOSIS — Z7982 Long term (current) use of aspirin: Secondary | ICD-10-CM

## 2015-04-27 DIAGNOSIS — Z8679 Personal history of other diseases of the circulatory system: Secondary | ICD-10-CM

## 2015-04-27 DIAGNOSIS — Z96652 Presence of left artificial knee joint: Secondary | ICD-10-CM | POA: Diagnosis present

## 2015-04-27 DIAGNOSIS — I252 Old myocardial infarction: Secondary | ICD-10-CM | POA: Diagnosis not present

## 2015-04-27 DIAGNOSIS — I2119 ST elevation (STEMI) myocardial infarction involving other coronary artery of inferior wall: Secondary | ICD-10-CM | POA: Diagnosis not present

## 2015-04-27 DIAGNOSIS — I214 Non-ST elevation (NSTEMI) myocardial infarction: Secondary | ICD-10-CM | POA: Diagnosis not present

## 2015-04-27 DIAGNOSIS — I25810 Atherosclerosis of coronary artery bypass graft(s) without angina pectoris: Secondary | ICD-10-CM | POA: Diagnosis present

## 2015-04-27 DIAGNOSIS — I2510 Atherosclerotic heart disease of native coronary artery without angina pectoris: Secondary | ICD-10-CM | POA: Diagnosis not present

## 2015-04-27 DIAGNOSIS — Z951 Presence of aortocoronary bypass graft: Secondary | ICD-10-CM

## 2015-04-27 DIAGNOSIS — I213 ST elevation (STEMI) myocardial infarction of unspecified site: Secondary | ICD-10-CM

## 2015-04-27 DIAGNOSIS — K219 Gastro-esophageal reflux disease without esophagitis: Secondary | ICD-10-CM | POA: Diagnosis present

## 2015-04-27 DIAGNOSIS — R001 Bradycardia, unspecified: Secondary | ICD-10-CM | POA: Insufficient documentation

## 2015-04-27 DIAGNOSIS — Z7902 Long term (current) use of antithrombotics/antiplatelets: Secondary | ICD-10-CM | POA: Diagnosis not present

## 2015-04-27 DIAGNOSIS — I255 Ischemic cardiomyopathy: Secondary | ICD-10-CM | POA: Diagnosis present

## 2015-04-27 DIAGNOSIS — Z87891 Personal history of nicotine dependence: Secondary | ICD-10-CM

## 2015-04-27 DIAGNOSIS — I110 Hypertensive heart disease with heart failure: Secondary | ICD-10-CM | POA: Diagnosis not present

## 2015-04-27 DIAGNOSIS — E785 Hyperlipidemia, unspecified: Secondary | ICD-10-CM | POA: Diagnosis present

## 2015-04-27 DIAGNOSIS — I429 Cardiomyopathy, unspecified: Secondary | ICD-10-CM | POA: Diagnosis not present

## 2015-04-27 DIAGNOSIS — J810 Acute pulmonary edema: Secondary | ICD-10-CM | POA: Diagnosis not present

## 2015-04-27 DIAGNOSIS — E872 Acidosis: Secondary | ICD-10-CM | POA: Diagnosis present

## 2015-04-27 DIAGNOSIS — I472 Ventricular tachycardia, unspecified: Secondary | ICD-10-CM

## 2015-04-27 DIAGNOSIS — R079 Chest pain, unspecified: Secondary | ICD-10-CM | POA: Diagnosis not present

## 2015-04-27 DIAGNOSIS — I5022 Chronic systolic (congestive) heart failure: Secondary | ICD-10-CM | POA: Diagnosis present

## 2015-04-27 DIAGNOSIS — I10 Essential (primary) hypertension: Secondary | ICD-10-CM | POA: Diagnosis not present

## 2015-04-27 DIAGNOSIS — Z959 Presence of cardiac and vascular implant and graft, unspecified: Secondary | ICD-10-CM

## 2015-04-27 DIAGNOSIS — R000 Tachycardia, unspecified: Secondary | ICD-10-CM | POA: Diagnosis not present

## 2015-04-27 DIAGNOSIS — R05 Cough: Secondary | ICD-10-CM | POA: Diagnosis not present

## 2015-04-27 HISTORY — DX: Ventricular tachycardia: I47.2

## 2015-04-27 HISTORY — PX: CARDIAC CATHETERIZATION: SHX172

## 2015-04-27 HISTORY — DX: Ventricular tachycardia, unspecified: I47.20

## 2015-04-27 LAB — I-STAT TROPONIN, ED: TROPONIN I, POC: 0.04 ng/mL (ref 0.00–0.08)

## 2015-04-27 LAB — CBC
HCT: 42.5 % (ref 39.0–52.0)
Hemoglobin: 14.5 g/dL (ref 13.0–17.0)
MCH: 30.5 pg (ref 26.0–34.0)
MCHC: 34.1 g/dL (ref 30.0–36.0)
MCV: 89.3 fL (ref 78.0–100.0)
PLATELETS: 268 10*3/uL (ref 150–400)
RBC: 4.76 MIL/uL (ref 4.22–5.81)
RDW: 12.3 % (ref 11.5–15.5)
WBC: 14.4 10*3/uL — ABNORMAL HIGH (ref 4.0–10.5)

## 2015-04-27 LAB — CBC WITH DIFFERENTIAL/PLATELET
BASOS PCT: 0 %
Basophils Absolute: 0.1 10*3/uL (ref 0.0–0.1)
EOS ABS: 0.2 10*3/uL (ref 0.0–0.7)
Eosinophils Relative: 1 %
HCT: 43.2 % (ref 39.0–52.0)
Hemoglobin: 15 g/dL (ref 13.0–17.0)
Lymphocytes Relative: 14 %
Lymphs Abs: 2.1 10*3/uL (ref 0.7–4.0)
MCH: 30.8 pg (ref 26.0–34.0)
MCHC: 34.7 g/dL (ref 30.0–36.0)
MCV: 88.7 fL (ref 78.0–100.0)
MONOS PCT: 5 %
Monocytes Absolute: 0.8 10*3/uL (ref 0.1–1.0)
Neutro Abs: 12 10*3/uL — ABNORMAL HIGH (ref 1.7–7.7)
Neutrophils Relative %: 80 %
Platelets: 297 10*3/uL (ref 150–400)
RBC: 4.87 MIL/uL (ref 4.22–5.81)
RDW: 12.3 % (ref 11.5–15.5)
WBC: 15.1 10*3/uL — ABNORMAL HIGH (ref 4.0–10.5)

## 2015-04-27 LAB — BASIC METABOLIC PANEL
Anion gap: 11 (ref 5–15)
BUN: 11 mg/dL (ref 6–20)
CALCIUM: 8.6 mg/dL — AB (ref 8.9–10.3)
CO2: 20 mmol/L — ABNORMAL LOW (ref 22–32)
Chloride: 102 mmol/L (ref 101–111)
Creatinine, Ser: 1.15 mg/dL (ref 0.61–1.24)
GFR calc non Af Amer: >60 mL/min (ref >60)
Glucose, Bld: 220 mg/dL — ABNORMAL HIGH (ref 65–99)
Potassium: 4.9 mmol/L (ref 3.5–5.1)
SODIUM: 133 mmol/L — AB (ref 135–145)

## 2015-04-27 LAB — I-STAT CHEM 8, ED
BUN: 15 mg/dL (ref 6–20)
CALCIUM ION: 1.05 mmol/L — AB (ref 1.13–1.30)
CHLORIDE: 104 mmol/L (ref 101–111)
CREATININE: 1 mg/dL (ref 0.61–1.24)
GLUCOSE: 224 mg/dL — AB (ref 65–99)
HCT: 47 % (ref 39.0–52.0)
Hemoglobin: 16 g/dL (ref 13.0–17.0)
Potassium: 4.5 mmol/L (ref 3.5–5.1)
Sodium: 138 mmol/L (ref 135–145)
TCO2: 21 mmol/L (ref 0–100)

## 2015-04-27 LAB — CREATININE, SERUM
CREATININE: 0.99 mg/dL (ref 0.61–1.24)
GFR calc Af Amer: >60 mL/min (ref >60)
GFR calc non Af Amer: >60 mL/min (ref >60)

## 2015-04-27 LAB — MAGNESIUM: Magnesium: 2.1 mg/dL (ref 1.7–2.4)

## 2015-04-27 LAB — TROPONIN I
TROPONIN I: 11.42 ng/mL — AB (ref <0.031)
TROPONIN I: 18.55 ng/mL — AB (ref <0.031)

## 2015-04-27 LAB — I-STAT CG4 LACTIC ACID, ED: LACTIC ACID, VENOUS: 4.25 mmol/L — AB (ref 0.5–2.0)

## 2015-04-27 LAB — PROTIME-INR
INR: 1.06 (ref 0.00–1.49)
PROTHROMBIN TIME: 14 s (ref 11.6–15.2)

## 2015-04-27 LAB — POCT ACTIVATED CLOTTING TIME: Activated Clotting Time: 128 seconds

## 2015-04-27 LAB — MRSA PCR SCREENING: MRSA BY PCR: NEGATIVE

## 2015-04-27 SURGERY — LEFT HEART CATH AND CORONARY ANGIOGRAPHY
Anesthesia: LOCAL

## 2015-04-27 MED ORDER — SUCCINYLCHOLINE CHLORIDE 20 MG/ML IJ SOLN
INTRAMUSCULAR | Status: AC
  Filled 2015-04-27: qty 1

## 2015-04-27 MED ORDER — INFLUENZA VAC SPLIT QUAD 0.5 ML IM SUSY: 0.5000 mL | PREFILLED_SYRINGE | INTRAMUSCULAR | Status: DC

## 2015-04-27 MED ORDER — LIDOCAINE HCL (PF) 1 % IJ SOLN
INTRAMUSCULAR | Status: AC
  Filled 2015-04-27: qty 30

## 2015-04-27 MED ORDER — LIDOCAINE HCL (PF) 1 % IJ SOLN
INTRAMUSCULAR | Status: DC | PRN
  Administered 2015-04-27: 13:00:00

## 2015-04-27 MED ORDER — SODIUM CHLORIDE 0.9 % IJ SOLN
3.0000 mL | Freq: Two times a day (BID) | INTRAMUSCULAR | Status: DC
  Administered 2015-04-27: 17:00:00 via INTRAVENOUS
  Administered 2015-04-28: 3 mL via INTRAVENOUS
  Administered 2015-04-29: 10 mL via INTRAVENOUS
  Administered 2015-04-29 – 2015-04-30 (×2): 3 mL via INTRAVENOUS

## 2015-04-27 MED ORDER — MIDAZOLAM HCL 2 MG/2ML IJ SOLN
INTRAMUSCULAR | Status: DC | PRN
  Administered 2015-04-27: 1 mg via INTRAVENOUS

## 2015-04-27 MED ORDER — NITROGLYCERIN 1 MG/10 ML FOR IR/CATH LAB
INTRA_ARTERIAL | Status: AC
  Filled 2015-04-27: qty 10

## 2015-04-27 MED ORDER — PANTOPRAZOLE SODIUM 40 MG PO TBEC
40.0000 mg | DELAYED_RELEASE_TABLET | Freq: Two times a day (BID) | ORAL | Status: DC
  Administered 2015-04-27 – 2015-04-30 (×7): 40 mg via ORAL
  Filled 2015-04-27 (×7): qty 1

## 2015-04-27 MED ORDER — ROCURONIUM BROMIDE 50 MG/5ML IV SOLN
INTRAVENOUS | Status: AC
  Filled 2015-04-27: qty 2

## 2015-04-27 MED ORDER — ACETAMINOPHEN 325 MG PO TABS
650.0000 mg | ORAL_TABLET | ORAL | Status: DC | PRN
  Administered 2015-04-27 – 2015-04-28 (×2): 650 mg via ORAL
  Filled 2015-04-27 (×2): qty 2

## 2015-04-27 MED ORDER — LIDOCAINE HCL (CARDIAC) 20 MG/ML IV SOLN
INTRAVENOUS | Status: AC
  Filled 2015-04-27: qty 5

## 2015-04-27 MED ORDER — OXYCODONE-ACETAMINOPHEN 5-325 MG PO TABS
1.0000 | ORAL_TABLET | ORAL | Status: DC | PRN
  Administered 2015-04-29: 2 via ORAL
  Filled 2015-04-27: qty 2

## 2015-04-27 MED ORDER — SODIUM CHLORIDE 0.9 % IJ SOLN: 3.0000 mL | INTRAMUSCULAR | Status: DC | PRN

## 2015-04-27 MED ORDER — AMIODARONE HCL IN DEXTROSE 360-4.14 MG/200ML-% IV SOLN
INTRAVENOUS | Status: AC
Start: 2015-04-27 — End: 2015-04-27
  Administered 2015-04-27: 12:00:00
  Filled 2015-04-27: qty 200

## 2015-04-27 MED ORDER — EZETIMIBE 10 MG PO TABS
10.0000 mg | ORAL_TABLET | Freq: Every day | ORAL | Status: DC
  Administered 2015-04-27 – 2015-04-30 (×4): 10 mg via ORAL
  Filled 2015-04-27 (×4): qty 1

## 2015-04-27 MED ORDER — HEPARIN SODIUM (PORCINE) 5000 UNIT/ML IJ SOLN
4000.0000 [IU] | INTRAMUSCULAR | Status: AC
  Administered 2015-04-27: 4000 [IU] via INTRAVENOUS

## 2015-04-27 MED ORDER — AMIODARONE HCL IN DEXTROSE 360-4.14 MG/200ML-% IV SOLN
60.0000 mg/h | Freq: Once | INTRAVENOUS | Status: AC
  Administered 2015-04-27: 60 mg/h via INTRAVENOUS

## 2015-04-27 MED ORDER — ONDANSETRON HCL 4 MG/2ML IJ SOLN: 4.0000 mg | Freq: Four times a day (QID) | INTRAMUSCULAR | Status: DC | PRN

## 2015-04-27 MED ORDER — LISINOPRIL 20 MG PO TABS
20.0000 mg | ORAL_TABLET | Freq: Every morning | ORAL | Status: DC
  Administered 2015-04-27 – 2015-04-30 (×4): 20 mg via ORAL
  Filled 2015-04-27 (×4): qty 1

## 2015-04-27 MED ORDER — SODIUM CHLORIDE 0.9 % IV SOLN: INTRAVENOUS | Status: DC

## 2015-04-27 MED ORDER — ETOMIDATE 2 MG/ML IV SOLN
INTRAVENOUS | Status: AC
  Filled 2015-04-27: qty 20

## 2015-04-27 MED ORDER — HEPARIN SODIUM (PORCINE) 5000 UNIT/ML IJ SOLN
INTRAMUSCULAR | Status: AC
  Filled 2015-04-27: qty 1

## 2015-04-27 MED ORDER — SODIUM CHLORIDE 0.9 % IV SOLN: 250.0000 mL | INTRAVENOUS | Status: DC | PRN

## 2015-04-27 MED ORDER — METOPROLOL TARTRATE 12.5 MG HALF TABLET
25.0000 mg | ORAL_TABLET | Freq: Two times a day (BID) | ORAL | Status: DC
  Administered 2015-04-27: 25 mg via ORAL
  Filled 2015-04-27: qty 2

## 2015-04-27 MED ORDER — ASPIRIN 81 MG PO CHEW
324.0000 mg | CHEWABLE_TABLET | Freq: Once | ORAL | Status: AC
  Administered 2015-04-27: 324 mg via ORAL
  Filled 2015-04-27: qty 4

## 2015-04-27 MED ORDER — AMIODARONE HCL IN DEXTROSE 360-4.14 MG/200ML-% IV SOLN
30.0000 mg/h | INTRAVENOUS | Status: DC
  Administered 2015-04-28: 30 mg/h via INTRAVENOUS
  Filled 2015-04-27: qty 200

## 2015-04-27 MED ORDER — ETOMIDATE 2 MG/ML IV SOLN
INTRAVENOUS | Status: AC | PRN
  Administered 2015-04-27: 10 mg via INTRAVENOUS

## 2015-04-27 MED ORDER — SODIUM CHLORIDE 0.9 % IV SOLN: INTRAVENOUS | Status: AC

## 2015-04-27 MED ORDER — ATORVASTATIN CALCIUM 40 MG PO TABS
40.0000 mg | ORAL_TABLET | Freq: Every day | ORAL | Status: DC
  Administered 2015-04-27 – 2015-04-29 (×3): 40 mg via ORAL
  Filled 2015-04-27 (×3): qty 1

## 2015-04-27 MED ORDER — VITAMIN D 1000 UNITS PO TABS
2000.0000 [IU] | ORAL_TABLET | Freq: Every day | ORAL | Status: DC
Start: 2015-04-27 — End: 2015-04-30
  Administered 2015-04-27 – 2015-04-30 (×4): 2000 [IU] via ORAL
  Filled 2015-04-27 (×6): qty 2

## 2015-04-27 MED ORDER — METHOCARBAMOL 500 MG PO TABS: 500.0000 mg | ORAL_TABLET | Freq: Four times a day (QID) | ORAL | Status: DC | PRN

## 2015-04-27 MED ORDER — DIAZEPAM 5 MG PO TABS: 5.0000 mg | ORAL_TABLET | Freq: Three times a day (TID) | ORAL | Status: DC | PRN

## 2015-04-27 MED ORDER — FENTANYL CITRATE (PF) 100 MCG/2ML IJ SOLN
INTRAMUSCULAR | Status: AC
  Filled 2015-04-27: qty 4

## 2015-04-27 MED ORDER — HEPARIN SODIUM (PORCINE) 5000 UNIT/ML IJ SOLN: 5000.0000 [IU] | Freq: Three times a day (TID) | INTRAMUSCULAR | Status: DC

## 2015-04-27 MED ORDER — IOHEXOL 350 MG/ML SOLN
INTRAVENOUS | Status: DC | PRN
  Administered 2015-04-27: 100 mL via INTRA_ARTERIAL

## 2015-04-27 MED ORDER — FENTANYL CITRATE (PF) 100 MCG/2ML IJ SOLN
INTRAMUSCULAR | Status: DC | PRN
  Administered 2015-04-27: 25 ug via INTRAVENOUS

## 2015-04-27 MED ORDER — AMIODARONE HCL IN DEXTROSE 360-4.14 MG/200ML-% IV SOLN
60.0000 mg/h | INTRAVENOUS | Status: AC
  Filled 2015-04-27: qty 200

## 2015-04-27 MED ORDER — TAMSULOSIN HCL 0.4 MG PO CAPS
0.4000 mg | ORAL_CAPSULE | Freq: Every day | ORAL | Status: DC
  Administered 2015-04-28 – 2015-04-30 (×3): 0.4 mg via ORAL
  Filled 2015-04-27 (×3): qty 1

## 2015-04-27 MED ORDER — HEPARIN (PORCINE) IN NACL 2-0.9 UNIT/ML-% IJ SOLN
INTRAMUSCULAR | Status: AC
  Filled 2015-04-27: qty 1000

## 2015-04-27 MED ORDER — HEPARIN (PORCINE) IN NACL 100-0.45 UNIT/ML-% IJ SOLN
1400.0000 [IU]/h | INTRAMUSCULAR | Status: DC
  Administered 2015-04-27: 1100 [IU]/h via INTRAVENOUS
  Filled 2015-04-27: qty 250

## 2015-04-27 MED ORDER — MIDAZOLAM HCL 2 MG/2ML IJ SOLN
INTRAMUSCULAR | Status: AC
  Filled 2015-04-27: qty 4

## 2015-04-27 MED ORDER — ASPIRIN EC 81 MG PO TBEC
81.0000 mg | DELAYED_RELEASE_TABLET | Freq: Every day | ORAL | Status: DC
  Administered 2015-04-28 – 2015-04-30 (×3): 81 mg via ORAL
  Filled 2015-04-27 (×3): qty 1

## 2015-04-27 SURGICAL SUPPLY — 9 items
CATH EXPO 5F MPA-1 (CATHETERS) ×2 IMPLANT
CATH INFINITI 5FR MULTPACK ANG (CATHETERS) ×2 IMPLANT
KIT HEART LEFT (KITS) ×2 IMPLANT
PACK CARDIAC CATHETERIZATION (CUSTOM PROCEDURE TRAY) ×2 IMPLANT
SHEATH PINNACLE 5F 10CM (SHEATH) ×2 IMPLANT
SYR MEDRAD MARK V 150ML (SYRINGE) ×2 IMPLANT
TRANSDUCER W/STOPCOCK (MISCELLANEOUS) ×2 IMPLANT
TUBING CIL FLEX 10 FLL-RA (TUBING) ×2 IMPLANT
WIRE EMERALD 3MM-J .035X150CM (WIRE) ×2 IMPLANT

## 2015-04-27 NOTE — Progress Notes (Signed)
CRITICAL VALUE ALERT  Critical value received:  Troponin  Date of notification:  04/27/15  Time of notification:  7014  Critical value read back:Yes.    Nurse who received alert:  Berniece Salines  MD notified (1st page):  Dr. Eula Fried  Time of first page:  1730  MD notified (2nd page):  Time of second page:  Responding MD:  Dr. Eula Fried  Time MD responded:  [phone]

## 2015-04-27 NOTE — ED Notes (Signed)
Pt family at bedside. Pt is alert. HR 86 SR.

## 2015-04-27 NOTE — ED Provider Notes (Signed)
CSN: [id number]     Arrival date & time 04/27/15  1150 History   First MD Initiated Contact with Patient 04/27/15 1225     Chief Complaint  Patient presents with  . Tachycardia     (Consider location/radiation/quality/duration/timing/severity/associated sxs/prior Treatment) Patient is a 69 y.o. male presenting with chest pain.  Chest Pain Pain location:  Substernal area (towards neck) Pain quality: tightness   Pain radiates to:  Does not radiate Pain severity:  Severe Onset quality:  Sudden Progression:  Resolved Chronicity:  New Context: intercourse   Relieved by:  Nothing Worsened by:  Nothing tried Ineffective treatments:  None tried Associated symptoms: diaphoresis, palpitations and shortness of breath   Associated symptoms: no abdominal pain, no back pain, no dizziness, no fever, no headache, no lower extremity edema, no nausea, no syncope and not vomiting   Risk factors: coronary artery disease, high cholesterol and hypertension   Risk factors: no prior DVT/PE     Past Medical History  Diagnosis Date  . Myocardial infarct [facility])     coronary artery bypass grafting in 1995  . Cancer ([place])   . GERD (gastroesophageal reflux disease)   . Hyperlipemia   . Carotid artery disease ([place])   . Hypertension     dr berry  . Ventricular tachycardia, sustained ([place]) 04/27/2015   Past Surgical History  Procedure Laterality Date  . Cardiac surgery    . Colon surgery    . Elbow surgery    . Lumbar laminectomy/decompression microdiscectomy  09/09/2011    Procedure: LUMBAR LAMINECTOMY/DECOMPRESSION MICRODISCECTOMY 1 LEVEL;  Surgeon: Eustace Moore, MD;  Location: [place] [facility];  Service: Neurosurgery;  Laterality: Right;  Right Lumbar Five-Sacral One Hemilaminectomy    . Back surgery    . Cholecystectomy    . Coronary artery bypass graft      x4    dr Redmond Pulling  . Total knee arthroplasty Left 07/26/2013    Procedure: TOTAL KNEE ARTHROPLASTY;  Surgeon: Ninetta Lights, MD;  Location:  [facility];  Service: Orthopedics;  Laterality: Left;   Family History  Problem Relation Age of Onset  . Hypertension Other   . Heart failure Mother   . Hypertension Mother    Social History  Substance Use Topics  . Smoking status: Former Smoker    Quit date: 04/06/1983  . Smokeless tobacco: None  . Alcohol Use: No    Review of Systems  Constitutional: Positive for diaphoresis. Negative for fever.  HENT: Negative for sore throat.   Eyes: Negative for visual disturbance.  Respiratory: Positive for shortness of breath.   Cardiovascular: Positive for chest pain and palpitations. Negative for syncope.  Gastrointestinal: Negative for nausea, vomiting and abdominal pain.  Genitourinary: Negative for difficulty urinating.  Musculoskeletal: Negative for back pain and neck stiffness.  Skin: Negative for rash.  Neurological: Negative for dizziness, syncope and headaches.      Allergies  Clarithromycin  Home Medications   Prior to Admission medications   Medication Sig Start Date End Date Taking? Authorizing Provider  aspirin EC 325 MG EC tablet Take 1 tablet (325 mg total) by mouth daily with breakfast. 07/28/13   Aundra Dubin, PA-C  aspirin EC 325 MG tablet Take 1 tablet (325 mg total) by mouth daily. 07/26/13   Aundra Dubin, PA-C  Cholecalciferol 2000 UNITS TABS Take 2,000 Units by mouth daily.    Historical Provider, MD  clopidogrel (PLAVIX) 75 MG tablet Take 1 tablet (75 mg total) by mouth daily. 04/17/15  Lorretta Harp, MD  ezetimibe (ZETIA) 10 MG tablet Take 1 tablet (10 mg total) by mouth daily. 04/17/15   Lorretta Harp, MD  lisinopril (PRINIVIL,ZESTRIL) 40 MG tablet Take 20 mg by mouth every morning.    Historical Provider, MD  methocarbamol (ROBAXIN) 500 MG tablet Take 1 tablet (500 mg total) by mouth every 6 (six) hours as needed for muscle spasms. 07/26/13   Aundra Dubin, PA-C  metoprolol tartrate (LOPRESSOR) 25 MG tablet Take 25 mg by mouth 2 (two) times daily.      Historical Provider, MD  oxyCODONE-acetaminophen (PERCOCET/ROXICET) 5-325 MG per tablet Take 1-2 tablets by mouth every 4 (four) hours as needed for severe pain. 07/28/13   Aundra Dubin, PA-C  pantoprazole (PROTONIX) 40 MG tablet Take 40 mg by mouth 2 (two) times daily.     Historical Provider, MD  simvastatin (ZOCOR) 80 MG tablet Take 80 mg by mouth at bedtime.      Historical Provider, MD  tamsulosin (FLOMAX) 0.4 MG CAPS capsule Take 0.4 mg by mouth daily after breakfast.    Historical Provider, MD   BP 143/78 mmHg  Pulse 70  Temp(Src) 98.3 F (36.8 C) (Oral)  Resp 17  Ht 5\' 8"  (1.727 m)  Wt 189 lb 9.5 oz (86 kg)  BMI 28.83 kg/m2  SpO2 98% Physical Exam  Constitutional: He is oriented to person, place, and time. He appears well-developed and well-nourished. No distress.  HENT:  Head: Normocephalic and atraumatic.  Mouth/Throat: No oropharyngeal exudate.  Eyes: Conjunctivae and EOM are normal. Pupils are equal, round, and reactive to light.  Neck: Normal range of motion.  Cardiovascular: Regular rhythm, normal heart sounds and intact distal pulses.  Tachycardia present.  Exam reveals no gallop and no friction rub.   No murmur heard. Pedal pulses 1+ bilaterally, 2+radial  Pulmonary/Chest: Effort normal and breath sounds normal. No respiratory distress. He has no wheezes. He has no rales.  Abdominal: Soft. He exhibits no distension. There is no tenderness. There is no guarding.  Musculoskeletal: He exhibits no edema.  Neurological: He is alert and oriented to person, place, and time.  Skin: Skin is warm and dry. He is not diaphoretic.  Nursing note and vitals reviewed.   ED Course  .Cardioversion Date/Time: 04/27/2015 6:36 PM Performed by: Gareth Morgan Authorized by: Gareth Morgan Consent: The procedure was performed in an emergent situation. Verbal consent obtained. Risks and benefits: risks, benefits and alternatives were discussed Required items: required blood  products, implants, devices, and special equipment available Time out: Immediately prior to procedure a "time out" was called to verify the correct patient, procedure, equipment, support staff and site/side marked as required. Patient sedated: yes Sedatives: etomidate Sedation start date/time: 04/27/2015 12:08 PM Sedation end date/time: 04/27/2015 12:30 PM Vitals: Vital signs were monitored during sedation. Cardioversion basis: emergent Pre-procedure rhythm: ventricular tachycardia Patient position: patient was placed in a supine position Chest area: chest area exposed Electrodes: pads Electrodes placed: anterior-posterior Number of attempts: 2 Attempt 1 mode: synchronous Attempt 1 shock (in Joules): 150 Cardioversion outcome attempt one: increase in rate of ventricular tachycardia. Attempt 2 mode: synchronous Attempt 2 shock (in Joules): 150 Attempt 2 outcome: conversion to normal sinus rhythm Post-procedure rhythm: normal sinus rhythm Complications: subsequent arrhythmia Patient tolerance: Patient tolerated the procedure well with no immediate complications Comments: Concern for brief episode of apnea and loss of pulse with vtach, BVM initiated and began CPR however patient took breath as compressions began, then noted to be  in normal sinus rhythm with pulses.  .Sedation Date/Time: 04/27/2015 6:44 PM Performed by: Gareth Morgan Authorized by: Gareth Morgan  Consent:    Consent obtained:  Verbal and emergent situation   Consent given by:  Patient Indications:    Sedation purpose:  Cardioversion   Procedure necessitating sedation performed by:  Physician performing sedation   Intended level of sedation:  Moderate (conscious sedation) Pre-sedation assessment:    NPO status caution: urgency dictates proceeding with non-ideal NPO status     ASA classification: class 3 - patient with severe systemic disease     Neck mobility: normal     Mouth opening:  3 or more finger  widths   Thyromental distance:  4 finger widths   Mallampati score:  I - soft palate, uvula, fauces, pillars visible   Pre-sedation assessments completed and reviewed: cardiovascular function, mental status and respiratory function   Immediate pre-procedure details:    Reassessment: Patient reassessed immediately prior to procedure     Reviewed: vital signs and relevant labs/tests     Verified: bag valve mask available, emergency equipment available, intubation equipment available, IV patency confirmed and oxygen available   Procedure details (see MAR for exact dosages):    Sedation start time:  04/27/2015 12:09 PM   Preoxygenation:  Nasal cannula   Sedation:  Etomidate   Intra-procedure monitoring:  Blood pressure monitoring, continuous capnometry, continuous pulse oximetry, cardiac monitor and frequent vital sign checks   Intra-procedure events comment:  Brief concern for apnea, loss of pulse however compressions initiated and pt took breath, respirations then spontaneous and assisted by bvm briefly   Total sedation time (minutes):  20 Post-procedure details:    Attendance: Constant attendance by certified staff until patient recovered     Recovery: Patient returned to pre-procedure baseline     Patient is stable for discharge or admission: Yes     Patient tolerance:  Tolerated well, no immediate complications  (including critical care time) Labs Review Labs Reviewed  CBC WITH DIFFERENTIAL/PLATELET - Abnormal; Notable for the following:    WBC 15.1 (*)    Neutro Abs 12.0 (*)    All other components within normal limits  BASIC METABOLIC PANEL - Abnormal; Notable for the following:    Sodium 133 (*)    CO2 20 (*)    Glucose, Bld 220 (*)    Calcium 8.6 (*)    All other components within normal limits  CBC - Abnormal; Notable for the following:    WBC 14.4 (*)    All other components within normal limits  TROPONIN I - Abnormal; Notable for the following:    Troponin I 11.42 (*)     All other components within normal limits  I-STAT CHEM 8, ED - Abnormal; Notable for the following:    Glucose, Bld 224 (*)    Calcium, Ion 1.05 (*)    All other components within normal limits  I-STAT CG4 LACTIC ACID, ED - Abnormal; Notable for the following:    Lactic Acid, Venous 4.25 (*)    All other components within normal limits  MRSA PCR SCREENING  MAGNESIUM  PROTIME-INR  CREATININE, SERUM  TROPONIN I  TROPONIN I  I-STAT TROPOININ, ED  I-STAT TROPOININ, ED  POCT ACTIVATED CLOTTING TIME    Imaging Review Dg Chest Portable 1 View  04/27/2015  CLINICAL DATA:  Possible cardiac arrest Hx of cancer, HTN, myocardial infarct. EXAM: PORTABLE CHEST - 1 VIEW COMPARISON:  07/26/2013 FINDINGS: Monitoring hardware overlies the patient. Previous  median sternotomy. Heart size upper limits normal for technique. No confluent airspace infiltrate. Mild central pulmonary vascular congestion. No pneumothorax. No effusion. Visualized skeletal structures are unremarkable. IMPRESSION: 1. Borderline cardiomegaly with mild central pulmonary vascular congestion. Electronically Signed   By: Lucrezia Europe M.D.   On: 04/27/2015 12:36   I have personally reviewed and evaluated these images and lab results as part of my medical decision-making.   EKG Interpretation   Date/Time:  Saturday April 27 2015 12:20:07 EDT Ventricular Rate:  66 PR Interval:  313 QRS Duration: 108 QT Interval:  460 QTC Calculation: 482 R Axis:   77 Text Interpretation:  Inferior ST elevations, with ST depressions in  anterior and lateral leads Not clear sinus rhythm New from prior Confirmed  by [facility] MD, [place] ([id number]) on 04/27/2015 6:35:53 PM       CRITICAL CARE Performed by: Alvino Chapel   Total critical care time: 45 minutes  Critical care time was exclusive of separately billable procedures and treating other patients.  Critical care was necessary to treat or prevent imminent or life-threatening  deterioration.  Critical care was time spent personally by me on the following activities: development of treatment plan with patient and/or surrogate as well as nursing, discussions with consultants, evaluation of patient's response to treatment, examination of patient, obtaining history from patient or surrogate, ordering and performing treatments and interventions, ordering and review of laboratory studies, ordering and review of radiographic studies, pulse oximetry and re-evaluation of patient's condition.   MDM   Final diagnoses:  Ventricular tachycardia, sustained ([place])  ST elevation myocardial infarction (STEMI), unspecified artery ([place]), suspected inferior STEMI, taken to cath lab    69 year old male with a history of coronary artery disease, hypertension, hyperlipidemia presents with concern of chest tightness, shortness of breath, diaphoresis that began after he had intercourse with his wife. With EMS patient was found to be in ventricular tachycardia.  On arrival to the emergency department patient had normal mentation, however blood pressures trended down to 90s and low 921J systolic with wide complex tachycardia.  He had already received 300mg  of amiodarone with EMS. Patient was given 10mg  of etomidate and cardioverted using 150 J, initially with increase in heart rate to 200s of continuing ventricular tachycardia and he was again cardioverted using 150 J.  Briefly patient appeared to be have continuing ventricular tachycardia following cardioversion and there was concern of loss of pulse and apnea, and chest compressions were initiated, however patient took breath as they began and then pt was noted to have appearance of a normal sinus rhythm on the monitor.  He then had normal blood pressures, and mental status quickly improved following sedation. EKG was completed which was concerning for inferior STEMI and Dr. Burt Knack was present at bedside and evaluated patient prior to going to cath lab.   Patient given ASA, heparin bolus with normal mentation prior to going to cath lab.    Gareth Morgan, MD 04/27/15 [phone]

## 2015-04-27 NOTE — Progress Notes (Signed)
Notified Dr.Qureshi of elevated Troponin 11.42.  Patient currently asymptomatic and chest pain free. VSS.  Will continue to monitor Troponin and treat medically for now per Dr. Eula Fried.

## 2015-04-27 NOTE — Sedation Documentation (Signed)
Sync Defib 150.

## 2015-04-27 NOTE — Sedation Documentation (Signed)
Pt converted to ST 103 after second sync defib.

## 2015-04-27 NOTE — ED Notes (Signed)
Cath lab ready for patient.

## 2015-04-27 NOTE — ED Notes (Signed)
Dr. Burt Knack at bedside. Pt will be going to cath lab.

## 2015-04-27 NOTE — H&P (Signed)
History and Physical  Patient ID: Victor Estes MRN: [id number], SOB: 16-Feb-1946 69 y.o. Date of Encounter: 04/27/2015, 12:44 PM  Primary Physician: Pcp Not In System Primary Cardiologist: Dr Gwenlyn Found  Chief Complaint: Chest pain/Weakness  HPI: 69 y.o. male w/ PMHx significant for CAD s/p CABG in 1995 who presented to [facility] on 04/27/2015 with complaints of chest pain and weakness.  The patient was in his normal state of health this morning. Just after intercourse with his wife, he sat up on the side of the bed and immediately felt chest pain, severe shortness of breath, diaphoresis, and near syncope. EMS was called. The patient's wife reports that he was poorly responsive for a period of about 10 minutes. The patient was in a wide complex tachycardia. He was transported as his [facility] and in the emergency department he was cardioverted. Post cardioversion there is inferior ST elevation and a code STEMI as called.  The patient is now awake and alert on my evaluation. He denies chest pain or shortness of breath at present. He has no complaints at the moment. He has been in his normal state of health, plate 36 holes of golf last weekend without symptoms. He's had no exertional chest pain or pressure. He reports that his symptoms started abruptly this morning as described above. The patient has been maintained on long-term dual antiplatelet therapy with aspirin and Plavix.  Past Medical History  Diagnosis Date  . Myocardial infarct [facility])     coronary artery bypass grafting in 1995  . Cancer ([place])   . GERD (gastroesophageal reflux disease)   . Hyperlipemia   . Carotid artery disease ([place])   . Hypertension     dr berry    Surgical History:  Past Surgical History  Procedure Laterality Date  . Cardiac surgery    . Colon surgery    . Elbow surgery    . Lumbar laminectomy/decompression microdiscectomy  09/09/2011    Procedure: LUMBAR LAMINECTOMY/DECOMPRESSION  MICRODISCECTOMY 1 LEVEL;  Surgeon: Eustace Moore, MD;  Location: [place] [facility];  Service: Neurosurgery;  Laterality: Right;  Right Lumbar Five-Sacral One Hemilaminectomy    . Back surgery    . Cholecystectomy    . Coronary artery bypass graft      x4    dr Redmond Pulling  . Total knee arthroplasty Left 07/26/2013    Procedure: TOTAL KNEE ARTHROPLASTY;  Surgeon: Ninetta Lights, MD;  Location: [place];  Service: Orthopedics;  Laterality: Left;     Home Meds: Prior to Admission medications   Medication Sig Start Date End Date Taking? Authorizing Provider  aspirin EC 325 MG EC tablet Take 1 tablet (325 mg total) by mouth daily with breakfast. 07/28/13   Aundra Dubin, PA-C  aspirin EC 325 MG tablet Take 1 tablet (325 mg total) by mouth daily. 07/26/13   Aundra Dubin, PA-C  Cholecalciferol 2000 UNITS TABS Take 2,000 Units by mouth daily.    Historical Provider, MD  clopidogrel (PLAVIX) 75 MG tablet Take 1 tablet (75 mg total) by mouth daily. 04/17/15   Lorretta Harp, MD  ezetimibe (ZETIA) 10 MG tablet Take 1 tablet (10 mg total) by mouth daily. 04/17/15   Lorretta Harp, MD  lisinopril (PRINIVIL,ZESTRIL) 40 MG tablet Take 20 mg by mouth every morning.    Historical Provider, MD  methocarbamol (ROBAXIN) 500 MG tablet Take 1 tablet (500 mg total) by mouth every 6 (six) hours as needed for muscle spasms.  07/26/13   Aundra Dubin, PA-C  metoprolol tartrate (LOPRESSOR) 25 MG tablet Take 25 mg by mouth 2 (two) times daily.     Historical Provider, MD  oxyCODONE-acetaminophen (PERCOCET/ROXICET) 5-325 MG per tablet Take 1-2 tablets by mouth every 4 (four) hours as needed for severe pain. 07/28/13   Aundra Dubin, PA-C  pantoprazole (PROTONIX) 40 MG tablet Take 40 mg by mouth 2 (two) times daily.     Historical Provider, MD  simvastatin (ZOCOR) 80 MG tablet Take 80 mg by mouth at bedtime.      Historical Provider, MD  tamsulosin (FLOMAX) 0.4 MG CAPS capsule Take 0.4 mg by mouth daily after breakfast.     Historical Provider, MD    Allergies:  Allergies  Allergen Reactions  . Clarithromycin Swelling    Social History   Social History  . Marital Status: Married    Spouse Name: N/A  . Number of Children: N/A  . Years of Education: N/A   Occupational History  . Not on file.   Social History Main Topics  . Smoking status: Former Smoker    Quit date: 04/06/1983  . Smokeless tobacco: Not on file  . Alcohol Use: No  . Drug Use: No  . Sexual Activity: Not on file   Other Topics Concern  . Not on file   Social History Narrative     Family History  Problem Relation Age of Onset  . Hypertension Other   . Heart failure Mother   . Hypertension Mother     Review of Systems: General: negative for chills, fever, night sweats or weight changes.  ENT: negative for rhinorrhea or epistaxis Cardiovascular: see HPI Dermatological: negative for rash Respiratory: negative for cough or wheezing, positive for shortness of breath GI: negative for nausea, vomiting, diarrhea, bright red blood per rectum, melena, or hematemesis GU: no hematuria, urgency, or frequency Neurologic: negative for visual changes, syncope, headache, or dizziness Heme: no easy bruising or bleeding Endo: negative for excessive thirst, thyroid disorder, or flushing Musculoskeletal: negative for joint pain or swelling, negative for myalgias All other systems reviewed and are otherwise negative except as noted above.  Physical Exam: Blood pressure 132/76, pulse 85, temperature 98 F (36.7 C), temperature source Oral, resp. rate 18, height 5\' 8"  (1.727 m), weight 195 lb (88.451 kg), SpO2 100 %. General: Well developed, well nourished, alert and oriented, in no acute distress. HEENT: Normocephalic, atraumatic, sclera anicteric Neck: Supple. Carotids 2+ without bruits. JVP normal Lungs: Clear bilaterally to auscultation without wheezes, rales, or rhonchi. Breathing is unlabored. Heart: RRR with normal S1 and S2. No  murmurs, rubs, or gallops appreciated. Abdomen: Soft, non-tender, non-distended with normoactive bowel sounds. No hepatomegaly. No rebound/guarding. No obvious abdominal masses. Back: No CVA tenderness Msk:  Strength and tone appear normal for age. Extremities: No clubbing, cyanosis, or edema.  Distal pedal pulses are 2+ and equal bilaterally. Neuro: CNII-XII intact, moves all extremities spontaneously. Psych:  Responds to questions appropriately with a normal affect. Skin: warm and dry without rash   Labs:   Lab Results  Component Value Date   WBC 15.1* 04/27/2015   HGB 16.0 04/27/2015   HCT 47.0 04/27/2015   MCV 88.7 04/27/2015   PLT 297 04/27/2015    Recent Labs Lab 04/27/15 1223  NA 138  K 4.5  CL 104  BUN 15  CREATININE 1.00  GLUCOSE 224*   No results for input(s): CKTOTAL, CKMB, TROPONINI in the last 72 hours. No results found for:  CHOL, HDL, LDLCALC, TRIG No results found for: DDIMER  Radiology/Studies:  Dg Chest Portable 1 View  04/27/2015  CLINICAL DATA:  Possible cardiac arrest Hx of cancer, HTN, myocardial infarct. EXAM: PORTABLE CHEST - 1 VIEW COMPARISON:  07/26/2013 FINDINGS: Monitoring hardware overlies the patient. Previous median sternotomy. Heart size upper limits normal for technique. No confluent airspace infiltrate. Mild central pulmonary vascular congestion. No pneumothorax. No effusion. Visualized skeletal structures are unremarkable. IMPRESSION: 1. Borderline cardiomegaly with mild central pulmonary vascular congestion. Electronically Signed   By: Lucrezia Europe M.D.   On: 04/27/2015 12:36     EKG: Sinus rhythm with inferoposterior infarct pattern, despite old inferior MI this tracing meets criteria for STEMI with ST segment elevation from previous tracings  ASSESSMENT AND PLAN:  1. Sustained ventricular tachycardia, unstable, status post emergency cardioversion 2. Possible acute inferoposterior STEMI 3. Known CAD status post CABG, now 21 years ago 64.  Moderate carotid stenosis, managed medically 5. Essential hypertension  The patient is now stable on IV amiodarone. He has no chest pain. Considering his presentation with unstable sustained ventricular tachycardia and EKG meeting criteria for an acute inferoposterior STEMI, he will be taken emergently to the cardiac catheterization lab. Further disposition pending the results of his heart catheterization. He has received aspirin 324 mg and IV heparin 4000 units in the emergency department.  Deatra James MD 04/27/2015, 12:44 PM

## 2015-04-27 NOTE — ED Notes (Signed)
Plan to cardiovert patient. Verbal consent given from patient.

## 2015-04-27 NOTE — ED Notes (Signed)
Per ems- Pt reports was having sex with his wife, when he went to get up, he was SOB, dizzy and sweaty. Pt in [place] 190, given total 300 mg amiodarone. Initial BP 116/86, given 500 NS, recent BP 90/58. Reports feel tightness in neck. Pt is a x 4. Remains in [facility]

## 2015-04-27 NOTE — Sedation Documentation (Addendum)
Sync Defib 150 J. VT remains after defib.

## 2015-04-27 NOTE — Plan of Care (Signed)
IV heparin for post LHC MI started. SQ heparin for DVT prophylaxis stopped which could be restarted when IV heparin is stopped.

## 2015-04-27 NOTE — Plan of Care (Signed)
Patient does not have any symptoms at this time but his troponin post cath procedure was elevated to 11. Patient denied chest pain, dyspnea, PND, orthopnea, leg swelling, increased abdominal girth, syncope, presyncope, palpitations. He is being managed medically for now. Since there was recent procedure and groin was used, we will continue medical management without heparin as risk of groin hematoma/complication will be high at this time. If the patient develops symptoms, VT or hemodynamic instability, we will consider treating with IV heparin at that time. Repeat troponin will be drawn in 5 hours.

## 2015-04-27 NOTE — Progress Notes (Signed)
ANTICOAGULATION CONSULT NOTE - Initial Consult  Pharmacy Consult for heparin Indication: chest pain/ACS  Allergies  Allergen Reactions  . Clarithromycin Swelling    Patient Measurements: Height: 5\' 8"  (172.7 cm) Weight: 189 lb 9.5 oz (86 kg) IBW/kg (Calculated) : 68.4 Heparin Dosing Weight: 85 kg  Vital Signs: Temp: 99 F (37.2 C) (10/29 1945) Temp Source: Oral (10/29 1945) BP: 130/56 mmHg (10/29 2000) Pulse Rate: 64 (10/29 1945)  Labs:  Recent Labs  04/27/15 1202 04/27/15 1223 04/27/15 1640 04/27/15 1925  HGB 15.0 16.0 14.5  --   HCT 43.2 47.0 42.5  --   PLT 297  --  268  --   LABPROT 14.0  --   --   --   INR 1.06  --   --   --   CREATININE 1.15 1.00 0.99  --   TROPONINI  --   --  11.42* 18.55*    Estimated Creatinine Clearance: 75.1 mL/min (by C-G formula based on Cr of 0.99).  Assessment: 69 yo man admitted 04/27/2015 with CP and weakness. Trop is 18.55 after cath  [facility]: none pta  CV: post-LHC MI trop 18.55  Renal: SCr 0.99  Heme: H&H 14.5/42.5, Plt 268  Goal of Therapy:  Heparin level 0.3-0.7 units/ml Monitor platelets by anticoagulation protocol: Yes   Plan:  Heparin 1100 units/hr (no bolus with cath today) HL at 0500 Daily HL, CBC Monitor for s/sx of bleeding  Levester Fresh, PharmD, BCPS Clinical Pharmacist Pager [phone] 04/27/2015 8:46 PM

## 2015-04-28 ENCOUNTER — Other Ambulatory Visit (HOSPITAL_COMMUNITY): Payer: Medicare Other

## 2015-04-28 DIAGNOSIS — I472 Ventricular tachycardia: Principal | ICD-10-CM

## 2015-04-28 LAB — BASIC METABOLIC PANEL
ANION GAP: 7 (ref 5–15)
BUN: 9 mg/dL (ref 6–20)
CO2: 25 mmol/L (ref 22–32)
Calcium: 8.5 mg/dL — ABNORMAL LOW (ref 8.9–10.3)
Chloride: 105 mmol/L (ref 101–111)
Creatinine, Ser: 0.9 mg/dL (ref 0.61–1.24)
Glucose, Bld: 129 mg/dL — ABNORMAL HIGH (ref 65–99)
POTASSIUM: 3.8 mmol/L (ref 3.5–5.1)
SODIUM: 137 mmol/L (ref 135–145)

## 2015-04-28 LAB — TROPONIN I: TROPONIN I: 16.55 ng/mL — AB (ref <0.031)

## 2015-04-28 LAB — CBC
HCT: 41.3 % (ref 39.0–52.0)
Hemoglobin: 14.1 g/dL (ref 13.0–17.0)
MCH: 30.8 pg (ref 26.0–34.0)
MCHC: 34.1 g/dL (ref 30.0–36.0)
MCV: 90.2 fL (ref 78.0–100.0)
PLATELETS: 254 10*3/uL (ref 150–400)
RBC: 4.58 MIL/uL (ref 4.22–5.81)
RDW: 12.7 % (ref 11.5–15.5)
WBC: 10.5 10*3/uL (ref 4.0–10.5)

## 2015-04-28 LAB — HEPARIN LEVEL (UNFRACTIONATED)
HEPARIN UNFRACTIONATED: 0.29 [IU]/mL — AB (ref 0.30–0.70)
HEPARIN UNFRACTIONATED: 0.32 [IU]/mL (ref 0.30–0.70)

## 2015-04-28 LAB — LACTIC ACID, PLASMA: Lactic Acid, Venous: 1.8 mmol/L (ref 0.5–2.0)

## 2015-04-28 MED ORDER — INFLUENZA VAC SPLIT QUAD 0.5 ML IM SUSY: 0.5000 mL | PREFILLED_SYRINGE | INTRAMUSCULAR | Status: DC | PRN

## 2015-04-28 MED ORDER — AMIODARONE HCL 200 MG PO TABS
400.0000 mg | ORAL_TABLET | Freq: Two times a day (BID) | ORAL | Status: DC
  Administered 2015-04-28 – 2015-04-29 (×3): 400 mg via ORAL
  Filled 2015-04-28 (×3): qty 2

## 2015-04-28 MED ORDER — CLOPIDOGREL BISULFATE 75 MG PO TABS
75.0000 mg | ORAL_TABLET | Freq: Every day | ORAL | Status: DC
  Administered 2015-04-28 – 2015-04-30 (×3): 75 mg via ORAL
  Filled 2015-04-28 (×3): qty 1

## 2015-04-28 MED ORDER — METOPROLOL TARTRATE 12.5 MG HALF TABLET
12.5000 mg | ORAL_TABLET | Freq: Two times a day (BID) | ORAL | Status: DC
  Administered 2015-04-28 – 2015-04-29 (×3): 12.5 mg via ORAL
  Filled 2015-04-28 (×3): qty 1

## 2015-04-28 MED ORDER — AMIODARONE HCL IN DEXTROSE 360-4.14 MG/200ML-% IV SOLN: 30.0000 mg/h | INTRAVENOUS | Status: AC

## 2015-04-28 MED ORDER — GUAIFENESIN-DM 100-10 MG/5ML PO SYRP
5.0000 mL | ORAL_SOLUTION | ORAL | Status: DC | PRN
  Administered 2015-04-28 – 2015-04-29 (×2): 5 mL via ORAL
  Filled 2015-04-28 (×2): qty 5

## 2015-04-28 MED ORDER — HEPARIN (PORCINE) IN NACL 100-0.45 UNIT/ML-% IJ SOLN
1500.0000 [IU]/h | INTRAMUSCULAR | Status: AC
  Administered 2015-04-28: 1500 [IU]/h via INTRAVENOUS
  Filled 2015-04-28 (×2): qty 250

## 2015-04-28 NOTE — Progress Notes (Signed)
Patient ID: ERIQ HUFFORD, male   DOB: May 22, 1946, 69 y.o.   MRN: [id number]     Subjective:    No complaints this AM  Objective:   Temp:  [97.7 F (36.5 C)-99 F (37.2 C)] 97.7 F (36.5 C) (10/30 0353) Pulse Rate:  [49-183] 49 (10/30 0353) Resp:  [8-26] 21 (10/30 0600) BP: (93-170)/(55-99) 134/63 mmHg (10/30 0600) SpO2:  [94 %-100 %] 95 % (10/30 0600) Weight:  [189 lb 9.5 oz (86 kg)-195 lb (88.451 kg)] 189 lb 9.5 oz (86 kg) (10/29 1415)    Filed Weights   04/27/15 1231 04/27/15 1415  Weight: 195 lb (88.451 kg) 189 lb 9.5 oz (86 kg)    Intake/Output Summary (Last 24 hours) at 04/28/15 0713 Last data filed at 04/28/15 0600  Gross per 24 hour  Intake 1661.59 ml  Output   2400 ml  Net -738.41 ml    Telemetry: NSR  Exam:  General: NAD  Resp: CTAB  Cardiac: RRR, no m/r/g, no jvd  GI: abdomen soft, NT, ND  MSK: no LE edema  Neuro: no focal deficits  Psych: appropriate affect  Lab Results:  Basic Metabolic Panel:  Recent Labs Lab 04/27/15 1202 04/27/15 1223 04/27/15 1640 04/28/15 0530  NA 133* 138  --  137  K 4.9 4.5  --  3.8  CL 102 104  --  105  CO2 20*  --   --  25  GLUCOSE 220* 224*  --  129*  BUN 11 15  --  9  CREATININE 1.15 1.00 0.99 0.90  CALCIUM 8.6*  --   --  8.5*  MG 2.1  --   --   --     Liver Function Tests: No results for input(s): AST, ALT, ALKPHOS, BILITOT, PROT, ALBUMIN in the last 168 hours.  CBC:  Recent Labs Lab 04/27/15 1202 04/27/15 1223 04/27/15 1640 04/28/15 0300  WBC 15.1*  --  14.4* 10.5  HGB 15.0 16.0 14.5 14.1  HCT 43.2 47.0 42.5 41.3  MCV 88.7  --  89.3 90.2  PLT 297  --  268 254    Cardiac Enzymes:  Recent Labs Lab 04/27/15 1640 04/27/15 1925 04/28/15 0030  TROPONINI 11.42* 18.55* 16.55*    BNP: No results for input(s): PROBNP in the last 8760 hours.  Coagulation:  Recent Labs Lab 04/27/15 1202  INR 1.06    ECG:   Medications:   Scheduled Medications: . aspirin EC  81 mg Oral  Q breakfast  . atorvastatin  40 mg Oral q1800  . cholecalciferol  2,000 Units Oral Daily  . ezetimibe  10 mg Oral Daily  . Influenza vac split quadrivalent PF  0.5 mL Intramuscular Tomorrow-1000  . lisinopril  20 mg Oral q morning - 10a  . metoprolol tartrate  25 mg Oral BID  . pantoprazole  40 mg Oral BID  . sodium chloride  3 mL Intravenous Q12H  . tamsulosin  0.4 mg Oral QPC breakfast     Infusions: . amiodarone 30 mg/hr (04/28/15 0510)  . heparin 1,400 Units/hr (04/28/15 0509)     PRN Medications:  sodium chloride, acetaminophen, diazepam, methocarbamol, ondansetron (ZOFRAN) IV, oxyCODONE-acetaminophen, sodium chloride     Assessment/Plan    1. VT - admitted with sustained VT, s/p emergent cardioversion. He did not lose pulse. K 4.9 Mg 2.1 - EKG s/p conversion worrisome for inferior STEMI, taken for cath - cath 04/27/15 with severe native disease with patent LIMA-LAD, SVG-OM. Likely chronically occluded SVG-RCA. No targets  for pci. Moderate to severe LV systolic dysfunction by LV gram, no echo as of yet. Echo 2014 LVEF 40-45%. Repeat echo ordered this AM.  - on IV amio (started 04/27/15 at 1200pm) , awaiting EP evaluation for ICD consideration. Probable scar induced/LV dysfunction induced VT. Will change to amio 400mg  bid after 24 hr load completed today.    2. CAD - known history of CAD with CABG 21 years ago - cath as described above, no pci targets.  - trop trending up to 11.4-->18.5-->16.5 -medical therapy with ASA, atorva 40, lisinopril 20, lopressor 25 bid, hep gtt - will plan for 48 hrs of heparin for medical management of NSTEMI (started 04/27/15 9 pm). Start plavix for medical management of NSTEMI.   3. Chronic systolic HF - echo 3729 LVEF 40-45%, moderate to severe dysfunction reported on cath by LV gram - repeat echo pending today. - if persistent LV systolic dysfunction change lopressor to coreg.   4. Sinus brady - follow for now, may improve off amio  drip later today. Will decrease lopressor to 12.5mg  bid.   5. Lactic acidosis - elevated at 4.2 on admit - stable hemodynamically, will repeat.    Carlyle Dolly, M.D.

## 2015-04-28 NOTE — Progress Notes (Addendum)
ANTICOAGULATION CONSULT NOTE - Follow Up Consult  Pharmacy Consult for heparin Indication: chest pain/ACS  Allergies  Allergen Reactions  . Clarithromycin Swelling    Patient Measurements: Height: 5\' 8"  (172.7 cm) Weight: 189 lb 9.5 oz (86 kg) IBW/kg (Calculated) : 68.4 Heparin Dosing Weight: 85 kg  Vital Signs: Temp: 98.4 F (36.9 C) (10/30 1200) Temp Source: Oral (10/30 1200) BP: 132/67 mmHg (10/30 1200) Pulse Rate: 50 (10/30 1200)  Labs:  Recent Labs  04/27/15 1202 04/27/15 1223 04/27/15 1640 04/27/15 1925 04/28/15 0030 04/28/15 0300 04/28/15 0530 04/28/15 1135  HGB 15.0 16.0 14.5  --   --  14.1  --   --   HCT 43.2 47.0 42.5  --   --  41.3  --   --   PLT 297  --  268  --   --  254  --   --   LABPROT 14.0  --   --   --   --   --   --   --   INR 1.06  --   --   --   --   --   --   --   HEPARINUNFRC  --   --   --   --   --  <0.10*  --  0.29*  CREATININE 1.15 1.00 0.99  --   --   --  0.90  --   TROPONINI  --   --  11.42* 18.55* 16.55*  --   --   --     Estimated Creatinine Clearance: 82.6 mL/min (by C-G formula based on Cr of 0.9).  Assessment: 69 yo man admitted 04/27/2015 for STEMI and sustained VT. LHC on 10/29 showed severe native multivessel CAD no targets for PCI or acute plaque rupture/culprit vessel. Pharmacy consulted to dose heparin for medical mgmt.  PMH CAD s/p CABG (1995), GERD, HLD, HTN, CA   HL 0.29 slightly subtherapeutic on heparin 1450 units/h. CBC wnl, no bleeding noted.   Goal of Therapy:  Heparin level 0.3-0.7 units/ml Monitor platelets by anticoagulation protocol: Yes   Plan:  Increase to heparin 1500 units/hr Monitor for s/sx of bleeding 48h heparin to end tonight at 2100    [facility], [place].D. PGY2 Cardiology Pharmacy Resident Pager: [phone]  04/28/2015 1:01 PM

## 2015-04-28 NOTE — Consult Note (Signed)
Reason for Consult:VT  Referring Physician: Dr. Michel Estes [name] is an 69 y.o. male.   HPI: The patient is a 69 yo man with a longstanding h/o CAD, s/p CABG almost 20 years ago who presented with near syncope in the setting of hemodynamically unstable VT. He underwent catheterization by Dr. Burt Estes, see results which demonstrated severe 3 vessel CAD, graft disease but no candidates for revacularization. He feels well. He has never had any syncope in the past or documented VT. He has minimal CHF. 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   PMH: Past Medical History  Diagnosis Date  . Myocardial infarct [facility])     coronary artery bypass grafting in 1995  . Cancer ([place])   . GERD (gastroesophageal reflux disease)   . Hyperlipemia   . Carotid artery disease (Victor Estes)   . Hypertension     dr Victor Estes  . Ventricular tachycardia, sustained (Victor Estes) 04/27/2015    PSHX: Past Surgical History  Procedure Laterality Date  . Cardiac surgery    . Colon surgery    . Elbow surgery    . Lumbar laminectomy/decompression microdiscectomy  09/09/2011    Procedure: LUMBAR LAMINECTOMY/DECOMPRESSION MICRODISCECTOMY 1 LEVEL;  Surgeon: Eustace Moore, MD;  Location: [place] [facility];  Service: Neurosurgery;  Laterality: Right;  Right Lumbar Five-Sacral One Hemilaminectomy    . Back surgery    . Cholecystectomy    . Coronary artery bypass graft      x4    dr Redmond Pulling  . Total knee arthroplasty Left 07/26/2013    Procedure: TOTAL KNEE ARTHROPLASTY;  Surgeon: Ninetta Lights, MD;  Location: [place];  Service: Orthopedics;  Laterality: Left;    FAMHX: Family History  Problem Relation Age of Onset  . Hypertension Other   . Heart failure Mother   . Hypertension Mother     Social History:  reports that he quit smoking about 32 years ago. He does not have any smokeless tobacco history on file. He reports that he does not drink alcohol or use illicit drugs.  Allergies:  Allergies    Allergen Reactions  . Clarithromycin Swelling    Medications: I have reviewed the patient's current medications.  Dg Chest Portable 1 View  04/27/2015  CLINICAL DATA:  Possible cardiac arrest Hx of cancer, HTN, myocardial infarct. EXAM: PORTABLE CHEST - 1 VIEW COMPARISON:  07/26/2013 FINDINGS: Monitoring hardware overlies the patient. Previous median sternotomy. Heart size upper limits normal for technique. No confluent airspace infiltrate. Mild central pulmonary vascular congestion. No pneumothorax. No effusion. Visualized skeletal structures are unremarkable. IMPRESSION: 1. Borderline cardiomegaly with mild central pulmonary vascular congestion. Electronically Signed   By: Lucrezia Europe M.D.   On: 04/27/2015 12:36    ROS  As stated in the HPI and negative for all other systems.  Physical Exam  Vitals:Blood pressure 134/63, pulse 49, temperature 97.7 F (36.5 C), temperature source Oral, resp. rate 21, height 5\' 8"  (1.727 m), weight 189 lb 9.5 oz (86 kg), SpO2 95 %.  Well appearing NAD HEENT: Unremarkable Neck:  No JVD, no thyromegally Lymphatics:  No adenopathy Back:  No CVA tenderness Lungs:  Clear HEART:  Regular rate rhythm, no murmurs, no rubs, no clicks Abd:  Flat, positive bowel sounds, no organomegally, no rebound, no guarding Ext:  2 plus pulses, no edema, no cyanosis, no clubbing Skin:  No rashes no nodules Neuro:  CN II through XII intact, motor grossly intact   ECG -  VT at 190/min RBBB morphology  LABS - reivewed  Assessment/Plan: 1. Hemodynamically unstable VT 2. ICM, EF 35-40% 3. Class 1 CHF 4. Sinus node dysfunction with sinus bradycardia Rec: the patient is maintaining NSR on amiodarone. Will plan to proceed with ICD implant tomorrow. He is bradycardic and will likely need AA drug therapy. Will plan to place a DDD device.   Carleene Overlie TaylorMD 04/28/2015, 8:14 AM

## 2015-04-28 NOTE — Progress Notes (Signed)
ANTICOAGULATION CONSULT NOTE - Follow Up Consult  Pharmacy Consult for Heparin  Indication: chest pain/ACS, post-cath  Allergies  Allergen Reactions  . Clarithromycin Swelling   Patient Measurements: Height: 5\' 8"  (172.7 cm) Weight: 189 lb 9.5 oz (86 kg) IBW/kg (Calculated) : 68.4  Vital Signs: Temp: 97.7 F (36.5 C) (10/30 0353) Temp Source: Oral (10/30 0353) BP: 131/71 mmHg (10/30 0400) Pulse Rate: 49 (10/30 0353)  Labs:  Recent Labs  04/27/15 1202 04/27/15 1223 04/27/15 1640 04/27/15 1925 04/28/15 0030 04/28/15 0300  HGB 15.0 16.0 14.5  --   --  14.1  HCT 43.2 47.0 42.5  --   --  41.3  PLT 297  --  268  --   --  254  LABPROT 14.0  --   --   --   --   --   INR 1.06  --   --   --   --   --   HEPARINUNFRC  --   --   --   --   --  <0.10*  CREATININE 1.15 1.00 0.99  --   --   --   TROPONINI  --   --  11.42* 18.55* 16.55*  --    Estimated Creatinine Clearance: 75.1 mL/min (by C-G formula based on Cr of 0.99).  Assessment: Sub-therapeutic heparin level post-cath, no issues per RN.   Goal of Therapy:  Heparin level 0.3-0.7 units/ml Monitor platelets by anticoagulation protocol: Yes   Plan:  -Increase heparin drip to 1400 units/hr -1200 HL  Victor Estes 04/28/2015,4:58 AM

## 2015-04-28 NOTE — Progress Notes (Signed)
patietn has been up and ambulating in room without diffiulty, he is alert and oriented, has had no complaint of pain or discomfort today, he has ambulated on unit as well, one entire lap without shortness of breath or pain,  Wife at bedside will continue to monitor

## 2015-04-29 ENCOUNTER — Encounter (HOSPITAL_COMMUNITY): Payer: Self-pay | Admitting: Cardiovascular Disease

## 2015-04-29 ENCOUNTER — Encounter (HOSPITAL_COMMUNITY)
Admission: EM | Disposition: A | Discharge status: Home / Self Care | Payer: Self-pay | Source: Home / Self Care | Attending: Cardiovascular Disease

## 2015-04-29 DIAGNOSIS — I472 Ventricular tachycardia, unspecified: Secondary | ICD-10-CM

## 2015-04-29 DIAGNOSIS — I5022 Chronic systolic (congestive) heart failure: Secondary | ICD-10-CM

## 2015-04-29 DIAGNOSIS — R001 Bradycardia, unspecified: Secondary | ICD-10-CM

## 2015-04-29 DIAGNOSIS — I440 Atrioventricular block, first degree: Secondary | ICD-10-CM

## 2015-04-29 HISTORY — PX: EP IMPLANTABLE DEVICE: SHX172B

## 2015-04-29 LAB — HEPARIN LEVEL (UNFRACTIONATED)

## 2015-04-29 LAB — BASIC METABOLIC PANEL
Anion gap: 6 (ref 5–15)
BUN: 9 mg/dL (ref 6–20)
CALCIUM: 9.2 mg/dL (ref 8.9–10.3)
CHLORIDE: 109 mmol/L (ref 101–111)
CO2: 28 mmol/L (ref 22–32)
CREATININE: 0.97 mg/dL (ref 0.61–1.24)
GFR calc Af Amer: >60 mL/min (ref >60)
GFR calc non Af Amer: >60 mL/min (ref >60)
GLUCOSE: 137 mg/dL — AB (ref 65–99)
Potassium: 4 mmol/L (ref 3.5–5.1)
Sodium: 143 mmol/L (ref 135–145)

## 2015-04-29 LAB — CBC
HEMATOCRIT: 42.8 % (ref 39.0–52.0)
Hemoglobin: 14.3 g/dL (ref 13.0–17.0)
MCH: 30.2 pg (ref 26.0–34.0)
MCHC: 33.4 g/dL (ref 30.0–36.0)
MCV: 90.5 fL (ref 78.0–100.0)
Platelets: 266 10*3/uL (ref 150–400)
RBC: 4.73 MIL/uL (ref 4.22–5.81)
RDW: 12.6 % (ref 11.5–15.5)
WBC: 11.3 10*3/uL — ABNORMAL HIGH (ref 4.0–10.5)

## 2015-04-29 SURGERY — ICD IMPLANT
Anesthesia: LOCAL

## 2015-04-29 MED ORDER — CEFAZOLIN SODIUM-DEXTROSE 2-3 GM-% IV SOLR: 2.0000 g | INTRAVENOUS | Status: DC

## 2015-04-29 MED ORDER — SODIUM CHLORIDE 0.9 % IV SOLN
INTRAVENOUS | Status: DC
  Administered 2015-04-29: 12:00:00 via INTRAVENOUS

## 2015-04-29 MED ORDER — CEFAZOLIN SODIUM 1-5 GM-% IV SOLN
1.0000 g | Freq: Four times a day (QID) | INTRAVENOUS | Status: AC
  Administered 2015-04-29 – 2015-04-30 (×3): 1 g via INTRAVENOUS
  Filled 2015-04-29 (×3): qty 50

## 2015-04-29 MED ORDER — CHLORHEXIDINE GLUCONATE 4 % EX LIQD: 60.0000 mL | Freq: Once | CUTANEOUS | Status: AC

## 2015-04-29 MED ORDER — CEFAZOLIN SODIUM-DEXTROSE 2-3 GM-% IV SOLR
2.0000 g | INTRAVENOUS | Status: DC
  Filled 2015-04-29: qty 50

## 2015-04-29 MED ORDER — CHLORHEXIDINE GLUCONATE 4 % EX LIQD
CUTANEOUS | Status: AC
  Administered 2015-04-29: 10:00:00
  Filled 2015-04-29: qty 45

## 2015-04-29 MED ORDER — CEFAZOLIN SODIUM-DEXTROSE 2-3 GM-% IV SOLR
2.0000 g | INTRAVENOUS | Status: AC
  Administered 2015-04-29: 2 g via INTRAVENOUS
  Filled 2015-04-29: qty 50

## 2015-04-29 MED ORDER — SODIUM CHLORIDE 0.9 % IV SOLN: INTRAVENOUS | Status: DC

## 2015-04-29 MED ORDER — ONDANSETRON HCL 4 MG/2ML IJ SOLN: 4.0000 mg | Freq: Four times a day (QID) | INTRAMUSCULAR | Status: DC | PRN

## 2015-04-29 MED ORDER — ACETAMINOPHEN 325 MG PO TABS: 325.0000 mg | ORAL_TABLET | ORAL | Status: DC | PRN

## 2015-04-29 MED ORDER — HEPARIN (PORCINE) IN NACL 2-0.9 UNIT/ML-% IJ SOLN
INTRAMUSCULAR | Status: DC | PRN
  Administered 2015-04-29: 500 mL

## 2015-04-29 MED ORDER — FENTANYL CITRATE (PF) 100 MCG/2ML IJ SOLN
INTRAMUSCULAR | Status: AC
  Filled 2015-04-29: qty 4

## 2015-04-29 MED ORDER — SODIUM CHLORIDE 0.9 % IR SOLN
Status: AC
  Filled 2015-04-29: qty 2

## 2015-04-29 MED ORDER — HEPARIN (PORCINE) IN NACL 2-0.9 UNIT/ML-% IJ SOLN
INTRAMUSCULAR | Status: AC
  Filled 2015-04-29: qty 500

## 2015-04-29 MED ORDER — LIDOCAINE HCL (PF) 1 % IJ SOLN
INTRAMUSCULAR | Status: AC
  Filled 2015-04-29: qty 60

## 2015-04-29 MED ORDER — CEFAZOLIN SODIUM-DEXTROSE 2-3 GM-% IV SOLR
INTRAVENOUS | Status: AC
  Filled 2015-04-29: qty 50

## 2015-04-29 MED ORDER — SODIUM CHLORIDE 0.9 % IR SOLN: 80.0000 mg | Status: DC

## 2015-04-29 MED ORDER — GENTAMICIN SULFATE 40 MG/ML IJ SOLN
INTRAMUSCULAR | Status: DC | PRN
  Administered 2015-04-29: 12:00:00

## 2015-04-29 MED ORDER — DIAZEPAM 5 MG PO TABS
5.0000 mg | ORAL_TABLET | Freq: Three times a day (TID) | ORAL | Status: DC | PRN
  Administered 2015-04-29: 5 mg via ORAL
  Filled 2015-04-29: qty 1

## 2015-04-29 MED ORDER — LIDOCAINE HCL (PF) 1 % IJ SOLN
INTRAMUSCULAR | Status: DC | PRN
  Administered 2015-04-29: 50 mL

## 2015-04-29 MED ORDER — MIDAZOLAM HCL 5 MG/5ML IJ SOLN
INTRAMUSCULAR | Status: DC | PRN
  Administered 2015-04-29 (×2): 1 mg via INTRAVENOUS
  Administered 2015-04-29: 2 mg via INTRAVENOUS
  Administered 2015-04-29: 1 mg via INTRAVENOUS

## 2015-04-29 MED ORDER — FENTANYL CITRATE (PF) 100 MCG/2ML IJ SOLN
INTRAMUSCULAR | Status: DC | PRN
  Administered 2015-04-29 (×2): 25 ug via INTRAVENOUS
  Administered 2015-04-29: 50 ug via INTRAVENOUS

## 2015-04-29 MED ORDER — MIDAZOLAM HCL 5 MG/5ML IJ SOLN
INTRAMUSCULAR | Status: AC
  Filled 2015-04-29: qty 25

## 2015-04-29 MED ORDER — ZOLPIDEM TARTRATE 5 MG PO TABS
5.0000 mg | ORAL_TABLET | Freq: Every evening | ORAL | Status: DC | PRN
  Administered 2015-04-30: 5 mg via ORAL
  Filled 2015-04-29: qty 1

## 2015-04-29 MED ORDER — SODIUM CHLORIDE 0.9 % IV SOLN: INTRAVENOUS | Status: AC

## 2015-04-29 MED ORDER — CARVEDILOL 6.25 MG PO TABS
6.2500 mg | ORAL_TABLET | Freq: Two times a day (BID) | ORAL | Status: DC
  Administered 2015-04-29: 6.25 mg via ORAL
  Filled 2015-04-29: qty 1

## 2015-04-29 MED ORDER — SODIUM CHLORIDE 0.9 % IR SOLN
80.0000 mg | Status: DC
  Filled 2015-04-29: qty 2

## 2015-04-29 MED ORDER — SODIUM CHLORIDE 0.9 % IV SOLN
INTRAVENOUS | Status: DC
  Administered 2015-04-29: 08:00:00 via INTRAVENOUS

## 2015-04-29 MED FILL — Nitroglycerin IV Soln 100 MCG/ML in D5W: INTRA_ARTERIAL | Qty: 10 | Status: AC

## 2015-04-29 SURGICAL SUPPLY — 7 items
CABLE SURGICAL S-101-97-12 (CABLE) ×2 IMPLANT
HEMOSTAT SURGICEL 2X4 FIBR (HEMOSTASIS) ×2 IMPLANT
ICD ELLIPSE VR CD1411-36C (ICD Generator) ×2 IMPLANT
LEAD ENDOTAK RELIANCE 0181 (Lead) ×2 IMPLANT
PAD DEFIB LIFELINK (PAD) ×2 IMPLANT
SHEATH CLASSIC 9.5F (SHEATH) ×2 IMPLANT
TRAY PACEMAKER INSERTION (PACKS) ×2 IMPLANT

## 2015-04-29 NOTE — Progress Notes (Signed)
Orthopedic Tech Progress Note Patient Details:  Victor Estes Jul 23, 1945 [id number]  Ortho Devices Type of Ortho Device: Arm sling Ortho Device/Splint Interventions: Application   Victor Estes 04/29/2015, 5:45 PM

## 2015-04-29 NOTE — Progress Notes (Signed)
PATIENT ID: [id number] with CAD s/p CABG, chronic systolic heart failure LVEF 35-40%, sinus node dysfunction, hypertension, and hyperlipidemia here with hemodynamically unstable VT.  Currently awaiting ICD  INTERVAL HISTORY: No events overnight.  NPO for ICD.  SUBJECTIVE:  Feels well.  No chest pain or SOB.  He has a mild cough that is non-productive.  PHYSICAL EXAM Filed Vitals:   04/29/15 0300 04/29/15 0400 04/29/15 0500 04/29/15 0727  BP: 132/61 132/66 113/101   Pulse:      Temp:  98.2 F (36.8 C)  98 F (36.7 C)  TempSrc:  Oral  Oral  Resp:      Height:      Weight:      SpO2: 95% 98% 99%    General:  Well-appearing.  NAD. Lungs:  CTAB Heart:  RRR.  No m/r/g.  Nl S1/S2.  No JVD. Abdomen:  Soft, NT, ND.  +BS Extremities:  WWP.  No edema  LABS: Lab Results  Component Value Date   TROPONINI 16.55* 04/28/2015   Results for orders placed or performed during the hospital encounter of 04/27/15 (from the past 24 hour(s))  Lactic acid, plasma     Status: None   Collection Time: 04/28/15  8:03 AM  Result Value Ref Range   Lactic Acid, Venous 1.8 0.5 - 2.0 mmol/L  Heparin level (unfractionated)     Status: Abnormal   Collection Time: 04/28/15 11:35 AM  Result Value Ref Range   Heparin Unfractionated 0.29 (L) 0.30 - 0.70 IU/mL  Heparin level (unfractionated)     Status: None   Collection Time: 04/28/15  7:40 PM  Result Value Ref Range   Heparin Unfractionated 0.32 0.30 - 0.70 IU/mL  Heparin level (unfractionated)     Status: Abnormal   Collection Time: 04/29/15  4:22 AM  Result Value Ref Range   Heparin Unfractionated <0.10 (L) 0.30 - 0.70 IU/mL  CBC     Status: Abnormal   Collection Time: 04/29/15  4:22 AM  Result Value Ref Range   WBC 11.3 (H) 4.0 - 10.5 K/uL   RBC 4.73 4.22 - 5.81 MIL/uL   Hemoglobin 14.3 13.0 - 17.0 g/dL   HCT 42.8 39.0 - 52.0 %   MCV 90.5 78.0 - 100.0 fL   MCH 30.2 26.0 - 34.0 pg   MCHC 33.4 30.0 - 36.0 g/dL   RDW 12.6 11.5 - 15.5 %   Platelets  266 150 - 400 K/uL  Basic metabolic panel     Status: Abnormal   Collection Time: 04/29/15  4:22 AM  Result Value Ref Range   Sodium 143 135 - 145 mmol/L   Potassium 4.0 3.5 - 5.1 mmol/L   Chloride 109 101 - 111 mmol/L   CO2 28 22 - 32 mmol/L   Glucose, Bld 137 (H) 65 - 99 mg/dL   BUN 9 6 - 20 mg/dL   Creatinine, Ser 0.97 0.61 - 1.24 mg/dL   Calcium 9.2 8.9 - 10.3 mg/dL   GFR calc non Af Amer >60 >60 mL/min   GFR calc Af Amer >60 >60 mL/min   Anion gap 6 5 - 15    Intake/Output Summary (Last 24 hours) at 04/29/15 0756 Last data filed at 04/29/15 0100  Gross per 24 hour  Intake 1418.36 ml  Output   1250 ml  Net 168.36 ml    EKG: n/a  Telemetry: bradycardia to the 40s.   Occasional PVCs.  LHC 04/27/15:  LIMA .  The LIMA sequenced to the  first diagonal and mid LAD. The graft is widely patent with no obstruction.  SVG .  The saphenous vein graft to first OM is widely patent. The OM target is a large caliber vessel that is totally occluded before the graft insertion, but the native vessel is widely patent beyond the graft insertion site.  Ost 1st Mrg to 1st Mrg lesion, 100% stenosed.  SVG .  The saphenous vein graft, presumably to distal RCA, 100% occluded at the aortic anastomosis  Origin lesion, 100% stenosed.  LM lesion, 80% stenosed.  Ost Cx lesion, 90% stenosed.  Prox LAD lesion, 90% stenosed.  Ost RCA lesion, 95% stenosed.  Prox RCA lesion, 90% stenosed.  Mid RCA lesion, 80% stenosed.  There is moderate to severe left ventricular systolic dysfunction.  ASSESSMENT AND PLAN:  1. VT: Admitted with sustained VT at 190 bpm requiring emergent DCCV.  Cath showed 3 vessel CAD not amenable to PCI.  He was started on amiodarone IV and will start PO amio today. - Switch amiodarone to 400mg  bid.  Plan for 10g load followed by 400mg  daily - PFTs as an outpatient - Echo pending  2. CAD, NSTEMI (trop 16.5):  Known history of CAD with CABG 21 years ago.  Cath as  described above, no pci targets.  - medical therapy with ASA, atorva 40, lisinopril 20, lopressor 25 bid, hep gtt - will plan for 48 hrs of heparin for medical management of NSTEMI (started 04/27/15 9 pm) - Started plavix for medical management of NSTEMI.   3. Chronic systolic HF: Echo 2924 LVEF 40-45%, moderate to severe dysfunction reported on cath by LV gram - repeat echo pending today. - if persistent LV systolic dysfunction change lopressor to coreg.   4. Sinus brady: Asymptomatic.  Will get ICD today. Will switch metoprolol to carvedilol to avoid pacing if possible.  5. Lactic acidosis: Resolved. - elevated at 4.2 on admit.  Improved to 1.8 yesterday.   Active Problems:   Ventricular tachycardia, sustained (HCC)   Ventricular tachycardia (HCC)    Sharol Harness, MD 04/29/2015 7:56 AM

## 2015-04-29 NOTE — H&P (View-Only) (Signed)
The patient had sustained monomorphic ventricular tachycardia in the setting of ischemic heart disease prior bypass surgery and depressed LV function. ICD implantation for secondary prevention is indicated.  He also has sinus bradycardia with rates in the 40s first-degree AV block with PR interval greater than 300 ms. However, he has very few limitations in activity hence, LV pacing for support or improvement of functional status is not indicated. Carvedilol doses have been minimized because of bradycardia, but there no data of which I am aware that augmented beta blockers above is 6.25 justifies the increased risks associated with an atrial lead. Hence, given his paucity of symptoms related to his bradycardia or first-degree AV block we'll anticipate single chamber ICD implantation

## 2015-04-29 NOTE — Progress Notes (Signed)
SUBJECTIVE: The patient is doing well today.  At this time, he denies chest pain, shortness of breath, or any new concerns.  [name] amiodarone  400 mg Oral BID  . aspirin EC  81 mg Oral Q breakfast  . atorvastatin  40 mg Oral q1800  . carvedilol  6.25 mg Oral BID WC  .  ceFAZolin (ANCEF) IV  2 g Intravenous On Call  . cholecalciferol  2,000 Units Oral Daily  . clopidogrel  75 mg Oral Daily  . ezetimibe  10 mg Oral Daily  . gentamicin irrigation  80 mg Irrigation On Call  . lisinopril  20 mg Oral q morning - 10a  . pantoprazole  40 mg Oral BID  . sodium chloride  3 mL Intravenous Q12H  . tamsulosin  0.4 mg Oral QPC breakfast   . sodium chloride 50 mL/hr at 04/29/15 0829    OBJECTIVE: Physical Exam: Filed Vitals:   04/29/15 0400 04/29/15 0500 04/29/15 0727 04/29/15 0800  BP: 132/66 113/101  137/66  Pulse:      Temp: 98.2 F (36.8 C)  98 F (36.7 C)   TempSrc: Oral  Oral   Resp:      Height:      Weight:      SpO2: 98% 99%  96%    Intake/Output Summary (Last 24 hours) at 04/29/15 0859 Last data filed at 04/29/15 0800  Gross per 24 hour  Intake 986.96 ml  Output   1375 ml  Net -388.04 ml    Telemetry reveals sinus rhythm, sinus bradycardia, 40's-50's, 1st degree AVBlock, no further VT noted  GEN- The patient is well appearing, alert and oriented x 3 today.   Head- normocephalic, atraumatic Eyes-  Sclera clear, conjunctiva pink Ears- hearing intact Oropharynx- clear Neck- supple, no JVP Lungs- Clear to ausculation bilaterally, normal work of breathing Heart- Regular rate and rhythm, no significant murmurs, no rubs or gallops GI- soft, NT, ND, + BS Extremities- no clubbing, cyanosis, or edema Skin- no rash or lesion Psych- euthymic mood, full affect Neuro- no gross deficits appreciated  LABS: Basic Metabolic Panel:  Recent Labs  04/27/15 1202  04/28/15 0530 04/29/15 0422  NA 133*  < > 137 143  K 4.9  < > 3.8 4.0  CL 102  < > 105 109  CO2 20*  --  25  28  GLUCOSE 220*  < > 129* 137*  BUN 11  < > 9 9  CREATININE 1.15  < > 0.90 0.97  CALCIUM 8.6*  --  8.5* 9.2  MG 2.1  --   --   --     CBC:  Recent Labs  04/27/15 1202  04/28/15 0300 04/29/15 0422  WBC 15.1*  < > 10.5 11.3*  NEUTROABS 12.0*  --   --   --   HGB 15.0  < > 14.1 14.3  HCT 43.2  < > 41.3 42.8  MCV 88.7  < > 90.2 90.5  PLT 297  < > 254 266  < > = values in this interval not displayed. Cardiac Enzymes:  Recent Labs  04/27/15 1640 04/27/15 1925 04/28/15 0030  TROPONINI 11.42* 18.55* 16.55*   04/27/15: Cardiac Cath 1. Severe native multivessel coronary artery disease with severe left main stenosis, proximal LAD stenosis, proximal left circumflex stenosis, and severe diffuse RCA stenosis 2. Status post remote aortocoronary bypass surgery with continued patency of the sequential LIMA graft to the first diagonal and LAD, and continued patency of the  saphenous vein graft obtuse marginal. 3. Likely chronic occlusion of the saphenous vein graft RCA 4. Moderately severe LV segmental dysfunction consistent with this patient's known history of old inferior wall MI I do not see any targets for PCI, nor do I see evidence of an acute plaque rupture or culprit vessel. I suspect this patient had scar mediated ventricular tachycardia. We'll keep him on IV amiodarone and place an EP consult for consideration of an ICD for secondary prevention of sudden cardiac death.  RADIOLOGY: Dg Chest Portable 1 View 04/27/2015  CLINICAL DATA:  Possible cardiac arrest Hx of cancer, HTN, myocardial infarct. EXAM: PORTABLE CHEST - 1 VIEW COMPARISON:  07/26/2013 FINDINGS: Monitoring hardware overlies the patient. Previous median sternotomy. Heart size upper limits normal for technique. No confluent airspace infiltrate. Mild central pulmonary vascular congestion. No pneumothorax. No effusion. Visualized skeletal structures are unremarkable. IMPRESSION: 1. Borderline cardiomegaly with mild central  pulmonary vascular congestion. Electronically Signed   By: Lucrezia Europe M.D.   On: 04/27/2015 12:36    ASSESSMENT AND PLAN:  1. Ventricular tachycardia, sustained (HCC)      On amiodarone      Plan to implant ICD today, indication, procedure risks and benefits discussed in detail with the pt by Dr. Caryl Comes and he is agreeable to proceed.      The patient reports syncope at home with this event, he is instructed no driving for 71months      EKG for today   2. CAD, s/p cath with severe CAD, not revascularizable     No CP, no SOB here     Last Troponin decreasing 3. ICM 4. HTN 5. Hyperlipidemia     In BB/ACE, ASA/Plavix, statin     Continue care with primary cardiologist   Tommye Standard, PA-C 04/29/2015 8:59 AM   D/c amiodarone For ICD today  See attached note

## 2015-04-29 NOTE — Interval H&P Note (Signed)
ICD Criteria  Current LVEF:25%. Within 12 months prior to implant: Yes   Heart failure history: Yes, Class I  Cardiomyopathy history: Yes, Ischemic Cardiomyopathy.  Atrial Fibrillation/Atrial Flutter: No.  Ventricular tachycardia history: Yes, Hemodynamic instability present. VT Type: Sustained Ventricular Tachycardia - Monomorphic.  Cardiac arrest history: No.  History of syndromes with risk of sudden death: No.  Previous ICD: No.  Current ICD indication: Primary  PPM indication: No.   Class I or II Bradycardia indication present: No  Beta Blocker therapy for 3 or more months: Yes, prescribed.   Ace Inhibitor/ARB therapy for 3 or more months: Yes, prescribed.   History and Physical Interval Note:  04/29/2015 12:33 PM  Victor Estes  has presented today for surgery, with the diagnosis of VT  The various methods of treatment have been discussed with the patient and family. After consideration of risks, benefits and other options for treatment, the patient has consented to  Procedure(s): ICD Implant (N/A) as a surgical intervention .  The patient's history has been reviewed, patient examined, no change in status, stable for surgery.  I have reviewed the patient's chart and labs.  Questions were answered to the patient's satisfaction.     Virl Axe

## 2015-04-29 NOTE — Progress Notes (Signed)
The patient had sustained monomorphic ventricular tachycardia in the setting of ischemic heart disease prior bypass surgery and depressed LV function. ICD implantation for secondary prevention is indicated.  He also has sinus bradycardia with rates in the 40s first-degree AV block with PR interval greater than 300 ms. However, he has very few limitations in activity hence, LV pacing for support or improvement of functional status is not indicated. Carvedilol doses have been minimized because of bradycardia, but there no data of which I am aware that augmented beta blockers above is 6.25 justifies the increased risks associated with an atrial lead. Hence, given his paucity of symptoms related to his bradycardia or first-degree AV block we'll anticipate single chamber ICD implantation

## 2015-04-30 ENCOUNTER — Encounter (HOSPITAL_COMMUNITY): Payer: Self-pay | Admitting: Student

## 2015-04-30 ENCOUNTER — Inpatient Hospital Stay (HOSPITAL_COMMUNITY): Payer: Medicare Other

## 2015-04-30 ENCOUNTER — Ambulatory Visit (HOSPITAL_COMMUNITY): Payer: Medicare Other

## 2015-04-30 ENCOUNTER — Telehealth: Payer: Self-pay | Admitting: Physician Assistant

## 2015-04-30 DIAGNOSIS — I429 Cardiomyopathy, unspecified: Secondary | ICD-10-CM

## 2015-04-30 DIAGNOSIS — I10 Essential (primary) hypertension: Secondary | ICD-10-CM

## 2015-04-30 DIAGNOSIS — I214 Non-ST elevation (NSTEMI) myocardial infarction: Secondary | ICD-10-CM

## 2015-04-30 LAB — CBC
HEMATOCRIT: 40.1 % (ref 39.0–52.0)
HEMOGLOBIN: 13.5 g/dL (ref 13.0–17.0)
MCH: 30.4 pg (ref 26.0–34.0)
MCHC: 33.7 g/dL (ref 30.0–36.0)
MCV: 90.3 fL (ref 78.0–100.0)
Platelets: 232 10*3/uL (ref 150–400)
RBC: 4.44 MIL/uL (ref 4.22–5.81)
RDW: 12.5 % (ref 11.5–15.5)
WBC: 11.8 10*3/uL — ABNORMAL HIGH (ref 4.0–10.5)

## 2015-04-30 LAB — BASIC METABOLIC PANEL
Anion gap: 9 (ref 5–15)
BUN: 10 mg/dL (ref 6–20)
CHLORIDE: 103 mmol/L (ref 101–111)
CO2: 26 mmol/L (ref 22–32)
CREATININE: 0.94 mg/dL (ref 0.61–1.24)
Calcium: 8.6 mg/dL — ABNORMAL LOW (ref 8.9–10.3)
GFR calc Af Amer: >60 mL/min (ref >60)
GFR calc non Af Amer: >60 mL/min (ref >60)
GLUCOSE: 119 mg/dL — AB (ref 65–99)
Potassium: 3.8 mmol/L (ref 3.5–5.1)
Sodium: 138 mmol/L (ref 135–145)

## 2015-04-30 LAB — MAGNESIUM: Magnesium: 1.8 mg/dL (ref 1.7–2.4)

## 2015-04-30 MED ORDER — ATORVASTATIN CALCIUM 40 MG PO TABS: 40.0000 mg | ORAL_TABLET | Freq: Every day | ORAL | Status: DC

## 2015-04-30 MED ORDER — POTASSIUM CHLORIDE CRYS ER 20 MEQ PO TBCR
40.0000 meq | EXTENDED_RELEASE_TABLET | Freq: Once | ORAL | Status: AC
  Administered 2015-04-30: 40 meq via ORAL
  Filled 2015-04-30: qty 2

## 2015-04-30 MED ORDER — MAGNESIUM SULFATE 2 GM/50ML IV SOLN
2.0000 g | Freq: Once | INTRAVENOUS | Status: AC
Start: 2015-04-30 — End: 2015-04-30
  Administered 2015-04-30: 2 g via INTRAVENOUS
  Filled 2015-04-30: qty 50

## 2015-04-30 MED ORDER — AMIODARONE HCL 200 MG PO TABS: 400.0000 mg | ORAL_TABLET | Freq: Two times a day (BID) | ORAL | Status: DC

## 2015-04-30 MED ORDER — ASPIRIN 81 MG PO TBEC: 81.0000 mg | DELAYED_RELEASE_TABLET | Freq: Every day | ORAL | Status: DC

## 2015-04-30 MED ORDER — CARVEDILOL 6.25 MG PO TABS: 6.2500 mg | ORAL_TABLET | Freq: Two times a day (BID) | ORAL | Status: DC

## 2015-04-30 MED ORDER — LISINOPRIL 20 MG PO TABS: 20.0000 mg | ORAL_TABLET | Freq: Every morning | ORAL | Status: DC

## 2015-04-30 NOTE — Progress Notes (Addendum)
PATIENT ID: [id number] with CAD s/p CABG, chronic systolic heart failure LVEF 35-40%, sinus node dysfunction, hypertension, and hyperlipidemia here with hemodynamically unstable VT.  Currently awaiting ICD  INTERVAL HISTORY: Successful placement of [employer] single chamber ICD.  SUBJECTIVE:  Feels well.  No chest pain or SOB.    PHYSICAL EXAM Filed Vitals:   04/30/15 0400 04/30/15 0500 04/30/15 0600 04/30/15 0742  BP: 108/46 135/59 154/67   Pulse:    46  Temp: 98.2 F (36.8 C)   97.8 F (36.6 C)  TempSrc: Oral   Oral  Resp:      Height:      Weight:      SpO2: 96% 96% 96%    General:  Well-appearing.  NAD. Lungs:  CTAB Heart:  RRR.  No m/r/g.  Nl S1/S2.  No JVD. Abdomen:  Soft, NT, ND.  +BS Extremities:  WWP.  No edema  LABS: Lab Results  Component Value Date   TROPONINI 16.55* 04/28/2015   Results for orders placed or performed during the hospital encounter of 04/27/15 (from the past 24 hour(s))  CBC     Status: Abnormal   Collection Time: 04/30/15  2:52 AM  Result Value Ref Range   WBC 11.8 (H) 4.0 - 10.5 K/uL   RBC 4.44 4.22 - 5.81 MIL/uL   Hemoglobin 13.5 13.0 - 17.0 g/dL   HCT 40.1 39.0 - 52.0 %   MCV 90.3 78.0 - 100.0 fL   MCH 30.4 26.0 - 34.0 pg   MCHC 33.7 30.0 - 36.0 g/dL   RDW 12.5 11.5 - 15.5 %   Platelets 232 150 - 400 K/uL  Basic metabolic panel     Status: Abnormal   Collection Time: 04/30/15  2:52 AM  Result Value Ref Range   Sodium 138 135 - 145 mmol/L   Potassium 3.8 3.5 - 5.1 mmol/L   Chloride 103 101 - 111 mmol/L   CO2 26 22 - 32 mmol/L   Glucose, Bld 119 (H) 65 - 99 mg/dL   BUN 10 6 - 20 mg/dL   Creatinine, Ser 0.94 0.61 - 1.24 mg/dL   Calcium 8.6 (L) 8.9 - 10.3 mg/dL   GFR calc non Af Amer >60 >60 mL/min   GFR calc Af Amer >60 >60 mL/min   Anion gap 9 5 - 15  Magnesium     Status: None   Collection Time: 04/30/15  2:52 AM  Result Value Ref Range   Magnesium 1.8 1.7 - 2.4 mg/dL    Intake/Output Summary (Last 24 hours) at 04/30/15  0827 Last data filed at 04/30/15 0600  Gross per 24 hour  Intake 1195.83 ml  Output   1700 ml  Net -504.17 ml    EKG: Sinus bradycardia rate 46 bpm.  Inferior infarct.  LVH with repolarization abnormalities.  Telemetry: bradycardia to the 40s.   Occasional PVCs.  LHC 04/27/15:  LIMA .  The LIMA sequenced to the first diagonal and mid LAD. The graft is widely patent with no obstruction.  SVG .  The saphenous vein graft to first OM is widely patent. The OM target is a large caliber vessel that is totally occluded before the graft insertion, but the native vessel is widely patent beyond the graft insertion site.  Ost 1st Mrg to 1st Mrg lesion, 100% stenosed.  SVG .  The saphenous vein graft, presumably to distal RCA, 100% occluded at the aortic anastomosis  Origin lesion, 100% stenosed.  LM lesion, 80% stenosed.  Ost Cx lesion, 90% stenosed.  Prox LAD lesion, 90% stenosed.  Ost RCA lesion, 95% stenosed.  Prox RCA lesion, 90% stenosed.  Mid RCA lesion, 80% stenosed.  There is moderate to severe left ventricular systolic dysfunction.  ASSESSMENT AND PLAN:  1. VT: Admitted with sustained VT at 190 bpm requiring emergent DCCV.  Cath showed 3 vessel CAD not amenable to PCI.  He was started on amiodarone IV and was transitioned to PO amio on 10/31.  However, after consultation with EP, this was discontinued. - Echo pending  2. CAD, NSTEMI (trop 16.5):  Known history of CAD with CABG 21 years ago.  Cath as described above, no pci targets.  - medical therapy with ASA, atorva 40, lisinopril 20, lopressor 25 bid, hep gtt - Received 48 hrs of heparin for medical management of NSTEMI  - Started plavix for medical management of NSTEMI. Plan for 1 year.  3. Chronic systolic HF: Echo 8264 LVEF 40-45%, moderate to severe dysfunction reported on cath by LV gram - repeat echo pending today. - Metoprolol switched to carvedilol. - Continue lisinopril  4. Sinus brady:  Asymptomatic.  Will get ICD today. Switched metoprolol to carvedilol, but he remains bradycardic.  5. Lactic acidosis: Resolved. - elevated at 4.2 on admit.  Improved to 1.8.  Dispo: Home today after echo.   Active Problems:   Ventricular tachycardia, sustained (HCC)   Ventricular tachycardia (HCC)   Sinus bradycardia   AV block, 1st degree   Ventricular tachyarrhythmia ([place])    Sharol Harness, MD 04/30/2015 8:27 AM

## 2015-04-30 NOTE — Discharge Instructions (Signed)
° ° °  Supplemental Discharge Instructions for  Pacemaker/Defibrillator Patients  Activity No heavy lifting or vigorous activity with your left/right arm for 6 to 8 weeks.  Do not raise your left/right arm above your head for one week.  Gradually raise your affected arm as drawn below.           __    05/04/15                      05/05/15                    05/06/15                   05/07/15  NO DRIVING for  6 months     WOUND CARE - Keep the wound area clean and dry.  Do not get this area wet for one week. No showers for one week; you may shower on   05/07/15  . - The tape/steri-strips on your wound will fall off; do not pull them off.  No bandage is needed on the site.  DO  NOT apply any creams, oils, or ointments to the wound area. - If you notice any drainage or discharge from the wound, any swelling or bruising at the site, or you develop a fever > 101? F after you are discharged home, call the office at once.  Special Instructions - You are still able to use cellular telephones; use the ear opposite the side where you have your pacemaker/defibrillator.  Avoid carrying your cellular phone near your device. - When traveling through airports, show security personnel your identification card to avoid being screened in the metal detectors.  Ask the security personnel to use the hand wand. - Avoid arc welding equipment, MRI testing (magnetic resonance imaging), TENS units (transcutaneous nerve stimulators).  Call the office for questions about other devices. - Avoid electrical appliances that are in poor condition or are not properly grounded. - Microwave ovens are safe to be near or to operate.  Additional information for defibrillator patients should your device go off: - If your device goes off ONCE and you feel fine afterward, notify the device clinic nurses. - If your device goes off ONCE and you do not feel well afterward, call 911. - If your device goes off TWICE, call 911. - If your  device goes off THREE times in one day, call 911.  DO NOT DRIVE YOURSELF OR A FAMILY MEMBER WITH A DEFIBRILLATOR TO THE HOSPITAL--CALL 911.

## 2015-04-30 NOTE — Progress Notes (Signed)
  Echocardiogram 2D Echocardiogram has been performed.  Jennette Dubin 04/30/2015, 10:45 AM

## 2015-04-30 NOTE — Telephone Encounter (Signed)
TCM Phone call .Victor Estes Appt is on 05/10/15 at 9:30am w/ Rosaria Ferries at [facility] .Victor Estes  Thanks

## 2015-04-30 NOTE — Progress Notes (Addendum)
Morning dose of Coreg was held, because HR of 46. Dr. Oval Linsey notified. Discharge instructions reviewed with patient. Patient educated about beta blocker, and slowly increasing activity. Patient instructed to call doctor if chest site becomes red, if he has fever, has chest pain or becomes short of breath.

## 2015-04-30 NOTE — Discharge Summary (Signed)
CARDIOLOGY DISCHARGE SUMMARY   Patient ID: Victor Estes MRN: [id number] DOB/AGE: 1946-04-15 69 y.o.  Admit date: 04/27/2015 Discharge date: 04/30/2015  PCP: Pcp Not In System Primary Cardiologist: Dr. Gwenlyn Found  Primary Discharge Diagnosis:  Ventricular tachycardia, sustained ([place])  Secondary Discharge Diagnosis: Sinus bradycardia, AV block, 1st degree, Ventricular tachyarrhythmia ([place])  Consults: Electrophysiology  Procedures: Direct Current Cardioversion, ICD Implantation, Transthoracic Echocardiogram, Left Heart Catheterization, Coronary Angiography  Hospital Course: Victor Estes is a 69 y.o. male with with past medical history of CAD (s/p CABG 1995) who presented to [facility] ED on 04/27/2015 for chest pain, dyspnea, and weakness that started earlier that morning after intercourse.   Upon EMS arrival, he was noted to be in wide-complex tachycardia with rates in the 190's and was transported to [facility] ED where he was successfully cardioverted and started on Amiodarone. While in the ED his EKG showed a possible acute inferoposterior STEMI, therefore he was taken emergently to the catheterization lab.  Cardiac Catheterization was performed and showed severe native disease with patent LIMA-LAD and SVG-OM. A likely chronically SVG-RCA was noted with no targets for PCI identified. An electrophysiology consult was recommended to consider ICD placement.  On the morning of 04/28/2015, the patient was maintaining NSR. After completing the 24 hour load of Amiodarone, he was switched to 400mg  PO BID. Due to his troponin elevations of 11.42, 18.55, and 16.55, he was continued on Heparin for 48 hours following his catheterization for medical management of his NSTEMI. Plavix was also initiated, in addition to the ASA, statin, ACE-I, and BB he was on previously.  His lactic acid had also been elevated to 4.2 on admission, but had improved to 1.8 on 04/28/2015. He was without  clinical symptoms at that time.  He remained without recurrence in chest pain, palpitations, or shortness of breath. After being seen by EP, he agreed to have ICD placement. The risks and benefits of the procedure were explained in detail and he agreed to proceed.  He underwent single chamber ICD implantation [facility], serial number: [id number]) on 04/29/2015. No complications were noted during or following the procedure.   On 04/30/2015, he was feeling well and denied any chest pain, palpitations, or complications concerning his recent ICD placement. His amiodarone was discontinued per EP recommendations. He will remain on low-dose Carvedilol at 6.25mg  BID. An echocardiogram will be obtained prior to discharge to reassess cardiac function as he was noted to have moderate to severe dysfunction by LV gram on his cath. His previous EF was 40-45% by echo in 2014.   Vitals and lab results were thoroughly reviewed. The patient was last examined by Dr. Oval Linsey and deemed stable for discharge. He will have a device check with EP on 05/08/2015 and a TCM appointment with Rosaria Ferries, PA-C on 05/10/2015.  Labs:   Lab Results  Component Value Date   WBC 11.8* 04/30/2015   HGB 13.5 04/30/2015   HCT 40.1 04/30/2015   MCV 90.3 04/30/2015   PLT 232 04/30/2015     Recent Labs Lab 04/30/15 0252  NA 138  K 3.8  CL 103  CO2 26  BUN 10  CREATININE 0.94  CALCIUM 8.6*  GLUCOSE 119*    Recent Labs  04/27/15 1640 04/27/15 1925 04/28/15 0030  TROPONINI 11.42* 18.55* 16.55*   Lipid Panel   Recent Labs  04/27/15 1202  INR 1.06      Radiology:  Dg Chest 2 View: 04/30/2015  CLINICAL DATA:  Cough. EXAM: CHEST  2 VIEW COMPARISON:  04/27/2015. FINDINGS: Cardiac pacer noted with tip in the right ventricle. Prior median sternotomy and CABG. Cardiomegaly with normal pulmonary vascularity. No focal infiltrate. No pleural effusion or pneumothorax. No acute bony abnormality. IMPRESSION: 1. Interim  placement of cardiac pacer, its tip is in the right ventricle. Prior CABG. Cardiomegaly. No pulmonary venous congestion noted on today's exam. 2.  No acute pulmonary infiltrate or other focal abnormality. Electronically Signed   By: Marcello Moores  Register   On: 04/30/2015 07:30   Dg Chest Portable 1 View: 04/27/2015  CLINICAL DATA:  Possible cardiac arrest Hx of cancer, HTN, myocardial infarct. EXAM: PORTABLE CHEST - 1 VIEW COMPARISON:  07/26/2013 FINDINGS: Monitoring hardware overlies the patient. Previous median sternotomy. Heart size upper limits normal for technique. No confluent airspace infiltrate. Mild central pulmonary vascular congestion. No pneumothorax. No effusion. Visualized skeletal structures are unremarkable. IMPRESSION: 1. Borderline cardiomegaly with mild central pulmonary vascular congestion. Electronically Signed   By: Lucrezia Europe M.D.   On: 04/27/2015 12:36   Cardiac Catheterization: 04/27/2015  LIMA .  The LIMA sequenced to the first diagonal and mid LAD. The graft is widely patent with no obstruction.  SVG .  The saphenous vein graft to first OM is widely patent. The OM target is a large caliber vessel that is totally occluded before the graft insertion, but the native vessel is widely patent beyond the graft insertion site.  Ost 1st Mrg to 1st Mrg lesion, 100% stenosed.  SVG .  The saphenous vein graft, presumably to distal RCA, 100% occluded at the aortic anastomosis  Origin lesion, 100% stenosed.  LM lesion, 80% stenosed.  Ost Cx lesion, 90% stenosed.  Prox LAD lesion, 90% stenosed.  Ost RCA lesion, 95% stenosed.  Prox RCA lesion, 90% stenosed.  Mid RCA lesion, 80% stenosed.  There is moderate to severe left ventricular systolic dysfunction.  1. Severe native multivessel coronary artery disease with severe left main stenosis, proximal LAD stenosis, proximal left circumflex stenosis, and severe diffuse RCA stenosis  2. Status post remote aortocoronary bypass  surgery with continued patency of the sequential LIMA graft to the first diagonal and LAD, and continued patency of the saphenous vein graft obtuse marginal.  3. Likely chronic occlusion of the saphenous vein graft RCA  4. Moderately severe LV segmental dysfunction consistent with this patient's known history of old inferior wall MI  I do not see any targets for PCI, nor do I see evidence of an acute plaque rupture or culprit vessel. I suspect this patient had scar mediated ventricular tachycardia. We'll keep him on IV amiodarone and place an EP consult for consideration of an ICD for secondary prevention of sudden cardiac death.  ICD Implantation: May 17, 2015 Procedure: single chamber ICD implantation without intraoperative defibrillation threshold testing  Following the obtaining of informed consent the patient was brought to the electrophysiology laboratory in place of the fluoroscopic table in the supine position. After routine prep and drape, lidocaine was infiltrated in the prepectoral subclavicular region and an incision was made and carried down to the layer of the prepectoral fascia using electrocautery and sharp dissection. A pocket was formed similarly.  Thereafter an attention was turned to gain access to the extrathoracic left subclavian vein which was accomplished without difficulty and without the aspiration of air or puncture of the artery. A 9 French sheath was placed for which was then passed a [employer] single coil active fixation defibrillator lead, model 181 serial number [id number]. It  was passed under fluoroscopic guidance to the right ventricular apex. In its location the bipolar R wave was 9 millivolts, impedance was 670 ohms, the pacing threshold was 1.1 volts at 0.5 msec. Current at threshold was 1.4 mA. There was no diaphragmatic pacing at 10 V. The current of injury was brisk .  The lead was secured to the prepectoral fascia and then attached to a [place],  serial number [id number]. Through the device, the bipolar R wave was 8.4 millivolts, impedance was 690 ohms, the pacing threshold was 0.5 volts at 0.5 msec. High-voltage impedance was 89 ohms.  The pocket was copiously irrigated with antibiotic containing saline solution. Hemostasis was assured, and the device and the lead was placed in the pocket and secured to the prepectoral fascia.  The wound was closed in 3 layers in normal fashion. The wound was washed dried and a DERMABOND was then applied. Needle counts sponge counts and instrument counts were correct at the end of the procedure according to the staff.  Patient tolerated the procedure without apparent complication  Echo: 69/09/8544 Study Conclusions - Left ventricle: There is basal and mid inferior and inferolateral hypokinesis. The cavity size was normal. Systolic function was mildly to moderately reduced. The estimated ejection fraction was in the range of 40% to 45%. Wall motion was normal; there were no regional wall motion abnormalities. Features are consistent with a pseudonormal left ventricular filling pattern, with concomitant abnormal relaxation and increased filling pressure (grade 2 diastolic dysfunction). - Aortic valve: There was mild regurgitation. - Aortic root: The aortic root was normal in size. - Mitral valve: Structurally normal valve. There was mild regurgitation. - Left atrium: The atrium was mildly dilated. - Right ventricle: Pacer wire or catheter noted in right ventricle. - Right atrium: Pacer wire or catheter noted in right atrium. - Tricuspid valve: There was mild regurgitation. - Inferior vena cava: The vessel was normal in size. - Pericardium, extracardiac: There was no pericardial effusion.  FOLLOW UP PLANS AND APPOINTMENTS Allergies  Allergen Reactions  . Clarithromycin Swelling     Medication List    STOP taking these medications        metoprolol tartrate 25 MG tablet    Commonly known as:  LOPRESSOR     simvastatin 80 MG tablet  Commonly known as:  ZOCOR  Replaced by:  atorvastatin 40 MG tablet      TAKE these medications        aspirin 81 MG EC tablet  Take 1 tablet (81 mg total) by mouth daily with breakfast.     atorvastatin 40 MG tablet  Commonly known as:  LIPITOR  Take 1 tablet (40 mg total) by mouth daily at 6 PM.     carvedilol 6.25 MG tablet  Commonly known as:  COREG  Take 1 tablet (6.25 mg total) by mouth 2 (two) times daily with a meal.     Cholecalciferol 2000 UNITS Tabs  Take 2,000 Units by mouth daily.     clopidogrel 75 MG tablet  Commonly known as:  PLAVIX  Take 1 tablet (75 mg total) by mouth daily.     ezetimibe 10 MG tablet  Commonly known as:  ZETIA  Take 1 tablet (10 mg total) by mouth daily.     lisinopril 20 MG tablet  Commonly known as:  PRINIVIL,ZESTRIL  Take 1 tablet (20 mg total) by mouth every morning.     methocarbamol 500 MG tablet  Commonly known as:  ROBAXIN  Take 1 tablet (500 mg total) by mouth every 6 (six) hours as needed for muscle spasms.     oxyCODONE-acetaminophen 5-325 MG tablet  Commonly known as:  PERCOCET/ROXICET  Take 1-2 tablets by mouth every 4 (four) hours as needed for severe pain.     pantoprazole 40 MG tablet  Commonly known as:  PROTONIX  Take 40 mg by mouth 2 (two) times daily.     tamsulosin 0.4 MG Caps capsule  Commonly known as:  FLOMAX  Take 0.4 mg by mouth daily after breakfast.     traMADol 50 MG tablet  Commonly known as:  ULTRAM  Take 50 mg by mouth every 6 (six) hours as needed for moderate pain or severe pain.         Follow-up Information    Follow up with [facility] On 05/08/2015.   Specialty:  Cardiology   Why:  at 10:30AM for wound check ([place])   Contact information:   [address], [place]      Follow up with [facility] On 05/10/2015.    Specialty:  Cardiology   Why:  Cardiology Hospital Follow-Up with Rosaria Ferries, PA-C on 05/10/2015 at 9:30 AM. ([place])   Contact information:   [place] [phone]      BRING ALL MEDICATIONS WITH YOU TO FOLLOW UP APPOINTMENTS  Time spent with patient to include physician time: 45 minutes Signed: Erma Heritage, PA 04/30/2015, 11:51 AM Co-Sign MD

## 2015-05-01 ENCOUNTER — Inpatient Hospital Stay (HOSPITAL_COMMUNITY): Admission: RE | Admit: 2015-05-01 | Payer: Medicare Other | Source: Ambulatory Visit

## 2015-05-01 NOTE — Telephone Encounter (Signed)
Patient contacted regarding discharge from [facility] on 04/30/2015.  Patient understands to follow up with provider Rosaria Ferries on 05/10/2015 at 09:30 at [facility]. Patient understands discharge instructions? Yes Patient understands medications and regiment? Yes Patient understands to bring all medications to this visit? Yes

## 2015-05-08 ENCOUNTER — Encounter: Payer: Self-pay | Admitting: Internal Medicine

## 2015-05-08 ENCOUNTER — Ambulatory Visit (INDEPENDENT_AMBULATORY_CARE_PROVIDER_SITE_OTHER): Payer: Medicare Other | Admitting: *Deleted

## 2015-05-08 DIAGNOSIS — I472 Ventricular tachycardia, unspecified: Secondary | ICD-10-CM

## 2015-05-08 DIAGNOSIS — R001 Bradycardia, unspecified: Secondary | ICD-10-CM

## 2015-05-08 LAB — CUP PACEART INCLINIC DEVICE CHECK
Battery Remaining Longevity: 106.8
Brady Statistic RV Percent Paced: 2.1 %
HighPow Impedance: 57.375
HighPow Impedance: 59 Ohm
Implantable Lead Implant Date: 20161031
Implantable Lead Location: 753860
Implantable Lead Model: 181
Implantable Lead Serial Number: 333140
Lead Channel Impedance Value: 412.5 Ohm
Lead Channel Pacing Threshold Pulse Width: 0.5 ms
Lead Channel Pacing Threshold Pulse Width: 0.5 ms
Lead Channel Setting Pacing Amplitude: 3.5 V
Lead Channel Setting Pacing Pulse Width: 0.5 ms
MDC IDC MSMT LEADCHNL RV PACING THRESHOLD AMPLITUDE: 0.75 V
MDC IDC MSMT LEADCHNL RV PACING THRESHOLD AMPLITUDE: 0.75 V
MDC IDC MSMT LEADCHNL RV SENSING INTR AMPL: 11.7 mV
MDC IDC PG SERIAL: 7308468
MDC IDC SESS DTM: 20161109112337
MDC IDC SET LEADCHNL RV SENSING SENSITIVITY: 0.5 mV

## 2015-05-08 NOTE — Progress Notes (Signed)
Wound check appointment. Dermabond mostly removed by pt prior to arrival, additional removal wipe & instructions given to pt. Wound without redness or edema. Incision edges approximated, wound well healed. Normal device function. Threshold, sensing, and impedances consistent with implant measurements. Device programmed at 3.5V for extra safety margin until 3 month visit. Histogram distribution appropriate for patient and level of activity. No ventricular arrhythmias noted. Patient educated about wound care, arm mobility, lifting restrictions, shock plan. ROV w/ SK 08/06/15. Pt normally bradycardic.

## 2015-05-09 ENCOUNTER — Ambulatory Visit (HOSPITAL_COMMUNITY)
Admit: 2015-05-09 | Discharge: 2015-05-09 | Disposition: A | Discharge status: Home / Self Care | Payer: Medicare Other | Source: Ambulatory Visit | Attending: Cardiology | Admitting: Cardiology

## 2015-05-09 DIAGNOSIS — I6523 Occlusion and stenosis of bilateral carotid arteries: Secondary | ICD-10-CM | POA: Diagnosis not present

## 2015-05-10 ENCOUNTER — Ambulatory Visit (INDEPENDENT_AMBULATORY_CARE_PROVIDER_SITE_OTHER): Payer: Medicare Other | Admitting: Physician Assistant

## 2015-05-10 ENCOUNTER — Encounter: Payer: Self-pay | Admitting: Physician Assistant

## 2015-05-10 VITALS — BP 132/74 | HR 42 | Ht 68.0 in | Wt 192.0 lb

## 2015-05-10 DIAGNOSIS — E785 Hyperlipidemia, unspecified: Secondary | ICD-10-CM

## 2015-05-10 DIAGNOSIS — I214 Non-ST elevation (NSTEMI) myocardial infarction: Secondary | ICD-10-CM

## 2015-05-10 DIAGNOSIS — I255 Ischemic cardiomyopathy: Secondary | ICD-10-CM

## 2015-05-10 DIAGNOSIS — I25810 Atherosclerosis of coronary artery bypass graft(s) without angina pectoris: Secondary | ICD-10-CM | POA: Diagnosis not present

## 2015-05-10 DIAGNOSIS — I10 Essential (primary) hypertension: Secondary | ICD-10-CM | POA: Diagnosis not present

## 2015-05-10 MED ORDER — ATORVASTATIN CALCIUM 40 MG PO TABS: 40.0000 mg | ORAL_TABLET | Freq: Every day | ORAL | Status: DC

## 2015-05-10 NOTE — Patient Instructions (Addendum)
Your physician recommends that you schedule a follow-up appointment in: 3 months with Dr. Gwenlyn Found.   You have been referred to [facility] cardiac rehab.

## 2015-05-10 NOTE — Progress Notes (Signed)
Cardiology Office Note   Date:  05/10/2015   ID:  Victor Estes, DOB 1945-11-25, MRN [id number]  PCP:  Pcp Not In System  Cardiologist:  Dr Gwenlyn Found EP: Dr  Brayton Mars, PA-C   Chief complaint: Post hospital follow-up  History of Present Illness: Victor Estes is a 69 y.o. male with a history of CABG '95, HTN, HL, GERD, Carotid dz. Recent admit w/ NSTEMI (med rx for occluded SVG-RCA), VT, Sinus brady, s/p St JudeICD, serial number: [id number], EF 40-45% by echo w/ grade 2 dd, d/c 11/01.  Victor Citron Pittinger presents for post hospital follow-up  Victor Estes has done very well since discharge. He has had no palpitations. Shortness of breath is greatly improved. He has had no chest pain. He is increasing his activity gradually and states he will go back to work in about a month as was told him by the doctor during his hospitalization.  He denies lower extremity edema, orthopnea or PND.  His heart rate is in the 40s today, and the patient notes that it has been in the 40s previously. He asked Dr. Gwenlyn Found about this who stated that heart rate was fine. The patient is asymptomatic with this. He is having trouble taking the Lipitor at exactly 6 PM and wonders if taking it at 6 PM as necessary.   Past Medical History  Diagnosis Date  . Myocardial infarct ([place])     CABG-1995- LIMA-D1-LAD, SVG-OM, SVG-RCA   . Cancer ([place])   . GERD (gastroesophageal reflux disease)   . Hyperlipemia   . Carotid artery disease ([place])   . Hypertension     dr berry  . Ventricular tachycardia, sustained ([place]) 04/27/2015    Past Surgical History  Procedure Laterality Date  . Cardiac surgery    . Colon surgery    . Elbow surgery    . Lumbar laminectomy/decompression microdiscectomy  09/09/2011    Procedure: LUMBAR LAMINECTOMY/DECOMPRESSION MICRODISCECTOMY 1 LEVEL;  Surgeon: Eustace Moore, MD;  Location: [place] [facility]UR[facility] OR[facility];  Service: Neurosurgery;  Laterality: Right;  Right Lumbar Five-Sacral One  Hemilaminectomy   . Back surgery    . Cholecystectomy    . Coronary artery bypass graft      x4    dr Redmond [place]  . Total knee arthroplasty Left 07/26/2013    Procedure: TOTAL KNEE ARTHROPLASTY;  Surgeon: Ninetta Lights, MD;  Location: [facility];  Service: Orthopedics;  Laterality: Left;  . Cardiac catheterization N/A 04/27/2015    Procedure: Left Heart Cath and Coronary Angiography;  Surgeon: Sherren Mocha, MD;  Location: [place] CV LAB;  Service: Cardiovascular;  Laterality: N/A;  . Ep implantable device N/A 04/29/2015    Procedure: ICD Implant;  Surgeon: Deboraha Sprang, MD;  Location: [place] CV LAB;  Service: Cardiovascular;  Laterality: N/A; St Jude ICD, serial number [id number].    Current Outpatient Prescriptions  Medication Sig Dispense Refill  . aspirin EC 81 MG EC tablet Take 1 tablet (81 mg total) by mouth daily with breakfast.    . atorvastatin (LIPITOR) 40 MG tablet Take 1 tablet (40 mg total) by mouth daily at 6 PM. 30 tablet 6  . carvedilol (COREG) 6.25 MG tablet Take 1 tablet (6.25 mg total) by mouth 2 (two) times daily with a meal. 60 tablet 6  . Cholecalciferol 2000 UNITS TABS Take 2,000 Units by mouth daily.    . clopidogrel (PLAVIX) 75 MG tablet Take 1 tablet (75 mg total) by mouth daily. [place]  tablet 3  . ezetimibe (ZETIA) 10 MG tablet Take 1 tablet (10 mg total) by mouth daily. 90 tablet 3  . lisinopril (PRINIVIL,ZESTRIL) 20 MG tablet Take 1 tablet (20 mg total) by mouth every morning. 30 tablet 6  . methocarbamol (ROBAXIN) 500 MG tablet Take 1 tablet (500 mg total) by mouth every 6 (six) hours as needed for muscle spasms. 90 tablet 1  . oxyCODONE-acetaminophen (PERCOCET/ROXICET) 5-325 MG per tablet Take 1-2 tablets by mouth every 4 (four) hours as needed for severe pain. 30 tablet 0  . pantoprazole (PROTONIX) 40 MG tablet Take 40 mg by mouth 2 (two) times daily.     . tamsulosin (FLOMAX) 0.4 MG CAPS capsule Take 0.4 mg by mouth daily after breakfast.    . traMADol  (ULTRAM) 50 MG tablet Take 50 mg by mouth every 6 (six) hours as needed for moderate pain or severe pain.     No current facility-administered medications for this visit.    Allergies:   Clarithromycin    Social History:  The patient  reports that he quit smoking about 32 years ago. He has never used smokeless tobacco. He reports that he does not drink alcohol or use illicit drugs.   Family History:  The patient's family history includes Heart failure in his mother; Hypertension in his mother and other.    ROS:  Please see the history of present illness. All other systems are reviewed and negative.    PHYSICAL EXAM: VS:  BP 132/74 mmHg  Pulse 42  Ht 5\' 8"  (1.727 m)  Wt 192 lb (87.091 kg)  BMI 29.20 kg/m2  SpO2 97% , BMI Body mass index is 29.2 kg/(m^2). GEN: Well nourished, well developed, male in no acute distress HEENT: normal for age  Neck: no JVD, no carotid bruit, no masses Cardiac: RRR; no murmur, no rubs, or gallops Respiratory:  clear to auscultation bilaterally, normal work of breathing GI: soft, nontender, nondistended, + BS MS: no deformity or atrophy; no edema; distal pulses are 2+ in all 4 extremities  Skin: warm and dry, no rash Neuro:  Strength and sensation are intact Psych: euthymic mood, full affect   EKG:  EKG is not ordered today. Device check on 11/09 showed no ventricular arrhythmias, he was sinus bradycardia with ventricular sensing, heart rate in the 40s  Cardiac cath: 04/27/2015  LIMA sequenced to the first diagonal and mid LAD. The graft is widely patent with no obstruction.  SVG to first OM is widely patent. The OM target is a large caliber vessel that is totally occluded before the graft insertion, but the native vessel is widely patent beyond the graft insertion   Ost 1st Mrg to 1st Mrg lesion, 100% stenosed.  SVG, presumably to distal RCA, 100% occluded at the aortic anastomosis  Origin lesion, 100% stenosed.  LM lesion, 80%  stenosed.  Ost Cx lesion, 90% stenosed.  Prox LAD lesion, 90% stenosed.  Ost RCA lesion, 95% stenosed.  Prox RCA lesion, 90% stenosed.  Mid RCA lesion, 80% stenosed.  There is moderate to severe left ventricular systolic dysfunction.  1. Severe native multivessel coronary artery disease with severe left main stenosis, proximal LAD stenosis, proximal left circumflex stenosis, and severe diffuse RCA stenosis 2. Status post remote aortocoronary bypass surgery with continued patency of the sequential LIMA graft to the first diagonal and LAD, and continued patency of the saphenous vein graft obtuse marginal. 3. Likely chronic occlusion of the saphenous vein graft RCA 4. Moderately severe LV  segmental dysfunction consistent with this patient's known history of old inferior wall MI       I do not see any targets for PCI, nor do I see evidence of an acute plaque rupture or culprit vessel. I suspect this patient had scar mediated ventricular tachycardia. We'll keep him on IV amiodarone and place an EP consult for consideration of an ICD for secondary prevention of sudden cardiac death.  Recent Labs: 05-28-2015: BUN 10; Creatinine, Ser 0.94; Hemoglobin 13.5; Magnesium 1.8; Platelets 232; Potassium 3.8; Sodium 138    Lipid Panel No results found for: CHOL, TRIG, HDL, CHOLHDL, VLDL, LDLCALC, LDLDIRECT   Wt Readings from Last 3 Encounters:  05/10/15 192 lb (87.091 kg)  04/27/15 189 lb 9.5 oz (86 kg)  04/17/15 193 lb (87.544 kg)     Other studies Reviewed: Additional studies/ records that were reviewed today include: Hospital records, cath and echo report, office notes.  ASSESSMENT AND PLAN:  1.  Non-STEMI: He had a non-STEMI secondary to the ventricular tachycardia as the occluded SVG-RCA seen at cath was old. Other grafts were patent with severe native three-vessel disease. This was explained to the patient and his wife. They indicate understanding. He is encouraged to attend cardiac  rehabilitation, to make sure that he can increase his activities safely. We will make the referral.  Continue aspirin, Plavix, statin, beta blocker and ACE inhibitor  2. Hyperlipidemia: He is to continue the statin and Zetia, but we will change the medication to indicate he can take it at supper or at bedtime, taking it exactly at 6 PM is not required  3. Ischemic cardiomyopathy: He is on a beta blocker and an ACE inhibitor and his blood pressure is well controlled  4. Bradycardia: His ICD is functioning well at recent check. His wound is healing well. He is asymptomatic with a heart rate in the 40s and we will not reduce the beta blocker dose at this time. If he gets lightheaded or begins having symptoms,  5. Ventricular tachycardia: He has had no palpitations. No ventricular arrhythmias were seen on recent ICD interrogation. Keep scheduled follow-up with Dr. Caryl Comes   Current medicines are reviewed at length with the patient today.  The patient has concerns regarding medicines. CONCERNS were addressed  The following changes have been made:  Change dose timing on Lipitor  Labs/ tests ordered today include:   Orders Placed This Encounter  Procedures  . AMB referral to cardiac rehabilitation     Disposition:   FU with Dr. Gwenlyn Found and Dr. Caryl Comes as scheduled  Signed, Lenoard Aden  05/10/2015 9:57 AM    [address], [place], [place]  [address] Phone: [phone]; Fax: [phone]

## 2015-05-16 ENCOUNTER — Telehealth: Payer: Self-pay | Admitting: Cardiovascular Disease

## 2015-05-16 ENCOUNTER — Encounter: Payer: Self-pay | Admitting: Cardiovascular Disease

## 2015-05-16 NOTE — Telephone Encounter (Signed)
Pt aware of carotid results.  

## 2015-05-16 NOTE — Telephone Encounter (Signed)
Returning your call. °

## 2015-06-06 ENCOUNTER — Encounter (HOSPITAL_COMMUNITY)
Admission: RE | Admit: 2015-06-06 | Discharge: 2015-06-06 | Disposition: A | Discharge status: Home / Self Care | Payer: Medicare Other | Source: Ambulatory Visit | Attending: Cardiovascular Disease | Admitting: Cardiovascular Disease

## 2015-06-06 DIAGNOSIS — I25810 Atherosclerosis of coronary artery bypass graft(s) without angina pectoris: Secondary | ICD-10-CM | POA: Insufficient documentation

## 2015-06-06 NOTE — Progress Notes (Signed)
Cardiac Rehab Medication Review by a Pharmacist  Does the patient  feel that his/her medications are working for him/her?  yes  Has the patient been experiencing any side effects to the medications prescribed?  no  Does the patient measure his/her own blood pressure or blood glucose at home?  yes   Does the patient have any problems obtaining medications due to transportation or finances?   no  Understanding of regimen: good Understanding of indications: good Potential of compliance: good    Pharmacist comments:  [name] is a pleasant 69 yo M presenting with his wife and his recent discharge medication list. He reports good compliance with his medications but states that he was only taking 1/2 of his Plavix every day d/t the size of the pill. I advised him to take the entire tablet even if he needed to take the two halves separately. We reviewed in detail the indications for each of his medications and the importance of continued use and he verbalized understanding.   Victor Estes, Victor Estes, BCPS, CPP Clinical Pharmacist Pager: [phone] Phone: [phone] 06/06/2015 9:00 AM

## 2015-06-10 ENCOUNTER — Encounter (HOSPITAL_COMMUNITY)
Admission: RE | Admit: 2015-06-10 | Discharge: 2015-06-10 | Disposition: A | Discharge status: Home / Self Care | Payer: Medicare Other | Source: Ambulatory Visit | Attending: Cardiovascular Disease | Admitting: Cardiovascular Disease

## 2015-06-10 DIAGNOSIS — I25810 Atherosclerosis of coronary artery bypass graft(s) without angina pectoris: Secondary | ICD-10-CM | POA: Diagnosis not present

## 2015-06-10 NOTE — Progress Notes (Signed)
Pt started exercise today in the Phase II cardiac rehab program.  Pt tolerated light exercise without difficulty. VSS, telemetry-SR first degree av block, this is present on his most recent 12 lead ekg 04/30/15. Pt asymptomatic.  Medication list reconciled.  Pt verbalized compliance with medications and denies barriers to compliance. PSYCHOSOCIAL ASSESSMENT:  PHQ-0. Pt exhibits positive coping skills, hopeful outlook with supportive family which includes his wife, children and brothers. No psychosocial needs identified at this time, no psychosocial interventions necessary.    Pt enjoys playing golf.   Pt cardiac rehab  Goal for long and short term is know he is sage to get back to normal.  Pt desires to learn his stop signs and boundaries for exercise.   Pt encouraged to participate in education classes and nutrition to increase his potential to reach this goal  Pt oriented to exercise equipment and routine.  Understanding verbalized. Cherre Huger, BSN

## 2015-06-12 ENCOUNTER — Encounter (HOSPITAL_COMMUNITY)
Admission: RE | Admit: 2015-06-12 | Discharge: 2015-06-12 | Disposition: A | Discharge status: Home / Self Care | Payer: Medicare Other | Source: Ambulatory Visit | Attending: Cardiovascular Disease | Admitting: Cardiovascular Disease

## 2015-06-12 DIAGNOSIS — I25810 Atherosclerosis of coronary artery bypass graft(s) without angina pectoris: Secondary | ICD-10-CM | POA: Diagnosis not present

## 2015-06-12 NOTE — Progress Notes (Signed)
Pt with upcoming appt on Friday at the [facility] medical center.  Pt given copy of his rehab report for MD review.    QUALITY OF LIFE SCORE REVIEW  Pt completed Quality of Life survey as a participant in Cardiac Rehab. Scores 21.0 or below are considered low. Overall 25.28, Health and Function 25.73, socioeconomic 25.0, physiological and spiritual 23.79, family 26.0.  Pt scores are well above the threshold of 21.0  No needs identified, no further intervention is needed.  Offered emotional support and reassurance.  Will continue to monitor and intervene as necessary. Cherre Huger, BSN

## 2015-06-14 ENCOUNTER — Encounter (HOSPITAL_COMMUNITY): Payer: Medicare Other

## 2015-06-17 ENCOUNTER — Encounter (HOSPITAL_COMMUNITY)
Admission: RE | Admit: 2015-06-17 | Discharge: 2015-06-17 | Disposition: A | Discharge status: Home / Self Care | Payer: Medicare Other | Source: Ambulatory Visit | Attending: Cardiovascular Disease | Admitting: Cardiovascular Disease

## 2015-06-17 DIAGNOSIS — I25810 Atherosclerosis of coronary artery bypass graft(s) without angina pectoris: Secondary | ICD-10-CM | POA: Diagnosis not present

## 2015-06-17 NOTE — Progress Notes (Signed)
Reviewed home exercise with pt today.  Pt plans to walk at home for exercise.  Reviewed THR, pulse, RPE, sign and symptoms, and when to call 911 or MD.  Pt voiced understanding. Garnett Rekowski, MA, ACSM RCEP   

## 2015-06-19 ENCOUNTER — Encounter (HOSPITAL_COMMUNITY)
Admission: RE | Admit: 2015-06-19 | Discharge: 2015-06-19 | Disposition: A | Discharge status: Home / Self Care | Payer: Medicare Other | Source: Ambulatory Visit | Attending: Cardiovascular Disease | Admitting: Cardiovascular Disease

## 2015-06-19 DIAGNOSIS — I25810 Atherosclerosis of coronary artery bypass graft(s) without angina pectoris: Secondary | ICD-10-CM | POA: Diagnosis not present

## 2015-06-21 ENCOUNTER — Encounter (HOSPITAL_COMMUNITY)
Admission: RE | Admit: 2015-06-21 | Discharge: 2015-06-21 | Disposition: A | Discharge status: Home / Self Care | Payer: Medicare Other | Source: Ambulatory Visit | Attending: Cardiovascular Disease | Admitting: Cardiovascular Disease

## 2015-06-21 DIAGNOSIS — I25810 Atherosclerosis of coronary artery bypass graft(s) without angina pectoris: Secondary | ICD-10-CM | POA: Diagnosis not present

## 2015-06-26 ENCOUNTER — Encounter (HOSPITAL_COMMUNITY)
Admission: RE | Admit: 2015-06-26 | Discharge: 2015-06-26 | Disposition: A | Discharge status: Home / Self Care | Payer: Medicare Other | Source: Ambulatory Visit | Attending: Cardiovascular Disease | Admitting: Cardiovascular Disease

## 2015-06-26 DIAGNOSIS — I25810 Atherosclerosis of coronary artery bypass graft(s) without angina pectoris: Secondary | ICD-10-CM | POA: Diagnosis not present

## 2015-06-28 ENCOUNTER — Encounter (HOSPITAL_COMMUNITY)
Admission: RE | Admit: 2015-06-28 | Discharge: 2015-06-28 | Disposition: A | Discharge status: Home / Self Care | Payer: Medicare Other | Source: Ambulatory Visit | Attending: Cardiovascular Disease | Admitting: Cardiovascular Disease

## 2015-06-28 DIAGNOSIS — I25810 Atherosclerosis of coronary artery bypass graft(s) without angina pectoris: Secondary | ICD-10-CM | POA: Diagnosis not present

## 2015-06-28 NOTE — Progress Notes (Signed)
Hr pre exercise 47.  Pt asymptomatic.  Pt alerted rehab staff that he self cut his lisinopril to half a tablet due to complaints of feeling dizzy. Pt cut this back two days ago.  Looking back bp higher on Wednesday and today 140-150's compared to 110 to 120's. Pt presently low 50's heart rate with no symptoms.  Pt will see Dr. Gwenlyn Found in February.  Will let Dr. Gwenlyn Found know that pt has cut his dose back.  Strips and rehab report sent over for review. Cherre Huger, BSN

## 2015-07-03 ENCOUNTER — Encounter: Payer: Self-pay | Admitting: Internal Medicine

## 2015-07-03 ENCOUNTER — Encounter (HOSPITAL_COMMUNITY)
Admission: RE | Admit: 2015-07-03 | Discharge: 2015-07-03 | Disposition: A | Discharge status: Home / Self Care | Payer: Medicare Other | Source: Ambulatory Visit | Attending: Cardiovascular Disease | Admitting: Cardiovascular Disease

## 2015-07-03 ENCOUNTER — Ambulatory Visit (INDEPENDENT_AMBULATORY_CARE_PROVIDER_SITE_OTHER): Payer: Medicare Other | Admitting: *Deleted

## 2015-07-03 DIAGNOSIS — I25810 Atherosclerosis of coronary artery bypass graft(s) without angina pectoris: Secondary | ICD-10-CM | POA: Insufficient documentation

## 2015-07-03 DIAGNOSIS — I255 Ischemic cardiomyopathy: Secondary | ICD-10-CM

## 2015-07-03 DIAGNOSIS — R001 Bradycardia, unspecified: Secondary | ICD-10-CM

## 2015-07-03 DIAGNOSIS — I472 Ventricular tachycardia, unspecified: Secondary | ICD-10-CM

## 2015-07-03 NOTE — Progress Notes (Signed)
Pt in for rehab today.  Pt brought to the attention on rehab staff the fact that his device had "slipped".  Incision remain intact and no swelling or warmth.  Pt complains that it is a little "sore".  Device observed to move freely outside the pocket and bulge is felt above his nipple line.  Pt device placed on 10/31 by Dr. Caryl Comes.  Device clinic called. Advised device clinic of pt complaint.  Pt can be seen today while Caryl Comes is in the office.  Pt appt is at [address] location.  Pt verbalizes understanding and is in agreement. Cherre Huger, BSN

## 2015-07-04 LAB — CUP PACEART INCLINIC DEVICE CHECK
Battery Remaining Longevity: 104.4
Brady Statistic RV Percent Paced: 2.7 %
Date Time Interrogation Session: 20170104205729
HIGH POWER IMPEDANCE MEASURED VALUE: 63 Ohm
Implantable Lead Implant Date: 20161031
Implantable Lead Model: 181
Implantable Lead Serial Number: 333140
Lead Channel Pacing Threshold Pulse Width: 0.5 ms
Lead Channel Sensing Intrinsic Amplitude: 11.7 mV
Lead Channel Setting Pacing Pulse Width: 0.5 ms
Lead Channel Setting Sensing Sensitivity: 0.5 mV
MDC IDC LEAD LOCATION: 753860
MDC IDC MSMT LEADCHNL RV IMPEDANCE VALUE: 400 Ohm
MDC IDC MSMT LEADCHNL RV PACING THRESHOLD AMPLITUDE: 0.75 V
MDC IDC MSMT LEADCHNL RV PACING THRESHOLD AMPLITUDE: 0.75 V
MDC IDC MSMT LEADCHNL RV PACING THRESHOLD PULSEWIDTH: 0.5 ms
MDC IDC PG SERIAL: 7308468
MDC IDC SET LEADCHNL RV PACING AMPLITUDE: 3.5 V

## 2015-07-04 NOTE — Progress Notes (Signed)
ICD check in clinic for site eval per pt/cardiac rehab. Patient c/o ICD drooping more towards nipple line. Site assessed---incision edges approximated. Wound well healed without redness, edema, or ecchymosis. Dr. Caryl Comes stated that the change in the device position is anticipated--no further recommendations.   ICD interrogation shows normal device function. Threshold and sensing consistent with previous device measurements. Impedance trends stable over time. No evidence of any ventricular arrhythmias. Histogram distribution appropriate for patient and level of activity. No changes made this session. RV output remains at acute setting. Device programmed to optimize intrinsic conduction. Estimated longevity 6.9-8.7 years. Pt will follow up with [facility] as scheduled. Patient education completed including shock plan. Vibration demonstrated for patient.

## 2015-07-05 ENCOUNTER — Encounter (HOSPITAL_COMMUNITY)
Admission: RE | Admit: 2015-07-05 | Discharge: 2015-07-05 | Disposition: A | Discharge status: Home / Self Care | Payer: Medicare Other | Source: Ambulatory Visit | Attending: Cardiovascular Disease | Admitting: Cardiovascular Disease

## 2015-07-05 DIAGNOSIS — I25810 Atherosclerosis of coronary artery bypass graft(s) without angina pectoris: Secondary | ICD-10-CM | POA: Diagnosis not present

## 2015-07-08 ENCOUNTER — Encounter (HOSPITAL_COMMUNITY): Payer: Medicare Other

## 2015-07-10 ENCOUNTER — Encounter (HOSPITAL_COMMUNITY)
Admission: RE | Admit: 2015-07-10 | Discharge: 2015-07-10 | Disposition: A | Discharge status: Home / Self Care | Payer: Medicare Other | Source: Ambulatory Visit | Attending: Cardiovascular Disease | Admitting: Cardiovascular Disease

## 2015-07-10 DIAGNOSIS — I25810 Atherosclerosis of coronary artery bypass graft(s) without angina pectoris: Secondary | ICD-10-CM | POA: Diagnosis not present

## 2015-07-12 ENCOUNTER — Encounter: Payer: Self-pay | Admitting: Cardiovascular Disease

## 2015-07-12 ENCOUNTER — Encounter (HOSPITAL_COMMUNITY)
Admission: RE | Admit: 2015-07-12 | Discharge: 2015-07-12 | Disposition: A | Discharge status: Home / Self Care | Payer: Medicare Other | Source: Ambulatory Visit | Attending: Cardiovascular Disease | Admitting: Cardiovascular Disease

## 2015-07-12 DIAGNOSIS — I25810 Atherosclerosis of coronary artery bypass graft(s) without angina pectoris: Secondary | ICD-10-CM | POA: Diagnosis not present

## 2015-07-15 ENCOUNTER — Encounter (HOSPITAL_COMMUNITY)
Admission: RE | Admit: 2015-07-15 | Discharge: 2015-07-15 | Disposition: A | Discharge status: Home / Self Care | Payer: Medicare Other | Source: Ambulatory Visit | Attending: Cardiovascular Disease | Admitting: Cardiovascular Disease

## 2015-07-15 DIAGNOSIS — I25810 Atherosclerosis of coronary artery bypass graft(s) without angina pectoris: Secondary | ICD-10-CM | POA: Diagnosis not present

## 2015-07-15 NOTE — Progress Notes (Signed)
Griffith Citron Galgano 70 y.o. male Nutrition Note Spoke with pt.  Nutrition Survey reviewed with pt. Pt is not following the Therapeutic Lifestyle Changes diet. Per discussion, pt has not made any significant dietary changes. Pt currently eats out more often due to pt was in the middle of a whole kitchen remodel when his defibrillator "acted up." Pt wants to lose wt. Pt has not been actively trying to lose wt. Wt loss tips reviewed. Pt expressed understanding of the information reviewed. Pt aware o62f nutrition education classes offered. No results found for: HGBA1C Wt Readings from Last 3 Encounters:  06/06/15 198 lb 3.1 oz (89.9 kg)  05/10/15 192 lb (87.091 kg)  04/27/15 189 lb 9.5 oz (86 kg)   Nutrition Diagnosis ? Food-and nutrition-related knowledge deficit related to lack of exposure to information as related to diagnosis of: ? CVD ? Obesity related to excessive energy intake as evidenced by a BMI of 32.2 Nutrition Intervention ? Benefits of adopting Therapeutic Lifestyle Changes discussed when Medficts reviewed. ? Pt to attend the Portion Distortion class ? Pt given handouts for: ? Nutrition I class ? Nutrition II class ? Continue client-centered nutrition education by RD, as part of interdisciplinary care. Goal(s) ? Pt to make good choices when eating out at restaurants. ? Pt to identify and limit food sources of saturated fat, trans fat, and cholesterol ? Pt to identify food quantities necessary to achieve: ? wt loss to a goal wt loss of 6-24 lb (2.7-10.9 kg) at graduation from cardiac rehab.   Monitor and Evaluate progress toward nutrition goal with team.  Derek Mound, M.Ed, RD, LDN, CDE 07/15/2015 12:13 PM

## 2015-07-17 ENCOUNTER — Encounter (HOSPITAL_COMMUNITY)
Admission: RE | Admit: 2015-07-17 | Discharge: 2015-07-17 | Disposition: A | Discharge status: Home / Self Care | Payer: Medicare Other | Source: Ambulatory Visit | Attending: Cardiovascular Disease | Admitting: Cardiovascular Disease

## 2015-07-17 DIAGNOSIS — I25810 Atherosclerosis of coronary artery bypass graft(s) without angina pectoris: Secondary | ICD-10-CM | POA: Diagnosis not present

## 2015-07-19 ENCOUNTER — Encounter (HOSPITAL_COMMUNITY): Payer: Medicare Other

## 2015-07-22 ENCOUNTER — Encounter (HOSPITAL_COMMUNITY)
Admission: RE | Admit: 2015-07-22 | Discharge: 2015-07-22 | Disposition: A | Discharge status: Home / Self Care | Payer: Medicare Other | Source: Ambulatory Visit | Attending: Cardiovascular Disease | Admitting: Cardiovascular Disease

## 2015-07-22 DIAGNOSIS — I25810 Atherosclerosis of coronary artery bypass graft(s) without angina pectoris: Secondary | ICD-10-CM | POA: Diagnosis not present

## 2015-07-22 NOTE — Progress Notes (Signed)
Pt with elevated bp today exertional in nature 160's upper over 80's. Questioned pt regarding his medications.  Pt indicated that he had cut his lisinopril in half because he felt this was making him dizzy. Upon review of his medications pt is unsure of which one he cut in half.  Asked pt to bring his medication in for review on his next exercise session on Wednesday.  Cherre Huger, BSN

## 2015-07-24 ENCOUNTER — Telehealth: Payer: Self-pay | Admitting: Physician Assistant

## 2015-07-24 ENCOUNTER — Encounter (HOSPITAL_COMMUNITY)
Admission: RE | Admit: 2015-07-24 | Discharge: 2015-07-24 | Disposition: A | Discharge status: Home / Self Care | Payer: Medicare Other | Source: Ambulatory Visit | Attending: Cardiovascular Disease | Admitting: Cardiovascular Disease

## 2015-07-24 NOTE — Telephone Encounter (Signed)
Maria from cardiac rehab paged me. She reports patient's HR at cardiac rehab is in the mid 40's and BP at rest in the [id number] systolic. Apparently the patient is taking both carvedilol and metoprolol, although carvedilol is the only BB on our med list. The metoprolol came from the [place]. He is asymptomatic. Per review of chart, HR runs chronically in the 40s. (Was 42 at last OV, 46 on last EKG.) She inquired about whether patient could be seen for appt for med review. I agree this is appropriate but I am not in clinic this week. She asked if she should call the office and I said yes - I asked that she speak with triage about having him placed on APP/flex scheduled for further review and med adjustment. She will call them.  Jaydyn Bozzo PA-C

## 2015-07-24 NOTE — Telephone Encounter (Signed)
Appointment w/ Desiree Hane in AM

## 2015-07-24 NOTE — Progress Notes (Signed)
Patient brought his medications for review. Upon review of Victor Estes's medications it was revealedl that Victor Estes has been taking 75mg  of Metoprolol Tartrate twice a day that he received from the [place] and 6.25 of coreg twice a day. Patient instructed not to take the Metoprolol tartrate anymore. Heart rate noted in the 40's sinus with PVC's. Patient asymptomatic. Blood pressure 152/70. Victor Estes [facility] called and notified. Advised that patient be set up with the flex clinic to help with medication management. Victor Estes has an appointment to see Victor Estes [facility] tomorrow at 0800. Victor Estes did not exercise today. Exercise flow sheets and ECG tracing's sent to [facility] on [address] for [employer] [facility] to review. Victor Estes did not exercise today. Victor Estes will bring his medication bottle's to tomorrow's appointment.

## 2015-07-25 ENCOUNTER — Encounter: Payer: Self-pay | Admitting: Physician Assistant

## 2015-07-25 ENCOUNTER — Ambulatory Visit (INDEPENDENT_AMBULATORY_CARE_PROVIDER_SITE_OTHER): Payer: Medicare Other | Admitting: Physician Assistant

## 2015-07-25 VITALS — BP 146/74 | HR 54 | Ht 68.0 in | Wt 194.8 lb

## 2015-07-25 DIAGNOSIS — Z95810 Presence of automatic (implantable) cardiac defibrillator: Secondary | ICD-10-CM

## 2015-07-25 DIAGNOSIS — I10 Essential (primary) hypertension: Secondary | ICD-10-CM

## 2015-07-25 DIAGNOSIS — I255 Ischemic cardiomyopathy: Secondary | ICD-10-CM

## 2015-07-25 DIAGNOSIS — I472 Ventricular tachycardia, unspecified: Secondary | ICD-10-CM

## 2015-07-25 DIAGNOSIS — I25810 Atherosclerosis of coronary artery bypass graft(s) without angina pectoris: Secondary | ICD-10-CM | POA: Diagnosis not present

## 2015-07-25 DIAGNOSIS — R001 Bradycardia, unspecified: Secondary | ICD-10-CM | POA: Diagnosis not present

## 2015-07-25 MED ORDER — AMLODIPINE BESYLATE 5 MG PO TABS: 5.0000 mg | ORAL_TABLET | Freq: Every day | ORAL | Status: DC

## 2015-07-25 NOTE — Patient Instructions (Addendum)
Medication Instructions:  Your physician has recommended you make the following change in your medication:  1) STOP Metoprolol 2) START Amlodipine 5mg  daily  Labwork: None ordered  Testing/Procedures: None ordered  Follow-Up: Follow up as planned with Dr.Berry and Dr.Klein  Any Other Special Instructions Will Be Listed Below (If Applicable).     If you need a refill on your cardiac medications before your next appointment, please call your pharmacy.

## 2015-07-25 NOTE — Progress Notes (Signed)
Cardiology Office Note   Date:  07/25/2015   ID:  Jamail Lastrapes Kaatz, DOB Feb 04, 1946, MRN [id number]  PCP:  Garner Gavel, MD at [facility] hospital in [place] Cardiologist:  Dr. Gwenlyn Found EP: Dr. Lovena Le   Chief Complaint  Patient presents with  . Follow-up    seen for Dr. Gwenlyn Found      History of Present Illness: Victor Estes is a 70 y.o. male who presents for cardiology office followup. He was added onto flex clinic after his BB recently discontinued for bradycardia and medication review. He has a h/o CAD s/p CABG 1995 (seq LIMA to D1 and LAD, SVG to OM1, known occluded SVG to RCA), HTN, HLD, GERD, carotid artery disease, VT, sinus brady s/p ST Jude ICD, and chronic combined systolic and diastolic HF/ICM. His most recent admission was in October 2016 when he presented with sustained ventricular tachycardia and underwent emergent cardioversion. He was placed on IV amiodarone. EKG showed possible acute inferolateral posterior STEMI, he was taken emergently to the cath lab. Cardiac cath showed patent SVG to OM1, patent sequential LIMA to D1 and mid LAD, occluded SVG to distal RCA at the aortic anastomosis which is likely chronic, EF 35% on cath. There was no obvious culprit lesion or target for PCI. There was no evidence for acute plaque rupture. It was felt the patient likely has scar mediated ventricular tachycardia. Post cath trop peaked at 18.55. EP was consulted, he underwent single-chamber St Jude ICD implantation on 04/29/2015. Echocardiogram obtained on 04/30/2015 showed EF [id number], grade 1 diastolic dysfunction, mild AR/MR, moderate TR. His amiodarone was discontinued. EP recommendation. He was placed on low-dose carvedilol 6.25 twice a day. He was last seen in the office on 05/10/2015, he was bradycardic during that visit. His medication was continued given recent ventricular tachycardia and he was asymptomatic. He did have carotid ultrasound on 11/10 which showed 60-79% right ICA stenosis,  40-59% left ICA stenosis. Greater than 50% right subclavian stenosis. Recommend follow-up in one year. His last ICD interrogation was on 07/03/2015, this showed normal device function, threshold and sensing consistent with previous device measurement. No evidence of any ventricular arrhythmia. Estimated longevity 6.9- 8.7 years.  Cardiac rehabilitation called cardiology on-call service on 07/24/2015 for assessment of bradycardia during exercise. Apparently patient was taking both carvedilol and metoprolol. According to the patient, metoprolol has been prescribed by [facility] a long time ago. Coreg was recently added. He has been taking both. His HR has been in the 40s, however BP has been in 160-180s. He occasional has dizziness, but no feeling of passing out. Otherwise he has been doing well, denies any CP or SOB. His last dose of metoprolol was yesterday, he has since stopped metoprolol.     Past Medical History  Diagnosis Date  . Myocardial infarct ([place])     CABG-1995- LIMA-D1-LAD, SVG-OM, SVG-RCA   . Cancer ([place])   . GERD (gastroesophageal reflux disease)   . Hyperlipemia   . Carotid artery disease ([place])   . Hypertension     dr berry  . Ventricular tachycardia, sustained ([place]) 04/27/2015    Past Surgical History  Procedure Laterality Date  . Cardiac surgery    . Colon surgery    . Elbow surgery    . Lumbar laminectomy/decompression microdiscectomy  09/09/2011    Procedure: LUMBAR LAMINECTOMY/DECOMPRESSION MICRODISCECTOMY 1 LEVEL;  Surgeon: Eustace Moore, MD;  Location: [facility];  Service: Neurosurgery;  Laterality: Right;  Right Lumbar [place] Hemilaminectomy   . Back  surgery    . Cholecystectomy    . Coronary artery bypass graft      x4    dr Redmond Pulling  . Total knee arthroplasty Left 07/26/2013    Procedure: TOTAL KNEE ARTHROPLASTY;  Surgeon: Ninetta Lights, MD;  Location: [facility];  Service: Orthopedics;  Laterality: Left;  . Cardiac catheterization N/A 04/27/2015    Procedure:  Left Heart Cath and Coronary Angiography;  Surgeon: Sherren Mocha, MD;  Location: [facility];  Service: Cardiovascular;  Laterality: N/A;  . Ep implantable device N/A 04/29/2015    Procedure: ICD Implant;  Surgeon: Deboraha Sprang, MD;  Location: [facility];  Service: Cardiovascular;  Laterality: N/A; St Jude ICD, serial number [id number].     Current Outpatient Prescriptions  Medication Sig Dispense Refill  . acetaminophen (TYLENOL) 325 MG tablet Take 650 mg by mouth every 6 (six) hours as needed for mild pain, moderate pain, fever or headache.    [name] aspirin EC 81 MG EC tablet Take 1 tablet (81 mg total) by mouth daily with breakfast.    . atorvastatin (LIPITOR) 40 MG tablet Take 1 tablet (40 mg total) by mouth at bedtime. Or at supper 30 tablet 6  . carvedilol (COREG) 6.25 MG tablet Take 1 tablet (6.25 mg total) by mouth 2 (two) times daily with a meal. 60 tablet 6  . Cholecalciferol 2000 UNITS TABS Take 2,000 Units by mouth daily.    . clopidogrel (PLAVIX) 75 MG tablet Take 1 tablet (75 mg total) by mouth daily. 90 tablet 3  . ezetimibe (ZETIA) 10 MG tablet Take 1 tablet (10 mg total) by mouth daily. 90 tablet 3  . lisinopril (PRINIVIL,ZESTRIL) 20 MG tablet Take 1 tablet (20 mg total) by mouth every morning. 30 tablet 6  . methocarbamol (ROBAXIN) 500 MG tablet Take 1 tablet (500 mg total) by mouth every 6 (six) hours as needed for muscle spasms. 90 tablet 1  . pantoprazole (PROTONIX) 40 MG tablet Take 40 mg by mouth 2 (two) times daily.     . tamsulosin (FLOMAX) 0.4 MG CAPS capsule Take 0.4 mg by mouth daily after breakfast.    . traMADol (ULTRAM) 50 MG tablet Take 50 mg by mouth every 6 (six) hours as needed for moderate pain or severe pain.    [name] amLODipine (NORVASC) 5 MG tablet Take 1 tablet (5 mg total) by mouth daily. 90 tablet 3   No current facility-administered medications for this visit.    Allergies:   Clarithromycin    Social History:  The patient  reports that  he quit smoking about 32 years ago. He has never used smokeless tobacco. He reports that he does not drink alcohol or use illicit drugs.   Family History:  The patient's family history includes Heart failure in his mother; Hypertension in his mother and other.    ROS:  Please see the history of present illness.   Otherwise, review of systems are positive for occasional dizziness in the morning and with activity, no syncope or presyncope.   All other systems are reviewed and negative.    PHYSICAL EXAM: VS:  BP 146/74 mmHg  Pulse 54  Ht 5\' 8"  (1.727 m)  Wt 194 lb 12.8 oz (88.361 kg)  BMI 29.63 kg/m2 , BMI Body mass index is 29.63 kg/(m^2). GEN: Well nourished, well developed, in no acute distress HEENT: normal Neck: no JVD, or masses +very light R carotid bruit on exam Cardiac: RRR; no murmurs, rubs, or  gallops,no edema +L pectoral pacer site clean, dry, well healed, no drainage or hematoma noted. No erythema around scar Respiratory:  clear to auscultation bilaterally, normal work of breathing GI: soft, nontender, nondistended, + BS MS: no deformity or atrophy Skin: warm and dry, no rash Neuro:  Strength and sensation are intact Psych: euthymic mood, full affect   EKG:  EKG is ordered today. The ekg ordered today demonstrates NSR, no sign of VT or PVCs, no significant ST-T wave changes   Recent Labs: 04/30/2015: BUN 10; Creatinine, Ser 0.94; Hemoglobin 13.5; Magnesium 1.8; Platelets 232; Potassium 3.8; Sodium 138    Lipid Panel No results found for: CHOL, TRIG, HDL, CHOLHDL, VLDL, LDLCALC, LDLDIRECT    Wt Readings from Last 3 Encounters:  07/25/15 194 lb 12.8 oz (88.361 kg)  06/06/15 198 lb 3.1 oz (89.9 kg)  05/10/15 192 lb (87.091 kg)      Other studies Reviewed: Additional studies/ records that were reviewed today include:   Echocardiogram 04/30/2015 LV EF: 40% -  45%  ------------------------------------------------------------------- History:  PMH:  Cardiomyopathy- Unspecified. Coronary artery disease. PMH:  Myocardial infarction. Risk factors: Hypertension. Dyslipidemia.  ------------------------------------------------------------------- Study Conclusions  - Left ventricle: There is basal and mid inferior and inferolateral hypokinesis. The cavity size was normal. Systolic function was mildly to moderately reduced. The estimated ejection fraction was in the range of 40% to 45%. Wall motion was normal; there were no regional wall motion abnormalities. Features are consistent with a pseudonormal left ventricular filling pattern, with concomitant abnormal relaxation and increased filling pressure (grade 2 diastolic dysfunction). - Aortic valve: There was mild regurgitation. - Aortic root: The aortic root was normal in size. - Mitral valve: Structurally normal valve. There was mild regurgitation. - Left atrium: The atrium was mildly dilated. - Right ventricle: Pacer wire or catheter noted in right ventricle. - Right atrium: Pacer wire or catheter noted in right atrium. - Tricuspid valve: There was mild regurgitation. - Inferior vena cava: The vessel was normal in size. - Pericardium, extracardiac: There was no pericardial effusion.   Cardiac cath 04/27/2015 Conclusion     LIMA .  The LIMA sequenced to the first diagonal and mid LAD. The graft is widely patent with no obstruction.  SVG .  The saphenous vein graft to first OM is widely patent. The OM target is a large caliber vessel that is totally occluded before the graft insertion, but the native vessel is widely patent beyond the graft insertion site.  Ost 1st Mrg to 1st Mrg lesion, 100% stenosed.  SVG .  The saphenous vein graft, presumably to distal RCA, 100% occluded at the aortic anastomosis  Origin lesion, 100% stenosed.  LM lesion, 80% stenosed.  Ost Cx lesion, 90% stenosed.  Prox LAD lesion, 90% stenosed.  Ost RCA lesion, 95%  stenosed.  Prox RCA lesion, 90% stenosed.  Mid RCA lesion, 80% stenosed.  There is moderate to severe left ventricular systolic dysfunction.  1. Severe native multivessel coronary artery disease with severe left main stenosis, proximal LAD stenosis, proximal left circumflex stenosis, and severe diffuse RCA stenosis  2. Status post remote aortocoronary bypass surgery with continued patency of the sequential LIMA graft to the first diagonal and LAD, and continued patency of the saphenous vein graft obtuse marginal.  3. Likely chronic occlusion of the saphenous vein graft RCA  4. Moderately severe LV segmental dysfunction consistent with this patient's known history of old inferior wall MI  I do not see any targets for PCI, nor do  I see evidence of an acute plaque rupture or culprit vessel. I suspect this patient had scar mediated ventricular tachycardia. We'll keep him on IV amiodarone and place an EP consult for consideration of an ICD for secondary prevention of sudden cardiac death.    Single chamber ICD implantation by Dr. Virl Axe 04/29/2015  Preop Dx: Ventricular tachycardia [employer] single coil active fixation defibrillator lead, model 181 serial number [id number] St Jude ICD, serial number [id number]   Review of the above records demonstrates:   H/o CABG in 1995, no further cath until 03/2015 when he came in with persistent VT, cath in 03/2015 showed no culprit lesion. ICD placed. He was initially placed on amiodarone, this has been taken off prior to discharge.    ASSESSMENT AND PLAN:  1. Bradycardia 2/2 taking both metoprolol and coreg  - metoprolol stopped, will continue coreg for his LV dysfunction and h/o VT, would prefer to keep his HR on the low side in the 50s to prevent recurrence of VT  - I have went through his medications bag and identified each bottle of medications with the patient. Other than stop metoprolol and add amlodipine, no other medication  changes needed at this time.  2. CAD s/p CABG: continue ASA and plavix. Recent cath in 03/2015 reassuring.   3. ICM/chronic combined systolic and diastolic HF: continue coreg and lisinopril. Echo 04/2015 EF 40-45%  4. Recent persistent VT s/p ICD placement: required IV amio in Oct 2016 admission, amio stopped at EP recommendation. S/p Single lead St Jude ICD. Last interrogation 1/4, normal device function, therefore I did not interrogate device today  5. Moderate carotid artery disease: carotid ultrasound on 11/10 which showed 60-79% right ICA stenosis, 40-59% left ICA stenosis. Greater than 50% right subclavian stenosis. Repeat U/S in 1 year.  6. HLD: on 40mg  lipitor and 10mg  zetia. Per pt, he checked his lipid at [facility], I will defer lipid and LFT for now.  7. HTN: on 20mg  lisinopril and 6.25mg  BID coreg, BP still uncontrolled, BP 146/74, will add amlodipine 5mg  daily.    Current medicines are reviewed at length with the patient today.  The patient has concerns regarding medicines. All concerns answered, instructed to take lisinopril and coreg in AM, amlodipine and coreg along with lipitor in PM.  The following changes have been made:  Add Amlodipine 5mg  daily (given 60 day supply to local pharmacy), also given 1 year paper Rx for patient to take to [facility] hospital  Labs/ tests ordered today include:   Orders Placed This Encounter  Procedures  . EKG 12-Lead     Disposition:   FU with Dr. Gwenlyn Found and Dr. Lovena Le in Feb as previously instructed  Signed, Almyra Deforest, [place]  07/25/2015 9:00 AM    [address], [place], [place]  [address] Phone: [phone]; Fax: [phone]

## 2015-07-26 ENCOUNTER — Encounter (HOSPITAL_COMMUNITY): Payer: Medicare Other

## 2015-07-29 ENCOUNTER — Encounter (HOSPITAL_COMMUNITY): Payer: Medicare Other

## 2015-07-30 ENCOUNTER — Encounter: Payer: Self-pay | Admitting: Physician Assistant

## 2015-07-31 ENCOUNTER — Encounter (HOSPITAL_COMMUNITY)
Admission: RE | Admit: 2015-07-31 | Discharge: 2015-07-31 | Disposition: A | Discharge status: Home / Self Care | Payer: Medicare Other | Source: Ambulatory Visit | Attending: Cardiovascular Disease | Admitting: Cardiovascular Disease

## 2015-07-31 DIAGNOSIS — I25810 Atherosclerosis of coronary artery bypass graft(s) without angina pectoris: Secondary | ICD-10-CM | POA: Diagnosis not present

## 2015-07-31 NOTE — Progress Notes (Signed)
Pt returned to exercise today.  Pt out due to increased work loads.  Noted medication changes from the follow up appt last week. Pt tolerated exercise with no complaints. BP remained within normal limits. Cherre Huger, BSN

## 2015-08-02 ENCOUNTER — Encounter (HOSPITAL_COMMUNITY)
Admission: RE | Admit: 2015-08-02 | Discharge: 2015-08-02 | Disposition: A | Discharge status: Home / Self Care | Payer: Medicare Other | Source: Ambulatory Visit | Attending: Cardiovascular Disease | Admitting: Cardiovascular Disease

## 2015-08-02 DIAGNOSIS — I25810 Atherosclerosis of coronary artery bypass graft(s) without angina pectoris: Secondary | ICD-10-CM | POA: Diagnosis not present

## 2015-08-05 ENCOUNTER — Encounter (HOSPITAL_COMMUNITY)
Admission: RE | Admit: 2015-08-05 | Discharge: 2015-08-05 | Disposition: A | Discharge status: Home / Self Care | Payer: Medicare Other | Source: Ambulatory Visit | Attending: Cardiovascular Disease | Admitting: Cardiovascular Disease

## 2015-08-05 DIAGNOSIS — I25810 Atherosclerosis of coronary artery bypass graft(s) without angina pectoris: Secondary | ICD-10-CM | POA: Diagnosis not present

## 2015-08-06 ENCOUNTER — Encounter: Payer: Self-pay | Admitting: Internal Medicine

## 2015-08-06 ENCOUNTER — Ambulatory Visit (INDEPENDENT_AMBULATORY_CARE_PROVIDER_SITE_OTHER): Payer: Medicare Other | Admitting: Internal Medicine

## 2015-08-06 VITALS — BP 122/68 | HR 59 | Ht 68.0 in | Wt 198.0 lb

## 2015-08-06 DIAGNOSIS — Z95810 Presence of automatic (implantable) cardiac defibrillator: Secondary | ICD-10-CM | POA: Diagnosis not present

## 2015-08-06 DIAGNOSIS — I255 Ischemic cardiomyopathy: Secondary | ICD-10-CM

## 2015-08-06 DIAGNOSIS — I472 Ventricular tachycardia, unspecified: Secondary | ICD-10-CM

## 2015-08-06 LAB — CUP PACEART INCLINIC DEVICE CHECK
Date Time Interrogation Session: 20170207144610
HIGH POWER IMPEDANCE MEASURED VALUE: 60.75 Ohm
Implantable Lead Implant Date: 20161031
Implantable Lead Location: 753860
Implantable Lead Model: 181
Implantable Lead Serial Number: 333140
Lead Channel Pacing Threshold Amplitude: 0.75 V
Lead Channel Pacing Threshold Pulse Width: 0.5 ms
Lead Channel Pacing Threshold Pulse Width: 0.5 ms
Lead Channel Setting Pacing Pulse Width: 0.5 ms
MDC IDC MSMT BATTERY REMAINING LONGEVITY: 103.2
MDC IDC MSMT LEADCHNL RV IMPEDANCE VALUE: 362.5 Ohm
MDC IDC MSMT LEADCHNL RV PACING THRESHOLD AMPLITUDE: 0.75 V
MDC IDC MSMT LEADCHNL RV SENSING INTR AMPL: 11.7 mV
MDC IDC PG SERIAL: 7308468
MDC IDC SET LEADCHNL RV PACING AMPLITUDE: 2.5 V
MDC IDC SET LEADCHNL RV SENSING SENSITIVITY: 0.5 mV
MDC IDC STAT BRADY RV PERCENT PACED: 1.8 %

## 2015-08-06 NOTE — Progress Notes (Signed)
Patient Care Team: Garner Gavel, MD as PCP - General (Family Medicine)   HPI  Victor Estes is a 70 y.o. male Seen in follow-up for ICD implantation 10/16 for hemodynamically unstable ventricular tachycardia in the setting of coronary artery disease and prior bypass surgery. Catheterization demonstrated severe three-vessel disease and graft disease but no candidate for revascularization. Ejection fraction was 35-40%.  He was started on amiodarone. This has been stopped  He underwent single- ICD implantation.  He is back to work. notes no chest pain no shortness of breath   Past Medical History  Diagnosis Date  . Myocardial infarct ([place])     CABG-1995- LIMA-D1-LAD, SVG-OM, SVG-RCA   . Cancer ([place])   . GERD (gastroesophageal reflux disease)   . Hyperlipemia   . Carotid artery disease ([place])   . Hypertension     dr berry  . Ventricular tachycardia, sustained ([place]) 04/27/2015    Past Surgical History  Procedure Laterality Date  . Cardiac surgery    . Colon surgery    . Elbow surgery    . Lumbar laminectomy/decompression microdiscectomy  09/09/2011    Procedure: LUMBAR LAMINECTOMY/DECOMPRESSION MICRODISCECTOMY 1 LEVEL;  Surgeon: Eustace Moore, MD;  Location: [facility];  Service: Neurosurgery;  Laterality: Right;  Right Lumbar Five-Sacral One Hemilaminectomy   . Back surgery    . Cholecystectomy    . Coronary artery bypass graft      x4    dr Redmond Pulling  . Total knee arthroplasty Left 07/26/2013    Procedure: TOTAL KNEE ARTHROPLASTY;  Surgeon: Ninetta Lights, MD;  Location: [facility];  Service: Orthopedics;  Laterality: Left;  . Cardiac catheterization N/A 04/27/2015    Procedure: Left Heart Cath and Coronary Angiography;  Surgeon: Sherren Mocha, MD;  Location: [facility];  Service: Cardiovascular;  Laterality: N/A;  . Ep implantable device N/A 04/29/2015    Procedure: ICD Implant;  Surgeon: Deboraha Sprang, MD;  Location: [facility];  Service:  Cardiovascular;  Laterality: N/A; St Jude ICD, serial number [id number].    Current Outpatient Prescriptions  Medication Sig Dispense Refill  . acetaminophen (TYLENOL) 325 MG tablet Take 650 mg by mouth every 6 (six) hours as needed for mild pain, moderate pain, fever or headache.    [name] amLODipine (NORVASC) 5 MG tablet Take 1 tablet (5 mg total) by mouth daily. 90 tablet 3  . aspirin EC 81 MG EC tablet Take 1 tablet (81 mg total) by mouth daily with breakfast.    . atorvastatin (LIPITOR) 40 MG tablet Take 1 tablet (40 mg total) by mouth at bedtime. Or at supper 30 tablet 6  . carvedilol (COREG) 6.25 MG tablet Take 1 tablet (6.25 mg total) by mouth 2 (two) times daily with a meal. 60 tablet 6  . Cholecalciferol 2000 UNITS TABS Take 2,000 Units by mouth daily.    . clopidogrel (PLAVIX) 75 MG tablet Take 1 tablet (75 mg total) by mouth daily. 90 tablet 3  . ezetimibe (ZETIA) 10 MG tablet Take 1 tablet (10 mg total) by mouth daily. 90 tablet 3  . lisinopril (PRINIVIL,ZESTRIL) 20 MG tablet Take 1 tablet (20 mg total) by mouth every morning. 30 tablet 6  . methocarbamol (ROBAXIN) 500 MG tablet Take 1 tablet (500 mg total) by mouth every 6 (six) hours as needed for muscle spasms. 90 tablet 1  . pantoprazole (PROTONIX) 40 MG tablet Take 40 mg by mouth 2 (two) times daily.     [name]  tamsulosin (FLOMAX) 0.4 MG CAPS capsule Take 0.4 mg by mouth daily after breakfast.    . traMADol (ULTRAM) 50 MG tablet Take 50 mg by mouth every 6 (six) hours as needed for moderate pain or severe pain.     No current facility-administered medications for this visit.    Allergies  Allergen Reactions  . Clarithromycin Swelling      Review of Systems negative except from HPI and PMH  Physical Exam BP 122/68 mmHg  Pulse 59  Ht 5\' 8"  (1.727 m)  Wt 198 lb (89.812 kg)  BMI 30.11 kg/m2 Well developed and well nourished in no acute distress HENT normal E scleral and icterus clear Neck Supple JVP flat; carotids brisk  and full Clear to ausculation Device pocket well healed; without hematoma or erythema.  There is no tethering  Regular rate and rhythm, no murmurs gallops or rub Soft with active bowel sounds No clubbing cyanosis  Edema Alert and oriented, grossly normal motor and sensory function Skin Warm and Dry   ECG 26 January demonstrated sinus rhythm at 54 Intervals 26/10/45  Assessment and  Plan  Ventricular tachycardia   Implantable defibrillator-St. Jude device output reprogrammed   Ischemic cardiomyopathy with prior bypass surgery   Sinus bradycardia    Overall the patient is doing very well. Device function is normal. We will continue current medications. His bradycardia appears to be asymptomatic.

## 2015-08-06 NOTE — Patient Instructions (Signed)
Medication Instructions: - Your physician recommends that you continue on your current medications as directed. Please refer to the Current Medication list given to you today.  Labwork: - none  Procedures/Testing: - none  Follow-Up: - Remote monitoring is used to monitor your Pacemaker of ICD from home. This monitoring reduces the number of office visits required to check your device to one time per year. It allows [place] to keep an eye on the functioning of your device to ensure it is working properly. You are scheduled for a device check from home on 11/05/15. You may send your transmission at any time that day. If you have a wireless device, the transmission will be sent automatically. After your physician reviews your transmission, you will receive a postcard with your next transmission date.  - Your physician wants you to follow-up in: 9 months with Dr. Caryl Comes. You will receive a reminder letter in the mail two months in advance. If you don't receive a letter, please call our office to schedule the follow-up appointment.  Any Additional Special Instructions Will Be Listed Below (If Applicable).     If you need a refill on your cardiac medications before your next appointment, please call your pharmacy.

## 2015-08-07 ENCOUNTER — Encounter (HOSPITAL_COMMUNITY)
Admission: RE | Admit: 2015-08-07 | Discharge: 2015-08-07 | Disposition: A | Discharge status: Home / Self Care | Payer: Medicare Other | Source: Ambulatory Visit | Attending: Cardiovascular Disease | Admitting: Cardiovascular Disease

## 2015-08-07 DIAGNOSIS — I25810 Atherosclerosis of coronary artery bypass graft(s) without angina pectoris: Secondary | ICD-10-CM | POA: Diagnosis not present

## 2015-08-09 ENCOUNTER — Encounter (HOSPITAL_COMMUNITY): Payer: Medicare Other

## 2015-08-12 ENCOUNTER — Encounter (HOSPITAL_COMMUNITY)
Admission: RE | Admit: 2015-08-12 | Discharge: 2015-08-12 | Disposition: A | Discharge status: Home / Self Care | Payer: Medicare Other | Source: Ambulatory Visit | Attending: Cardiovascular Disease | Admitting: Cardiovascular Disease

## 2015-08-12 DIAGNOSIS — I25810 Atherosclerosis of coronary artery bypass graft(s) without angina pectoris: Secondary | ICD-10-CM | POA: Diagnosis not present

## 2015-08-14 ENCOUNTER — Encounter (HOSPITAL_COMMUNITY)
Admission: RE | Admit: 2015-08-14 | Discharge: 2015-08-14 | Disposition: A | Discharge status: Home / Self Care | Payer: Medicare Other | Source: Ambulatory Visit | Attending: Cardiovascular Disease | Admitting: Cardiovascular Disease

## 2015-08-14 DIAGNOSIS — I25810 Atherosclerosis of coronary artery bypass graft(s) without angina pectoris: Secondary | ICD-10-CM | POA: Diagnosis not present

## 2015-08-16 ENCOUNTER — Encounter (HOSPITAL_COMMUNITY)
Admission: RE | Admit: 2015-08-16 | Discharge: 2015-08-16 | Disposition: A | Discharge status: Home / Self Care | Payer: Medicare Other | Source: Ambulatory Visit | Attending: Cardiovascular Disease | Admitting: Cardiovascular Disease

## 2015-08-16 ENCOUNTER — Ambulatory Visit: Payer: Medicare Other | Admitting: Cardiovascular Disease

## 2015-08-16 DIAGNOSIS — I25810 Atherosclerosis of coronary artery bypass graft(s) without angina pectoris: Secondary | ICD-10-CM | POA: Diagnosis not present

## 2015-08-19 ENCOUNTER — Encounter (HOSPITAL_COMMUNITY)
Admission: RE | Admit: 2015-08-19 | Discharge: 2015-08-19 | Disposition: A | Discharge status: Home / Self Care | Payer: Medicare Other | Source: Ambulatory Visit | Attending: Cardiovascular Disease | Admitting: Cardiovascular Disease

## 2015-08-19 DIAGNOSIS — I25810 Atherosclerosis of coronary artery bypass graft(s) without angina pectoris: Secondary | ICD-10-CM | POA: Diagnosis not present

## 2015-08-21 ENCOUNTER — Encounter (HOSPITAL_COMMUNITY)
Admission: RE | Admit: 2015-08-21 | Discharge: 2015-08-21 | Disposition: A | Discharge status: Home / Self Care | Payer: Medicare Other | Source: Ambulatory Visit | Attending: Cardiovascular Disease | Admitting: Cardiovascular Disease

## 2015-08-21 DIAGNOSIS — I25810 Atherosclerosis of coronary artery bypass graft(s) without angina pectoris: Secondary | ICD-10-CM | POA: Diagnosis not present

## 2015-08-23 ENCOUNTER — Encounter (HOSPITAL_COMMUNITY)
Admission: RE | Admit: 2015-08-23 | Discharge: 2015-08-23 | Disposition: A | Discharge status: Home / Self Care | Payer: Medicare Other | Source: Ambulatory Visit | Attending: Cardiovascular Disease | Admitting: Cardiovascular Disease

## 2015-08-23 DIAGNOSIS — I25810 Atherosclerosis of coronary artery bypass graft(s) without angina pectoris: Secondary | ICD-10-CM | POA: Diagnosis not present

## 2015-08-26 ENCOUNTER — Encounter (HOSPITAL_COMMUNITY)
Admission: RE | Admit: 2015-08-26 | Discharge: 2015-08-26 | Disposition: A | Discharge status: Home / Self Care | Payer: Medicare Other | Source: Ambulatory Visit | Attending: Cardiovascular Disease | Admitting: Cardiovascular Disease

## 2015-08-26 DIAGNOSIS — I25810 Atherosclerosis of coronary artery bypass graft(s) without angina pectoris: Secondary | ICD-10-CM | POA: Diagnosis not present

## 2015-08-28 ENCOUNTER — Encounter (HOSPITAL_COMMUNITY)
Admission: RE | Admit: 2015-08-28 | Discharge: 2015-08-28 | Disposition: A | Discharge status: Home / Self Care | Payer: Medicare Other | Source: Ambulatory Visit | Attending: Cardiovascular Disease | Admitting: Cardiovascular Disease

## 2015-08-28 DIAGNOSIS — I25810 Atherosclerosis of coronary artery bypass graft(s) without angina pectoris: Secondary | ICD-10-CM | POA: Insufficient documentation

## 2015-08-30 ENCOUNTER — Encounter (HOSPITAL_COMMUNITY)
Admission: RE | Admit: 2015-08-30 | Discharge: 2015-08-30 | Disposition: A | Discharge status: Home / Self Care | Payer: Medicare Other | Source: Ambulatory Visit | Attending: Cardiovascular Disease | Admitting: Cardiovascular Disease

## 2015-08-30 DIAGNOSIS — I25810 Atherosclerosis of coronary artery bypass graft(s) without angina pectoris: Secondary | ICD-10-CM | POA: Diagnosis not present

## 2015-09-02 ENCOUNTER — Encounter (HOSPITAL_COMMUNITY)
Admission: RE | Admit: 2015-09-02 | Discharge: 2015-09-02 | Disposition: A | Discharge status: Home / Self Care | Payer: Medicare Other | Source: Ambulatory Visit | Attending: Cardiovascular Disease | Admitting: Cardiovascular Disease

## 2015-09-02 DIAGNOSIS — I25810 Atherosclerosis of coronary artery bypass graft(s) without angina pectoris: Secondary | ICD-10-CM | POA: Diagnosis not present

## 2015-09-04 ENCOUNTER — Ambulatory Visit (INDEPENDENT_AMBULATORY_CARE_PROVIDER_SITE_OTHER): Payer: Medicare Other | Admitting: Cardiovascular Disease

## 2015-09-04 ENCOUNTER — Encounter: Payer: Self-pay | Admitting: Cardiovascular Disease

## 2015-09-04 ENCOUNTER — Encounter (HOSPITAL_COMMUNITY)
Admission: RE | Admit: 2015-09-04 | Discharge: 2015-09-04 | Disposition: A | Discharge status: Home / Self Care | Payer: Medicare Other | Source: Ambulatory Visit | Attending: Cardiovascular Disease | Admitting: Cardiovascular Disease

## 2015-09-04 VITALS — BP 114/60 | HR 54 | Ht 68.0 in | Wt 193.8 lb

## 2015-09-04 DIAGNOSIS — I472 Ventricular tachycardia, unspecified: Secondary | ICD-10-CM

## 2015-09-04 DIAGNOSIS — E785 Hyperlipidemia, unspecified: Secondary | ICD-10-CM

## 2015-09-04 DIAGNOSIS — I25810 Atherosclerosis of coronary artery bypass graft(s) without angina pectoris: Secondary | ICD-10-CM | POA: Diagnosis not present

## 2015-09-04 DIAGNOSIS — I2510 Atherosclerotic heart disease of native coronary artery without angina pectoris: Secondary | ICD-10-CM

## 2015-09-04 DIAGNOSIS — I779 Disorder of arteries and arterioles, unspecified: Secondary | ICD-10-CM | POA: Diagnosis not present

## 2015-09-04 DIAGNOSIS — I739 Peripheral vascular disease, unspecified: Secondary | ICD-10-CM

## 2015-09-04 DIAGNOSIS — I10 Essential (primary) hypertension: Secondary | ICD-10-CM | POA: Diagnosis not present

## 2015-09-04 DIAGNOSIS — I255 Ischemic cardiomyopathy: Secondary | ICD-10-CM

## 2015-09-04 DIAGNOSIS — I2583 Coronary atherosclerosis due to lipid rich plaque: Principal | ICD-10-CM

## 2015-09-04 NOTE — Assessment & Plan Note (Signed)
History of coronary artery disease status post myocardial infarction back in 1993 and subsequent coronary artery artery bypass grafting X 4 in 1995. He recently was admitted 04/27/15 through 04/30/15 for ventricular tachycardia requiring DC cardioversion. He also underwent cardiac catheterization by Dr. Burt Knack on 04/27/15 revealing severe native coronary artery disease with a patent LIMA to the LAD diagonal branch, patent vein to an obtuse marginal branch and occluded vein to the RCA thought to be in infarct territory remotely. Medical therapy was recommended. It was assumed that he had "scar VT" and he subsequently underwent implantation of an ICD by Dr. Caryl Comes for secondary prevention.

## 2015-09-04 NOTE — Assessment & Plan Note (Signed)
History of hyperlipidemia on statin therapy and by the [facility] in [place].

## 2015-09-04 NOTE — Assessment & Plan Note (Signed)
History of hypertension blood pressure measured at 114/60. He is on amlodipine, carvedilol and lisinopril. Continue current meds at current dosing

## 2015-09-04 NOTE — Assessment & Plan Note (Signed)
The patient presented with sustained ventricular tachycardia. He was symptomatic and underwent cardioversion. He is placed on amiodarone and ultimately had an ICD placed for what was thought to be "scar VT. He recently follow up with Dr. Caryl Comes in the office. He's had no further symptoms or ICD discharges.

## 2015-09-04 NOTE — Assessment & Plan Note (Signed)
History of carotid artery disease with moderate left and mild right ICA stenosis followed by duplex ultrasound on annual basis.

## 2015-09-04 NOTE — Patient Instructions (Signed)
Medication Instructions:  Your physician recommends that you continue on your current medications as directed. Please refer to the Current Medication list given to you today.   Labwork: I will get your lab work from your Primary Care Physician.   Testing/Procedures: none  Follow-Up: Your physician wants you to follow-up in: 6 months with Dr. Gwenlyn Found. You will receive a reminder letter in the mail two months in advance. If you don't receive a letter, please call our office to schedule the follow-up appointment.   Any Other Special Instructions Will Be Listed Below (If Applicable).     If you need a refill on your cardiac medications before your next appointment, please call your pharmacy.

## 2015-09-04 NOTE — Progress Notes (Signed)
09/04/2015 Victor Estes   01/22/1946  DO:[id number]  Primary Physician Victor Gavel, MD Primary Cardiologist: Lorretta Harp MD Renae Gloss   HPI:  The patient is a delightful 70 year old, mildly overweight, married Caucasian male, father of 64, grandfather to 5 grandchildren who I have been taking care of for the last 23years. I last saw him in the office 04/17/15.Marland KitchenHe had an MI back in 1993 and subsequent coronary artery bypass grafting x4 in 1995. His risk factors are remarkable for remote tobacco abuse, hypertension, hyperlipidemia, and family history. His last Myoview performed 2 year ago showed inferior scar without ischemia, unchanged from prior studies. A 2D echo revealed an EF of 45% to 50% with inferior and septal hypokinesia. Carotid Dopplers showed moderate right ICA stenosis unchanged from prior studies. He is neurologically asymptomatic. [facility] [place] 03[facility]31/13 revealed a total cholesterol of 141, LDL 61 HDL 63. He denies chest pain or shortness of breath. He had elective TKR replacement by Dr. Percell Miller 07/05/13. I obtained a Myoview stress test 03/29/13 that showed impaired scar without ischemia. The epicardium slightly to 40% by quantitative gated SPECT. A 2-D echocardiogram performed 05/24/13 revealed an ejection fraction of 40-45%.. Since I saw him a year ago he's remained clinically stable. He gets a rare episode of dizziness on the [place]. 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 He was [facility] to [facility] on 04/27/15 with sustained ventricular tachycardia requiring cardioversion. He had a cardiac catheterization by Dr. Burt Knack revealing stable anatomy without a "culprit lesion and therefore his VT was thought to be "scar related". Based on this, Dr. Caryl Comes placed a ICD for secondary prevention as follow this up as an outpatient since. He continues to do well and was  asymptomatic.   Current Outpatient Prescriptions  Medication Sig Dispense Refill  . acetaminophen (TYLENOL) 325 MG tablet Take 650 mg by mouth every 6 (six) hours as needed for mild pain, moderate pain, fever or headache.    [name] amLODipine (NORVASC) 5 MG tablet Take 1 tablet (5 mg total) by mouth daily. 90 tablet 3  . aspirin EC 81 MG EC tablet Take 1 tablet (81 mg total) by mouth daily with breakfast.    . atorvastatin (LIPITOR) 40 MG tablet Take 1 tablet (40 mg total) by mouth at bedtime. Or at supper 30 tablet 6  . carvedilol (COREG) 6.25 MG tablet Take 1 tablet (6.25 mg total) by mouth 2 (two) times daily with a meal. 60 tablet 6  . Cholecalciferol 2000 UNITS TABS Take 2,000 Units by mouth daily.    . clopidogrel (PLAVIX) 75 MG tablet Take 1 tablet (75 mg total) by mouth daily. 90 tablet 3  . ezetimibe (ZETIA) 10 MG tablet Take 1 tablet (10 mg total) by mouth daily. 90 tablet 3  . lisinopril (PRINIVIL,ZESTRIL) 20 MG tablet Take 1 tablet (20 mg total) by mouth every morning. 30 tablet 6  . methocarbamol (ROBAXIN) 500 MG tablet Take 1 tablet (500 mg total) by mouth every 6 (six) hours as needed for muscle spasms. 90 tablet 1  . pantoprazole (PROTONIX) 40 MG tablet Take 40 mg by mouth 2 (two) times daily.     . tamsulosin (FLOMAX) 0.4 MG CAPS capsule Take 0.4 mg by mouth daily after breakfast.    . traMADol (ULTRAM) 50 MG tablet Take 50 mg by mouth every 6 (six) hours as needed for moderate pain or severe pain.  No current facility-administered medications for this visit.    Allergies  Allergen Reactions  . Clarithromycin Swelling    Social History   Social History  . Marital Status: Married    Spouse Name: N/A  . Number of Children: N/A  . Years of Education: N/A   Occupational History  . 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 Smoking status: Former Smoker    Quit date: 04/06/1983  . Smokeless tobacco: Never Used  . Alcohol Use: No  . Drug Use: No  .  Sexual Activity: Not on file   Other Topics Concern  . Not on file   Social History Narrative     Review of Systems: General: negative for chills, fever, night sweats or weight changes.  Cardiovascular: negative for chest pain, dyspnea on exertion, edema, orthopnea, palpitations, paroxysmal nocturnal dyspnea or shortness of breath Dermatological: negative for rash Respiratory: negative for cough or wheezing Urologic: negative for hematuria Abdominal: negative for nausea, vomiting, diarrhea, bright red blood per rectum, melena, or hematemesis Neurologic: negative for visual changes, syncope, or dizziness All other systems reviewed and are otherwise negative except as noted above.    Blood pressure 114/60, pulse 54, height 5\' 8"  (1.727 m), weight 193 lb 12.8 oz (87.907 kg).  General appearance: alert and no distress Neck: no adenopathy, no carotid bruit, no JVD, supple, symmetrical, trachea midline and thyroid not enlarged, symmetric, no tenderness/mass/nodules Lungs: clear to auscultation bilaterally Heart: regular rate and rhythm, S1, S2 normal, no murmur, click, rub or gallop Extremities: extremities normal, atraumatic, no cyanosis or edema  EKG not performed today  ASSESSMENT AND PLAN:   Coronary artery disease History of coronary artery disease status post myocardial infarction back in 1993 and subsequent coronary artery artery bypass grafting X 4 in 1995. He recently was admitted 04/27/15 through 04/30/15 for ventricular tachycardia requiring DC cardioversion. He also underwent cardiac catheterization by Dr. Burt Knack on 04/27/15 revealing severe native coronary artery disease with a patent LIMA to the LAD diagonal branch, patent vein to an obtuse marginal branch and occluded vein to the RCA thought to be in infarct territory remotely. Medical therapy was recommended. It was assumed that he had "scar VT" and he subsequently underwent implantation of an ICD by Dr. Caryl Comes for secondary  prevention.  Essential hypertension History of hypertension blood pressure measured at 114/60. He is on amlodipine, carvedilol and lisinopril. Continue current meds at current dosing  Hyperlipidemia History of hyperlipidemia on statin therapy and by the [facility] in [place].  Carotid artery disease History of carotid artery disease with moderate left and mild right ICA stenosis followed by duplex ultrasound on annual basis.  Ventricular tachycardia, sustained ([place]) The patient presented with sustained ventricular tachycardia. He was symptomatic and underwent cardioversion. He is placed on amiodarone and ultimately had an ICD placed for what was thought to be "scar VT. He recently follow up with Dr. Caryl Comes in the office. He's had no further symptoms or ICD discharges.  Ischemic cardiomyopathy History of ischemic cardiac myopathy with an EF of 40-45% on appropriate medications. He is asymptomatic from this.      Lorretta Harp MD FACP,FACC,FAHA, [facility] 09/04/2015 3:17 PM

## 2015-09-04 NOTE — Assessment & Plan Note (Signed)
History of ischemic cardiac myopathy with an EF of 40-45% on appropriate medications. He is asymptomatic from this.

## 2015-09-06 ENCOUNTER — Encounter (HOSPITAL_COMMUNITY)
Admission: RE | Admit: 2015-09-06 | Discharge: 2015-09-06 | Disposition: A | Discharge status: Home / Self Care | Payer: Medicare Other | Source: Ambulatory Visit | Attending: Cardiovascular Disease | Admitting: Cardiovascular Disease

## 2015-09-06 DIAGNOSIS — I25810 Atherosclerosis of coronary artery bypass graft(s) without angina pectoris: Secondary | ICD-10-CM | POA: Diagnosis not present

## 2015-09-09 ENCOUNTER — Encounter (HOSPITAL_COMMUNITY)
Admission: RE | Admit: 2015-09-09 | Discharge: 2015-09-09 | Disposition: A | Discharge status: Home / Self Care | Payer: Medicare Other | Source: Ambulatory Visit | Attending: Cardiovascular Disease | Admitting: Cardiovascular Disease

## 2015-09-09 DIAGNOSIS — I25810 Atherosclerosis of coronary artery bypass graft(s) without angina pectoris: Secondary | ICD-10-CM | POA: Diagnosis not present

## 2015-09-11 ENCOUNTER — Encounter (HOSPITAL_COMMUNITY)
Admission: RE | Admit: 2015-09-11 | Discharge: 2015-09-11 | Disposition: A | Discharge status: Home / Self Care | Payer: Medicare Other | Source: Ambulatory Visit | Attending: Cardiovascular Disease | Admitting: Cardiovascular Disease

## 2015-09-11 DIAGNOSIS — I25810 Atherosclerosis of coronary artery bypass graft(s) without angina pectoris: Secondary | ICD-10-CM | POA: Diagnosis not present

## 2015-09-13 ENCOUNTER — Encounter (HOSPITAL_COMMUNITY)
Admission: RE | Admit: 2015-09-13 | Discharge: 2015-09-13 | Disposition: A | Discharge status: Home / Self Care | Payer: Medicare Other | Source: Ambulatory Visit | Attending: Cardiovascular Disease | Admitting: Cardiovascular Disease

## 2015-09-13 DIAGNOSIS — I25810 Atherosclerosis of coronary artery bypass graft(s) without angina pectoris: Secondary | ICD-10-CM | POA: Diagnosis not present

## 2015-09-16 ENCOUNTER — Encounter (HOSPITAL_COMMUNITY)
Admission: RE | Admit: 2015-09-16 | Discharge: 2015-09-16 | Disposition: A | Discharge status: Home / Self Care | Payer: Medicare Other | Source: Ambulatory Visit | Attending: Cardiovascular Disease | Admitting: Cardiovascular Disease

## 2015-09-16 DIAGNOSIS — I25810 Atherosclerosis of coronary artery bypass graft(s) without angina pectoris: Secondary | ICD-10-CM | POA: Diagnosis not present

## 2015-09-18 ENCOUNTER — Encounter (HOSPITAL_COMMUNITY): Payer: Medicare Other

## 2015-09-20 ENCOUNTER — Encounter (HOSPITAL_COMMUNITY)
Admission: RE | Admit: 2015-09-20 | Discharge: 2015-09-20 | Disposition: A | Discharge status: Home / Self Care | Payer: Medicare Other | Source: Ambulatory Visit | Attending: Cardiovascular Disease | Admitting: Cardiovascular Disease

## 2015-09-20 DIAGNOSIS — I25810 Atherosclerosis of coronary artery bypass graft(s) without angina pectoris: Secondary | ICD-10-CM | POA: Diagnosis not present

## 2015-09-23 ENCOUNTER — Encounter (HOSPITAL_COMMUNITY)
Admission: RE | Admit: 2015-09-23 | Discharge: 2015-09-23 | Disposition: A | Discharge status: Home / Self Care | Payer: Medicare Other | Source: Ambulatory Visit | Attending: Cardiovascular Disease | Admitting: Cardiovascular Disease

## 2015-09-23 DIAGNOSIS — I25810 Atherosclerosis of coronary artery bypass graft(s) without angina pectoris: Secondary | ICD-10-CM | POA: Diagnosis not present

## 2015-09-23 NOTE — Progress Notes (Signed)
Pt graduated from cardiac rehab program today with completion of 36 exercise sessions in Phase II. Pt maintained good attendance to exercise and education classes. Pt progressed nicely during his participation in rehab as evidenced by increased MET level.  Pt MET level increased from 4.1 to 4.3.  Medication list reconciled. Repeat  PHQ score- 0 .  Pt has support at home with his wife.  Pt noted to interact well with fellow participants and offer encouragement to the newer patients.  Pt has made  lifestyle changes and should be commended for his success. Pt feels he has achieved his goals during cardiac rehab. Pt feels more confident in his heart and his overall medical status.  Pt no longer feels anxious about how he is doing and has learned the warning signs and when to alert MD from attending education classes.   Pt plans to continue exercise by walking in the mall at least 3 times a week and will try to fit more walks in as his work schedule as a Financial planner.  It was indeed a pleasure to work with this delightful patient. Cherre Huger, BSN

## 2015-09-25 ENCOUNTER — Encounter (HOSPITAL_COMMUNITY): Payer: Medicare Other

## 2015-09-27 ENCOUNTER — Other Ambulatory Visit: Payer: Self-pay | Admitting: Physician Assistant

## 2015-09-27 ENCOUNTER — Encounter (HOSPITAL_COMMUNITY): Payer: Medicare Other

## 2015-09-27 NOTE — Telephone Encounter (Signed)
Please review for refill. Thanks!  

## 2015-09-27 NOTE — Telephone Encounter (Signed)
Rx(s) sent to pharmacy electronically.  

## 2015-09-30 ENCOUNTER — Encounter (HOSPITAL_COMMUNITY): Payer: Medicare Other

## 2015-10-02 ENCOUNTER — Encounter (HOSPITAL_COMMUNITY): Payer: Medicare Other

## 2015-10-04 ENCOUNTER — Encounter (HOSPITAL_COMMUNITY): Payer: Medicare Other

## 2015-10-07 ENCOUNTER — Encounter (HOSPITAL_COMMUNITY): Payer: Medicare Other

## 2015-10-09 ENCOUNTER — Encounter (HOSPITAL_COMMUNITY): Payer: Medicare Other

## 2015-10-11 ENCOUNTER — Encounter (HOSPITAL_COMMUNITY): Payer: Medicare Other

## 2015-11-05 ENCOUNTER — Ambulatory Visit (INDEPENDENT_AMBULATORY_CARE_PROVIDER_SITE_OTHER): Payer: Medicare Other | Admitting: *Deleted

## 2015-11-05 DIAGNOSIS — I472 Ventricular tachycardia, unspecified: Secondary | ICD-10-CM

## 2015-11-06 NOTE — Progress Notes (Signed)
Remote ICD transmission.   

## 2015-12-11 ENCOUNTER — Encounter: Payer: Self-pay | Admitting: Cardiology

## 2015-12-12 LAB — CUP PACEART REMOTE DEVICE CHECK
Battery Remaining Percentage: 94 %
Battery Voltage: 3.2 V
Date Time Interrogation Session: 20170509060018
HIGH POWER IMPEDANCE MEASURED VALUE: 63 Ohm
HIGH POWER IMPEDANCE MEASURED VALUE: 63 Ohm
Implantable Lead Implant Date: 20161031
Lead Channel Impedance Value: 360 Ohm
Lead Channel Pacing Threshold Pulse Width: 0.5 ms
Lead Channel Sensing Intrinsic Amplitude: 11.7 mV
MDC IDC LEAD LOCATION: 753860
MDC IDC LEAD MODEL: 181
MDC IDC LEAD SERIAL: 333140
MDC IDC MSMT BATTERY REMAINING LONGEVITY: 97 mo
MDC IDC MSMT LEADCHNL RV PACING THRESHOLD AMPLITUDE: 0.75 V
MDC IDC PG SERIAL: 7308468
MDC IDC SET LEADCHNL RV PACING AMPLITUDE: 2.5 V
MDC IDC SET LEADCHNL RV PACING PULSEWIDTH: 0.5 ms
MDC IDC SET LEADCHNL RV SENSING SENSITIVITY: 0.5 mV
MDC IDC STAT BRADY RV PERCENT PACED: 1.5 %

## 2016-01-15 DIAGNOSIS — M545 Low back pain: Secondary | ICD-10-CM | POA: Diagnosis not present

## 2016-02-04 ENCOUNTER — Ambulatory Visit (INDEPENDENT_AMBULATORY_CARE_PROVIDER_SITE_OTHER): Payer: Medicare Other | Admitting: *Deleted

## 2016-02-04 DIAGNOSIS — I255 Ischemic cardiomyopathy: Secondary | ICD-10-CM

## 2016-02-04 DIAGNOSIS — I472 Ventricular tachycardia, unspecified: Secondary | ICD-10-CM

## 2016-02-04 DIAGNOSIS — Z95810 Presence of automatic (implantable) cardiac defibrillator: Secondary | ICD-10-CM

## 2016-02-04 NOTE — Progress Notes (Signed)
Remote ICD transmission.   

## 2016-02-05 ENCOUNTER — Encounter: Payer: Self-pay | Admitting: Cardiology

## 2016-02-11 LAB — CUP PACEART REMOTE DEVICE CHECK
Battery Remaining Percentage: 92 %
HIGH POWER IMPEDANCE MEASURED VALUE: 68 Ohm
HighPow Impedance: 68 Ohm
Lead Channel Impedance Value: 350 Ohm
Lead Channel Pacing Threshold Pulse Width: 0.5 ms
Lead Channel Sensing Intrinsic Amplitude: 10.4 mV
Lead Channel Setting Pacing Amplitude: 2.5 V
Lead Channel Setting Pacing Pulse Width: 0.5 ms
MDC IDC LEAD IMPLANT DT: 20161031
MDC IDC LEAD LOCATION: 753860
MDC IDC LEAD MODEL: 181
MDC IDC LEAD SERIAL: 333140
MDC IDC MSMT BATTERY REMAINING LONGEVITY: 96 mo
MDC IDC MSMT BATTERY VOLTAGE: 3.17 V
MDC IDC MSMT LEADCHNL RV PACING THRESHOLD AMPLITUDE: 0.75 V
MDC IDC PG SERIAL: 7308468
MDC IDC SESS DTM: 20170808060019
MDC IDC SET LEADCHNL RV SENSING SENSITIVITY: 0.5 mV
MDC IDC STAT BRADY RV PERCENT PACED: 1.1 %

## 2016-03-20 ENCOUNTER — Encounter: Payer: Self-pay | Admitting: Internal Medicine

## 2016-04-27 ENCOUNTER — Other Ambulatory Visit: Payer: Self-pay | Admitting: Cardiovascular Disease

## 2016-04-28 NOTE — Telephone Encounter (Signed)
Rx(s) sent to pharmacy electronically.  

## 2016-05-05 ENCOUNTER — Encounter: Payer: Medicare Other | Admitting: Internal Medicine

## 2016-06-11 ENCOUNTER — Encounter: Payer: Self-pay | Admitting: Internal Medicine

## 2016-06-23 ENCOUNTER — Ambulatory Visit (INDEPENDENT_AMBULATORY_CARE_PROVIDER_SITE_OTHER): Payer: Medicare Other | Admitting: Internal Medicine

## 2016-06-23 VITALS — BP 112/64 | HR 44 | Ht 68.0 in | Wt 190.0 lb

## 2016-06-23 DIAGNOSIS — Z95810 Presence of automatic (implantable) cardiac defibrillator: Secondary | ICD-10-CM

## 2016-06-23 DIAGNOSIS — I472 Ventricular tachycardia, unspecified: Secondary | ICD-10-CM

## 2016-06-23 DIAGNOSIS — I255 Ischemic cardiomyopathy: Secondary | ICD-10-CM | POA: Diagnosis not present

## 2016-06-23 DIAGNOSIS — I2589 Other forms of chronic ischemic heart disease: Secondary | ICD-10-CM | POA: Diagnosis not present

## 2016-06-23 LAB — CUP PACEART INCLINIC DEVICE CHECK
Date Time Interrogation Session: 20171226134746
HighPow Impedance: 66 Ohm
Implantable Lead Implant Date: 20161031
Implantable Lead Serial Number: 333140
Implantable Pulse Generator Implant Date: 20161031
Lead Channel Impedance Value: 360 Ohm
Lead Channel Pacing Threshold Amplitude: 1 V
Lead Channel Pacing Threshold Pulse Width: 0.5 ms
Lead Channel Sensing Intrinsic Amplitude: 11.7 mV
MDC IDC LEAD LOCATION: 753860
MDC IDC LEAD MODEL: 181
MDC IDC PG SERIAL: 7308468
MDC IDC SET LEADCHNL RV PACING AMPLITUDE: 2.5 V
MDC IDC SET LEADCHNL RV PACING PULSEWIDTH: 0.5 ms
MDC IDC SET LEADCHNL RV SENSING SENSITIVITY: 0.5 mV
MDC IDC STAT BRADY RV PERCENT PACED: 1.1 %

## 2016-06-23 NOTE — Progress Notes (Signed)
Patient Care Team: Garner Gavel, MD as PCP - General (Family Medicine)   HPI  Victor Estes is a 70 y.o. male Seen in follow-up for ICD implantation 10/16 for hemodynamically unstable ventricular tachycardia in the setting of coronary artery disease and prior bypass surgery. Catheterization demonstrated severe three-vessel disease and graft disease but not candidate for revascularization. Ejection fraction was 35-40%.  He was started on amiodarone. This has been stopped  He underwent single- ICD implantation.  He is back to work. notes no chest pain no shortness of breath;  He has slow heart rate but this is the target per Dr Hiram Comber     Past Medical History:  Diagnosis Date  . Cancer ([place])   . Carotid artery disease ([place])   . GERD (gastroesophageal reflux disease)   . Hyperlipemia   . Hypertension    dr berry  . Myocardial infarct    CABG-1995- LIMA-D1-LAD, SVG-OM, SVG-RCA   . Ventricular tachycardia, sustained ([place]) 04/27/2015    Past Surgical History:  Procedure Laterality Date  . BACK SURGERY    . CARDIAC CATHETERIZATION N/A 04/27/2015   Procedure: Left Heart Cath and Coronary Angiography;  Surgeon: Sherren Mocha, MD;  Location: [facility];  Service: Cardiovascular;  Laterality: N/A;  . CARDIAC SURGERY    . CHOLECYSTECTOMY    . COLON SURGERY    . CORONARY ARTERY BYPASS GRAFT     x4    dr Redmond Pulling  . ELBOW SURGERY    . EP IMPLANTABLE DEVICE N/A 04/29/2015   Procedure: ICD Implant;  Surgeon: Deboraha Sprang, MD;  Location: [facility];  Service: Cardiovascular;  Laterality: N/A; St Jude ICD, serial number [id number].  [name] LUMBAR LAMINECTOMY/DECOMPRESSION MICRODISCECTOMY  09/09/2011   Procedure: LUMBAR LAMINECTOMY/DECOMPRESSION MICRODISCECTOMY 1 LEVEL;  Surgeon: Eustace Moore, MD;  Location: [facility];  Service: Neurosurgery;  Laterality: Right;  Right Lumbar Five-Sacral One Hemilaminectomy   . TOTAL KNEE ARTHROPLASTY Left 07/26/2013   Procedure:  TOTAL KNEE ARTHROPLASTY;  Surgeon: Ninetta Lights, MD;  Location: [facility];  Service: Orthopedics;  Laterality: Left;    Current Outpatient Prescriptions  Medication Sig Dispense Refill  . acetaminophen (TYLENOL) 325 MG tablet Take 650 mg by mouth every 6 (six) hours as needed for mild pain, moderate pain, fever or headache.    [name] amLODipine (NORVASC) 5 MG tablet TAKE 1 TABLET(5 MG) BY MOUTH DAILY 30 tablet 11  . aspirin EC 81 MG EC tablet Take 1 tablet (81 mg total) by mouth daily with breakfast.    . atorvastatin (LIPITOR) 40 MG tablet Take 1 tablet (40 mg total) by mouth at bedtime. Or at supper 30 tablet 6  . carvedilol (COREG) 6.25 MG tablet Take 1 tablet (6.25 mg total) by mouth 2 (two) times daily with a meal. 60 tablet 6  . Cholecalciferol 2000 UNITS TABS Take 2,000 Units by mouth daily.    . clopidogrel (PLAVIX) 75 MG tablet Take 1 tablet (75 mg total) by mouth daily. PLEASE CONTACT OFFICE FOR ADDITIONAL REFILLS 90 tablet 0  . lisinopril (PRINIVIL,ZESTRIL) 20 MG tablet Take 1 tablet (20 mg total) by mouth every morning. 30 tablet 6  . methocarbamol (ROBAXIN) 500 MG tablet Take 1 tablet (500 mg total) by mouth every 6 (six) hours as needed for muscle spasms. 90 tablet 1  . pantoprazole (PROTONIX) 40 MG tablet Take 40 mg by mouth 2 (two) times daily.     . tamsulosin (FLOMAX) 0.4 MG  CAPS capsule Take 0.4 mg by mouth daily after breakfast.    . traMADol (ULTRAM) 50 MG tablet Take 50 mg by mouth every 6 (six) hours as needed for moderate pain or severe pain.    [name] ZETIA 10 MG tablet Take 1 tablet (10 mg total) by mouth daily. PLEASE CONTACT OFFICE FOR ADDITIONAL REFILLS 90 tablet 0   No current facility-administered medications for this visit.     Allergies  Allergen Reactions  . Clarithromycin Swelling      Review of Systems negative except from HPI and PMH  Physical Exam BP 112/64   Pulse (!) 44   Ht 5\' 8"  (1.727 m)   Wt 190 lb (86.2 kg)   BMI 28.89 kg/m  Well developed and  well nourished in no acute distress HENT normal E scleral and icterus clear Neck Supple JVP flat; carotids brisk and full Clear to ausculation Device pocket well healed; without hematoma or erythema.  There is no tethering  Regular rate and rhythm, no murmurs gallops or rub Soft with active bowel sounds No clubbing cyanosis  Edema Alert and oriented, grossly normal motor and sensory function Skin Warm and Dry   ECG 26 January demonstrated sinus rhythm at 44 Intervals 29/10/48  Assessment and  Plan  Ventricular tachycardia    Implantable defibrillator- The patient's device was interrogated.  The information was reviewed. No changes were made in the programming.     Ischemic cardiomyopathy with prior bypass surgery   Sinus bradycardia   No intercurrent Ventricular tachycardia  Without symptoms of ischemia  Overall the patient is doing very well. Device function is normal. We will continue current medications. His bradycardia appears to be asymptomatic.   Will defer med changes to dr Hiram Comber

## 2016-06-23 NOTE — Patient Instructions (Addendum)
Medication Instructions: - Your physician recommends that you continue on your current medications as directed. Please refer to the Current Medication list given to you today.  Labwork: - none ordered  Procedures/Testing: - none ordered  Follow-Up: - Remote monitoring is used to monitor your Pacemaker of ICD from home. This monitoring reduces the number of office visits required to check your device to one time per year. It allows [place] to keep an eye on the functioning of your device to ensure it is working properly. You are scheduled for a device check from home on 09/22/16. You may send your transmission at any time that day. If you have a wireless device, the transmission will be sent automatically. After your physician reviews your transmission, you will receive a postcard with your next transmission date.  - Your physician wants you to follow-up in: 1 year with Tommye Standard, PA for Dr. Caryl Comes. You will receive a reminder letter in the mail two months in advance. If you don't receive a letter, please call our office to schedule the follow-up appointment.  Any Additional Special Instructions Will Be Listed Below (If Applicable).     If you need a refill on your cardiac medications before your next appointment, please call your pharmacy.

## 2016-06-26 ENCOUNTER — Other Ambulatory Visit: Payer: Self-pay | Admitting: Cardiovascular Disease

## 2016-08-07 ENCOUNTER — Other Ambulatory Visit: Payer: Self-pay | Admitting: Cardiovascular Disease

## 2016-09-22 ENCOUNTER — Ambulatory Visit (INDEPENDENT_AMBULATORY_CARE_PROVIDER_SITE_OTHER): Payer: Medicare Other | Admitting: *Deleted

## 2016-09-22 DIAGNOSIS — I472 Ventricular tachycardia, unspecified: Secondary | ICD-10-CM

## 2016-09-23 LAB — CUP PACEART REMOTE DEVICE CHECK
Battery Remaining Longevity: 92 mo
Battery Voltage: 3.07 V
Brady Statistic RV Percent Paced: 1 %
Date Time Interrogation Session: 20180327060021
HighPow Impedance: 65 Ohm
HighPow Impedance: 65 Ohm
Implantable Lead Model: 181
Lead Channel Impedance Value: 340 Ohm
Lead Channel Pacing Threshold Pulse Width: 0.5 ms
Lead Channel Setting Pacing Amplitude: 2.5 V
Lead Channel Setting Pacing Pulse Width: 0.5 ms
MDC IDC LEAD IMPLANT DT: 20161031
MDC IDC LEAD LOCATION: 753860
MDC IDC LEAD SERIAL: 333140
MDC IDC MSMT BATTERY REMAINING PERCENTAGE: 88 %
MDC IDC MSMT LEADCHNL RV PACING THRESHOLD AMPLITUDE: 1 V
MDC IDC MSMT LEADCHNL RV SENSING INTR AMPL: 10.7 mV
MDC IDC PG IMPLANT DT: 20161031
MDC IDC PG SERIAL: 7308468
MDC IDC SET LEADCHNL RV SENSING SENSITIVITY: 0.5 mV

## 2016-09-23 NOTE — Progress Notes (Signed)
Remote ICD transmission.   

## 2016-09-25 ENCOUNTER — Encounter: Payer: Self-pay | Admitting: Cardiology

## 2016-10-12 ENCOUNTER — Other Ambulatory Visit: Payer: Self-pay | Admitting: Cardiovascular Disease

## 2016-10-20 ENCOUNTER — Other Ambulatory Visit: Payer: Self-pay

## 2016-10-20 MED ORDER — EZETIMIBE 10 MG PO TABS: 10.0000 mg | ORAL_TABLET | Freq: Every day | ORAL | Status: DC

## 2016-11-26 DIAGNOSIS — M7581 Other shoulder lesions, right shoulder: Secondary | ICD-10-CM | POA: Diagnosis not present

## 2016-12-22 ENCOUNTER — Ambulatory Visit (INDEPENDENT_AMBULATORY_CARE_PROVIDER_SITE_OTHER): Payer: Medicare Other | Admitting: *Deleted

## 2016-12-22 DIAGNOSIS — I472 Ventricular tachycardia, unspecified: Secondary | ICD-10-CM

## 2016-12-22 NOTE — Progress Notes (Signed)
Remote ICD transmission.   

## 2016-12-23 ENCOUNTER — Encounter: Payer: Self-pay | Admitting: Cardiology

## 2016-12-24 LAB — CUP PACEART REMOTE DEVICE CHECK
Battery Remaining Longevity: 89 mo
Brady Statistic RV Percent Paced: 1.2 %
HIGH POWER IMPEDANCE MEASURED VALUE: 65 Ohm
HighPow Impedance: 65 Ohm
Implantable Lead Model: 181
Lead Channel Pacing Threshold Amplitude: 1 V
Lead Channel Setting Pacing Amplitude: 2.5 V
Lead Channel Setting Pacing Pulse Width: 0.5 ms
Lead Channel Setting Sensing Sensitivity: 0.5 mV
MDC IDC LEAD IMPLANT DT: 20161031
MDC IDC LEAD LOCATION: 753860
MDC IDC LEAD SERIAL: 333140
MDC IDC MSMT BATTERY REMAINING PERCENTAGE: 86 %
MDC IDC MSMT BATTERY VOLTAGE: 3.04 V
MDC IDC MSMT LEADCHNL RV IMPEDANCE VALUE: 340 Ohm
MDC IDC MSMT LEADCHNL RV PACING THRESHOLD PULSEWIDTH: 0.5 ms
MDC IDC MSMT LEADCHNL RV SENSING INTR AMPL: 11.7 mV
MDC IDC PG IMPLANT DT: 20161031
MDC IDC PG SERIAL: 7308468
MDC IDC SESS DTM: 20180626060025

## 2017-03-23 ENCOUNTER — Ambulatory Visit (INDEPENDENT_AMBULATORY_CARE_PROVIDER_SITE_OTHER): Payer: Medicare Other | Admitting: *Deleted

## 2017-03-23 DIAGNOSIS — I472 Ventricular tachycardia, unspecified: Secondary | ICD-10-CM

## 2017-03-23 NOTE — Progress Notes (Signed)
Remote ICD transmission.   

## 2017-03-25 ENCOUNTER — Encounter: Payer: Self-pay | Admitting: Cardiology

## 2017-03-26 LAB — CUP PACEART REMOTE DEVICE CHECK
Battery Remaining Longevity: 88 mo
Battery Voltage: 3.02 V
Date Time Interrogation Session: 20180925060017
HIGH POWER IMPEDANCE MEASURED VALUE: 64 Ohm
HighPow Impedance: 64 Ohm
Implantable Lead Implant Date: 20161031
Implantable Lead Serial Number: 333140
Implantable Pulse Generator Implant Date: 20161031
Lead Channel Pacing Threshold Amplitude: 1 V
Lead Channel Sensing Intrinsic Amplitude: 10.2 mV
Lead Channel Setting Pacing Amplitude: 2.5 V
MDC IDC LEAD LOCATION: 753860
MDC IDC MSMT BATTERY REMAINING PERCENTAGE: 85 %
MDC IDC MSMT LEADCHNL RV IMPEDANCE VALUE: 330 Ohm
MDC IDC MSMT LEADCHNL RV PACING THRESHOLD PULSEWIDTH: 0.5 ms
MDC IDC SET LEADCHNL RV PACING PULSEWIDTH: 0.5 ms
MDC IDC SET LEADCHNL RV SENSING SENSITIVITY: 0.5 mV
MDC IDC STAT BRADY RV PERCENT PACED: 1.2 %
Pulse Gen Serial Number: 7308468

## 2017-04-12 DIAGNOSIS — M7581 Other shoulder lesions, right shoulder: Secondary | ICD-10-CM | POA: Diagnosis not present

## 2017-04-27 ENCOUNTER — Other Ambulatory Visit: Payer: Self-pay | Admitting: Orthopedic Surgery

## 2017-04-27 DIAGNOSIS — M25511 Pain in right shoulder: Secondary | ICD-10-CM

## 2017-04-27 DIAGNOSIS — M7581 Other shoulder lesions, right shoulder: Secondary | ICD-10-CM | POA: Diagnosis not present

## 2017-04-29 ENCOUNTER — Telehealth: Payer: Self-pay

## 2017-04-29 ENCOUNTER — Other Ambulatory Visit: Payer: Self-pay | Admitting: Orthopedic Surgery

## 2017-04-29 DIAGNOSIS — M25511 Pain in right shoulder: Secondary | ICD-10-CM

## 2017-04-29 NOTE — Telephone Encounter (Signed)
      [facility] Pre-operative Risk Assessment    Request for surgical clearance:  1. What type of surgery is being performed? CT Arthrogram right shoulder   2. When is this surgery scheduled? Will schedule after    3. Are there any medications that need to be held prior to surgery and how long? Plavix for 5 days   4. Practice name and name of physician performing surgery? [facility]    5. What is your office phone and fax number? [phone] fx [phone]   6. Anesthesia type (None, local, MAC, general) ? Not listed   Victor Estes 04/29/2017, 4:57 PM  _________________________________________________________________   (provider comments below)

## 2017-04-29 NOTE — Telephone Encounter (Signed)
    Chart reviewed as part of pre-operative protocol coverage. Because of Victor Estes's past medical history and time since last visit, he/she will require a follow-up visit in order to better assess preoperative cardiovascular risk. Last OV 05/2016.  Pre-op covering staff: - Please schedule appointment and call patient to inform them. - Please contact requesting surgeon's office via preferred method (i.e, phone, fax) to inform them of need for appointment prior to surgery.  [name], PA-C  04/29/2017, 5:07 PM

## 2017-04-30 NOTE — Telephone Encounter (Signed)
lmtcb

## 2017-05-03 NOTE — Telephone Encounter (Signed)
Follow up   Pt is returning call    

## 2017-05-03 NOTE — Telephone Encounter (Signed)
Unable to leave a message, voicemail full.  

## 2017-05-06 NOTE — Telephone Encounter (Signed)
Spoke with pt re: cardiac clearance and explained that he would need an appt before he could be cleared. Pt agreed and has been scheduled to see Rosaria Ferries, PA-C, 05/11/17. Pt thanked me for the call.

## 2017-05-10 ENCOUNTER — Telehealth: Payer: Self-pay | Admitting: Cardiovascular Disease

## 2017-05-10 NOTE — Progress Notes (Signed)
Cardiology Office Note   Date:  05/11/2017   ID:  Victor Estes, DOB [birth year], MRN [id number]  PCP:  Garner Gavel, MD  Cardiologist: Dr Gwenlyn Found, 08/2015;  Dr Caryl Comes, 06/23/2016  Rosaria Ferries, PA-C   Chief Complaint  Patient presents with  . Follow-up    History of Present Illness: Victor Estes is a 71 y.o. male with a history of ICD 03/2016 for VT, CABG x 4 1995, tob use, HTN, HLD,  EF 40-45% by echo 2014, VT 2016 2nd scar>>St Jude ICD, cath 2016 w/ severe native disease & patent LIMA-LAD and SVG-OM, SVG-RCA chronically occluded>>no PCI, GERD, CA, sinus brady  Victor Estes presents for pre-op evaluation for CT arthrogram R shoulder. Plavix to be held for 5 days.  Victor Estes was supposed to have an MRI of his shoulder, but cannot because of his ICD. Ergo, the arthrogram.  He struggles a little crawling under houses due to panic attacks, but otherwise is not having any chest pain or SOB. 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 No problems walking up steps, has had to go from the street to the top of a 3 story house. Is a little SOB at the top, but does not have to stop. He also plays golf.   He has not had chest pain. His main symptom in 2016, was SOB. He has not had any sx like that. He never feels his heart skip or race. He has not had presyncope or syncope. On last ICD transmission 09/25, he had had an 11 bt run VT, no sx. Rarely paces. He does not have sx related to low HR, tolerates the Coreg well. Baseline HR high 40s and 50s.    Past Medical History:  Diagnosis Date  . Cancer ([place])   . Carotid artery disease ([place])   . GERD (gastroesophageal reflux disease)   . Hyperlipemia   . Hypertension    Dr Gwenlyn Found  . Myocardial infarct ([place])    CABG-1995- LIMA-D1-LAD, SVG-OM, SVG-RCA   . Ventricular tachycardia, sustained ([place]) 04/27/2015    Past Surgical History:  Procedure Laterality Date  . BACK SURGERY    .  CHOLECYSTECTOMY    . COLON SURGERY    . CORONARY ARTERY BYPASS GRAFT  1995    LIMA-D1-LAD, SVG-OM, SVG-RCA, Dr Redmond Pulling  . ELBOW SURGERY      Current Outpatient Medications  Medication Sig Dispense Refill  . acetaminophen (TYLENOL) 325 MG tablet Take 650 mg by mouth every 6 (six) hours as needed for mild pain, moderate pain, fever or headache.    [name] amLODipine (NORVASC) 5 MG tablet TAKE 1 TABLET(5 MG) BY MOUTH DAILY 30 tablet 11  . aspirin EC 81 MG EC tablet Take 1 tablet (81 mg total) by mouth daily with breakfast.    . atorvastatin (LIPITOR) 40 MG tablet Take 1 tablet (40 mg total) by mouth at bedtime. Or at supper 30 tablet 6  . carvedilol (COREG) 6.25 MG tablet Take 1 tablet (6.25 mg total) by mouth 2 (two) times daily with a meal. 60 tablet 6  . Cholecalciferol 2000 UNITS TABS Take 2,000 Units by mouth daily.    . clopidogrel (PLAVIX) 75 MG tablet TAKE 1 TABLET(75 MG) BY MOUTH DAILY 10 tablet 0  . ezetimibe (ZETIA) 10 MG tablet Take 1 tablet (10 mg total) by mouth daily. 90 tablet o  . lisinopril (PRINIVIL,ZESTRIL) 20 MG tablet Take 1 tablet (20 mg total) by mouth  every morning. 30 tablet 6  . methocarbamol (ROBAXIN) 500 MG tablet Take 1 tablet (500 mg total) by mouth every 6 (six) hours as needed for muscle spasms. 90 tablet 1  . pantoprazole (PROTONIX) 40 MG tablet Take 40 mg by mouth 2 (two) times daily.     . tamsulosin (FLOMAX) 0.4 MG CAPS capsule Take 0.4 mg by mouth daily after breakfast.    . traMADol (ULTRAM) 50 MG tablet Take 50 mg by mouth every 6 (six) hours as needed for moderate pain or severe pain.     No current facility-administered medications for this visit.     Allergies:   Clarithromycin    Social History:  The patient  reports that he quit smoking about 34 years ago. he has never used smokeless tobacco. He reports that he does not drink alcohol or use drugs.   Family History:  The patient's family history includes Heart failure in his mother; Hypertension in his  mother and other.    ROS:  Please see the history of present illness. All other systems are reviewed and negative.    PHYSICAL EXAM: VS:  BP 111/64   Pulse (!) 52   Ht 5\' 8"  (1.727 m)   Wt 195 lb (88.5 kg)   BMI 29.65 kg/m  , BMI Body mass index is 29.65 kg/m. GEN: Well nourished, well developed, male in no acute distress  HEENT: normal for age  Neck: no JVD, R>L carotid bruits, no masses Cardiac: RRR; 2/6 murmur, no rubs, or gallops Respiratory:  clear to auscultation bilaterally, normal work of breathing GI: soft, nontender, nondistended, + BS MS: no deformity or atrophy; no edema; distal pulses are 2+ in all 4 extremities   Skin: warm and dry, no rash Neuro:  Strength and sensation are intact Psych: euthymic mood, full affect   EKG:  EKG is ordered today. The ekg ordered today demonstrates sinus bradycardia, heart rate 52 with first-degree AV block with PR interval 328 ms  ECHO: 04/30/2015 - Left ventricle: There is basal and mid inferior and inferolateral   hypokinesis. The cavity size was normal. Systolic function was   mildly to moderately reduced. The estimated ejection fraction was   in the range of 40% to 45%. Wall motion was normal; there were no   regional wall motion abnormalities. Features are consistent with   a pseudonormal left ventricular filling pattern, with concomitant   abnormal relaxation and increased filling pressure (grade 2   diastolic dysfunction). - Aortic valve: There was mild regurgitation. - Aortic root: The aortic root was normal in size. - Mitral valve: Structurally normal valve. There was mild   regurgitation. - Left atrium: The atrium was mildly dilated. - Right ventricle: Pacer wire or catheter noted in right ventricle. - Right atrium: Pacer wire or catheter noted in right atrium. - Tricuspid valve: There was mild regurgitation. - Inferior vena cava: The vessel was normal in size. - Pericardium, extracardiac: There was no pericardial  effusion.  CARDIAC CATH: 04/27/2015  LIMA .  The LIMA sequenced to the first diagonal and mid LAD. The graft is widely patent with no obstruction.  SVG .  The saphenous vein graft to first OM is widely patent. The OM target is a large caliber vessel that is totally occluded before the graft insertion, but the native vessel is widely patent beyond the graft insertion site.  Ost 1st Mrg to 1st Mrg lesion, 100% stenosed.  SVG .  The saphenous vein graft, presumably to distal  RCA, 100% occluded at the aortic anastomosis  Origin lesion, 100% stenosed.  LM lesion, 80% stenosed.  Ost Cx lesion, 90% stenosed.  Prox LAD lesion, 90% stenosed.  Ost RCA lesion, 95% stenosed.  Prox RCA lesion, 90% stenosed.  Mid RCA lesion, 80% stenosed.  There is moderate to severe left ventricular systolic dysfunction.  1. Severe native multivessel coronary artery disease with severe left main stenosis, proximal LAD stenosis, proximal left circumflex stenosis, and severe diffuse RCA stenosis  2. Status post remote aortocoronary bypass surgery with continued patency of the sequential LIMA graft to the first diagonal and LAD, and continued patency of the saphenous vein graft obtuse marginal.  3. Likely chronic occlusion of the saphenous vein graft RCA  4. Moderately severe LV segmental dysfunction consistent with this patient's known history of old inferior wall MI  I do not see any targets for PCI, nor do I see evidence of an acute plaque rupture or culprit vessel. I suspect this patient had scar mediated ventricular tachycardia. We'll keep him on IV amiodarone and place an EP consult for consideration of an ICD for secondary prevention of sudden cardiac death.    Recent Labs: No results found for requested labs within last 8760 hours.    Lipid Panel    Wt Readings from Last 3 Encounters:  05/11/17 195 lb (88.5 kg)  06/23/16 190 lb (86.2 kg)  09/04/15 193 lb 12.8 oz (87.9 kg)      Other studies Reviewed: Additional studies/ records that were reviewed today include: Office notes, hospital records and testing.  ASSESSMENT AND PLAN:  1.  Preoperative evaluation: Good activity level with no ischemic symptoms.  On his last cath, medical therapy was the best option with no lesions amenable to PCI.  He is at acceptable risk for the planned procedure and any subsequent shoulder surgery needed with no additional cardiac workup.  2.  CAD/ischemic cardiomyopathy: He is on aspirin, Plavix, beta-blocker ACE inhibitor, and statin.  3.  Hypertension: Blood pressures under good control, continue current therapy  4.  Hyperlipidemia: His cholesterol is [facility] by the [facility].  He will obtain his most recent lipid profile and get it to [place]  5. Carotid Disease: no Dopplers since 2016, supposed to be checked yearly. Will schedule.   Current medicines are reviewed at length with the patient today.  The patient does not have concerns regarding medicines.  The following changes have been made:  no change  Labs/ tests ordered today include:   Orders Placed This Encounter  Procedures  . EKG 12-Lead     Disposition:   FU with Dr. Gwenlyn Found and Dr. Caryl Comes as scheduled  Signed, Lenoard Aden  05/11/2017 9:58 AM    [place] Phone: [phone]; Fax: [phone]  This note was written with the assistance of speech recognition software. Please excuse any transcriptional errors.

## 2017-05-10 NOTE — Telephone Encounter (Signed)
    Chart reviewed as part of pre-operative protocol coverage. Because of Victor Estes's past medical history and time since last visit, Victor Estes/she will require a follow-up visit in order to better assess preoperative cardiovascular risk. Their primary cardiologist is Dr. Gwenlyn Found. Electrophysiologist is Dr. Caryl Comes. Victor Estes was last seen by Dr. Gwenlyn Found in 08/2015 with recommendation to f/u in 02/2016 but do not see this was completed. Victor Estes has not been seen by Dr. Gwenlyn Found nor Dr. Caryl Comes in 2018. Victor Estes will therefore need an appointment before clearing to come off Plavix.   Pre-op covering staff: - Please schedule appointment and call patient to inform them (I see appt made for tomorrow but do not see phone note about this so I'm not sure if patient is aware) - Please contact requesting surgeon's office via preferred method (i.e, phone, fax) to inform them of need for appointment prior to surgery.  [name], PA-C  05/10/2017, 3:39 PM

## 2017-05-10 NOTE — Telephone Encounter (Signed)
New message        [facility] Pre-operative Risk Assessment    Request for surgical clearance:  1. What type of surgery is being performed? Right Shoulder injection   2. When is this surgery scheduled? Pending   3. Are there any medications that need to be held prior to surgery and how long?clopidogrel (PLAVIX) 75 MG tablet  4. Practice name and name of physician performing surgery? [facility]  Dr. Noemi Chapel   5. What is your office phone and fax number? [phone] / fax  fax [phone]  6. Anesthesia type (None, local, MAC, general) ? Injection    Marcine Matar 05/10/2017, 11:15 AM  _________________________________________________________________   (provider comments below)

## 2017-05-10 NOTE — Telephone Encounter (Signed)
Called to let pt know that he needs an appt before he could be cleared. Pt already scheduled for 05/11/17. Pt thanked me for calling.

## 2017-05-11 ENCOUNTER — Ambulatory Visit (INDEPENDENT_AMBULATORY_CARE_PROVIDER_SITE_OTHER): Payer: Medicare Other | Admitting: Physician Assistant

## 2017-05-11 ENCOUNTER — Encounter: Payer: Self-pay | Admitting: Physician Assistant

## 2017-05-11 VITALS — BP 111/64 | HR 52 | Ht 68.0 in | Wt 195.0 lb

## 2017-05-11 DIAGNOSIS — I10 Essential (primary) hypertension: Secondary | ICD-10-CM | POA: Diagnosis not present

## 2017-05-11 DIAGNOSIS — Z951 Presence of aortocoronary bypass graft: Secondary | ICD-10-CM | POA: Diagnosis not present

## 2017-05-11 DIAGNOSIS — I779 Disorder of arteries and arterioles, unspecified: Secondary | ICD-10-CM

## 2017-05-11 DIAGNOSIS — Z01818 Encounter for other preprocedural examination: Secondary | ICD-10-CM

## 2017-05-11 DIAGNOSIS — E785 Hyperlipidemia, unspecified: Secondary | ICD-10-CM | POA: Diagnosis not present

## 2017-05-11 DIAGNOSIS — I472 Ventricular tachycardia, unspecified: Secondary | ICD-10-CM

## 2017-05-11 DIAGNOSIS — I739 Peripheral vascular disease, unspecified: Secondary | ICD-10-CM

## 2017-05-11 NOTE — Patient Instructions (Signed)
Medication Instructions:  Continue current medications  If you need a refill on your cardiac medications before your next appointment, please call your pharmacy.  Labwork: None Ordered   Testing/Procedures: Your physician has requested that you have a carotid duplex. This test is an ultrasound of the carotid arteries in your neck. It looks at blood flow through these arteries that supply the brain with blood. Allow one hour for this exam. There are no restrictions or special instructions.   Follow-Up: Your physician wants you to follow-up in: March with Dr Gwenlyn Found. You should receive a reminder letter in the mail two months in advance. If you do not receive a letter, please call our office [phone].    Thank you for choosing [facility] at [facility]!!

## 2017-05-17 ENCOUNTER — Encounter: Payer: Self-pay | Admitting: Cardiovascular Disease

## 2017-05-17 ENCOUNTER — Telehealth: Payer: Self-pay | Admitting: Cardiovascular Disease

## 2017-05-17 NOTE — Telephone Encounter (Signed)
Faxing last ov note with Suanne Marker to [place] at [phone].

## 2017-05-17 NOTE — Telephone Encounter (Signed)
Pt calling regarding having pre-op visit 11-13 and Dr. Noemi Chapel needs to do an injection at [facility] and they are waiting on an ok to do it, per pt Rhonda cleared him, but states no one was called for clearance

## 2017-05-18 NOTE — Telephone Encounter (Signed)
It is ok to hold Plavix for 5 days preop, resume after surgery when ok with Dr Noemi Chapel.

## 2017-05-18 NOTE — Telephone Encounter (Signed)
Faxed to provided number  

## 2017-05-18 NOTE — Telephone Encounter (Signed)
New message      Fax surgical clearance to : Attn pamela  [phone]   Needs it today so that they can schedule surgery for next week ,    Need written instructions stating how long to hold Plavix , can not use office notes.

## 2017-05-28 ENCOUNTER — Ambulatory Visit
Admission: RE | Admit: 2017-05-28 | Discharge: 2017-05-28 | Disposition: A | Discharge status: Home / Self Care | Payer: Medicare Other | Source: Ambulatory Visit | Attending: Orthopedic Surgery | Admitting: Orthopedic Surgery

## 2017-05-28 DIAGNOSIS — M25511 Pain in right shoulder: Secondary | ICD-10-CM | POA: Diagnosis not present

## 2017-05-28 DIAGNOSIS — M19011 Primary osteoarthritis, right shoulder: Secondary | ICD-10-CM | POA: Diagnosis not present

## 2017-05-28 MED ORDER — IOPAMIDOL (ISOVUE-M 200) INJECTION 41%
15.0000 mL | Freq: Once | INTRAMUSCULAR | Status: AC
  Administered 2017-05-28: 15 mL via INTRA_ARTICULAR

## 2017-06-08 ENCOUNTER — Inpatient Hospital Stay (HOSPITAL_COMMUNITY)
Admission: RE | Admit: 2017-06-08 | Payer: Medicare Other | Source: Ambulatory Visit | Attending: Physician Assistant | Admitting: Physician Assistant

## 2017-06-09 ENCOUNTER — Other Ambulatory Visit: Payer: Self-pay | Admitting: Cardiovascular Disease

## 2017-06-24 ENCOUNTER — Ambulatory Visit (INDEPENDENT_AMBULATORY_CARE_PROVIDER_SITE_OTHER): Payer: Medicare Other | Admitting: *Deleted

## 2017-06-24 ENCOUNTER — Ambulatory Visit (HOSPITAL_COMMUNITY)
Admission: RE | Admit: 2017-06-24 | Payer: Medicare Other | Source: Ambulatory Visit | Attending: Physician Assistant | Admitting: Physician Assistant

## 2017-06-24 ENCOUNTER — Telehealth: Payer: Self-pay | Admitting: Cardiology

## 2017-06-24 DIAGNOSIS — I255 Ischemic cardiomyopathy: Secondary | ICD-10-CM | POA: Diagnosis not present

## 2017-06-24 NOTE — Telephone Encounter (Signed)
Spoke with pt and reminded pt of remote transmission that is due today. Pt verbalized understanding.   

## 2017-06-25 ENCOUNTER — Encounter: Payer: Self-pay | Admitting: Cardiology

## 2017-06-25 NOTE — Progress Notes (Signed)
Remote ICD transmission.   

## 2017-06-27 NOTE — Progress Notes (Signed)
Cardiology Office Note Date:  06/28/2017  Patient ID:  Victor Estes, DOB [birth year], MRN [id number] PCP:  Garner Gavel, MD  Cardiologist:  Dr. Gwenlyn Found Electrophysiologist: Dr. Caryl Comes    Chief Complaint: annual EP/device visit  History of Present Illness: Victor Estes is a 71 y.o. male with history of CAD, MI, (CABG 1995), VT w/ICD, HTN, HLD.  He comes today to be seen for Dr. Caryl Comes, last visited with him Dec 2017.  At that visit no changes were made, was doing well, device function was stable.  He is doing well.  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  He denies any kind of CP, no SOB or DOE, no difficulties with his ADLs, no shocks, no dizziness, near syncope or syncope.  Denies any symptoms of PND or orthopnea.  [facility]  He sees Dr. Gwenlyn Found soon as well.   Device information: SJM single chamber ICD, implanted 04/29/15  Past Medical History:  Diagnosis Date  . Cancer ([place])   . Carotid artery disease ([place])   . GERD (gastroesophageal reflux disease)   . Hyperlipemia   . Hypertension    Dr Gwenlyn Found  . Myocardial infarct ([place])    CABG-1995- LIMA-D1-LAD, SVG-OM, SVG-RCA   . Ventricular tachycardia, sustained ([place]) 04/27/2015    Past Surgical History:  Procedure Laterality Date  . BACK SURGERY    . CARDIAC CATHETERIZATION N/A 04/27/2015   Severe native dz, LIMA-LAD & SVG-OM OK, SVG-RCA chronically occluded. Left Heart Cath and Coronary Angiography;  Surgeon: Sherren Mocha, MD;  Location: [place] [facility];  Service: Cardiovascular;  Laterality: N/A;  . CHOLECYSTECTOMY    . COLON SURGERY    . CORONARY ARTERY BYPASS GRAFT  1995    LIMA-D1-LAD, SVG-OM, SVG-RCA, Dr Redmond Pulling  . ELBOW SURGERY    . EP IMPLANTABLE DEVICE N/A 04/29/2015   Procedure: ICD Implant;  Surgeon: Deboraha Sprang, MD;  Location: [place] [facility];  Service: Cardiovascular;  Laterality: N/A; St Jude ICD,  serial number [id number].  [name] LUMBAR LAMINECTOMY/DECOMPRESSION MICRODISCECTOMY  09/09/2011   Procedure: LUMBAR LAMINECTOMY/DECOMPRESSION MICRODISCECTOMY 1 LEVEL;  Surgeon: Eustace Moore, MD;  Location: [place] [facility];  Service: Neurosurgery;  Laterality: Right;  Right Lumbar Five-Sacral One Hemilaminectomy   . TOTAL KNEE ARTHROPLASTY Left 07/26/2013   Procedure: TOTAL KNEE ARTHROPLASTY;  Surgeon: Ninetta Lights, MD;  Location: [place];  Service: Orthopedics;  Laterality: Left;    Current Outpatient Medications  Medication Sig Dispense Refill  . acetaminophen (TYLENOL) 325 MG tablet Take 650 mg by mouth every 6 (six) hours as needed for mild pain, moderate pain, fever or headache.    [name] amLODipine (NORVASC) 5 MG tablet TAKE 1 TABLET(5 MG) BY MOUTH DAILY 30 tablet 11  . aspirin EC 81 MG EC tablet Take 1 tablet (81 mg total) by mouth daily with breakfast.    . atorvastatin (LIPITOR) 40 MG tablet Take 1 tablet (40 mg total) by mouth at bedtime. Or at supper 30 tablet 6  . carvedilol (COREG) 6.25 MG tablet Take 1 tablet (6.25 mg total) by mouth 2 (two) times daily with a meal. 60 tablet 6  . Cholecalciferol 2000 UNITS TABS Take 2,000 Units by mouth daily.    . clopidogrel (PLAVIX) 75 MG tablet TAKE 1 TABLET(75 MG) BY MOUTH DAILY 90 tablet 0  . ezetimibe (ZETIA) 10 MG tablet Take 1 tablet (10 mg total) by mouth daily. [place]  tablet o  . lisinopril (PRINIVIL,ZESTRIL) 20 MG tablet Take 1 tablet (20 mg total) by mouth every morning. 30 tablet 6  . methocarbamol (ROBAXIN) 500 MG tablet Take 1 tablet (500 mg total) by mouth every 6 (six) hours as needed for muscle spasms. 90 tablet 1  . pantoprazole (PROTONIX) 40 MG tablet Take 40 mg by mouth 2 (two) times daily.     . tamsulosin (FLOMAX) 0.4 MG CAPS capsule Take 0.4 mg by mouth daily after breakfast.    . traMADol (ULTRAM) 50 MG tablet Take 50 mg by mouth every 6 (six) hours as needed for moderate pain or severe pain.     No current facility-administered  medications for this visit.     Allergies:   Clarithromycin   Social History:  The patient  reports that he quit smoking about 34 years ago. he has never used smokeless tobacco. He reports that he does not drink alcohol or use drugs.   Family History:  The patient's family history includes Heart failure in his mother; Hypertension in his mother and other.  ROS:  Please see the history of present illness.  All other systems are reviewed and otherwise negative.   PHYSICAL EXAM:  VS:  BP 140/66   Pulse 65   Ht 5\' 8"  (1.727 m)   Wt 196 lb (88.9 kg)   BMI 29.80 kg/m  BMI: Body mass index is 29.8 kg/m. Well nourished, well developed, in no acute distress  HEENT: normocephalic, atraumatic  Neck: no JVD, carotid bruits or masses Cardiac:  RRR; no significant murmurs, no rubs, or gallops Lungs:  CTA b/l, no wheezing, rhonchi or rales  Abd: soft, non-tender, obese MS: no deformity or atrophy Ext: no edema  Skin: warm and dry, no rash Neuro:  No gross deficits appreciated Psych: euthymic mood, full affect  ICD site is stable, no tethering or discomfort   EKG:  Not done today ICD interrogation done today and reviewed by myself: battery and lead measurements are good, 2 NSVT episodes only, one in July, and one in October, longest 6 seconds, no other observations, cyber security upgrade is completed today  ECHO: 04/30/2015 - Left ventricle: There is basal and mid inferior and inferolateral hypokinesis. The cavity size was normal. Systolic function was mildly to moderately reduced. The estimated ejection fraction was in the range of 40% to 45%. Wall motion was normal; there were no regional wall motion abnormalities. Features are consistent with a pseudonormal left ventricular filling pattern, with concomitant abnormal relaxation and increased filling pressure (grade 2 diastolic dysfunction). - Aortic valve: There was mild regurgitation. - Aortic root: The aortic root  was normal in size. - Mitral valve: Structurally normal valve. There was mild regurgitation. - Left atrium: The atrium was mildly dilated. - Right ventricle: Pacer wire or catheter noted in right ventricle. - Right atrium: Pacer wire or catheter noted in right atrium. - Tricuspid valve: There was mild regurgitation. - Inferior vena cava: The vessel was normal in size. - Pericardium, extracardiac: There was no pericardial effusion.  CARDIAC CATH: 04/27/2015  LIMA .  The LIMA sequenced to the first diagonal and mid LAD. The graft is widely patent with no obstruction.  SVG .  The saphenous vein graft to first OM is widely patent. The OM target is a large caliber vessel that is totally occluded before the graft insertion, but the native vessel is widely patent beyond the graft insertion site.  Ost 1st Mrg to 1st Mrg lesion, 100% stenosed.  SVG .  The saphenous vein graft, presumably to distal RCA, 100% occluded at the aortic anastomosis  Origin lesion, 100% stenosed.  LM lesion, 80% stenosed.  Ost Cx lesion, 90% stenosed.  Prox LAD lesion, 90% stenosed.  Ost RCA lesion, 95% stenosed.  Prox RCA lesion, 90% stenosed.  Mid RCA lesion, 80% stenosed.  There is moderate to severe left ventricular systolic dysfunction. 1. Severe native multivessel coronary artery disease with severe left main stenosis, proximal LAD stenosis, proximal left circumflex stenosis, and severe diffuse RCA stenosis 2. Status post remote aortocoronary bypass surgery with continued patency of the sequential LIMA graft to the first diagonal and LAD, and continued patency of the saphenous vein graft obtuse marginal. 3. Likely chronic occlusion of the saphenous vein graft RCA 4. Moderately severe LV segmental dysfunction consistent with this patient's known history of old inferior wall MI *I do not see any targets for PCI, nor do I see evidence of an acute plaque rupture or culprit vessel. I suspect this  patient had scar mediated ventricular tachycardia. We'll keep him on IV amiodarone and place an EP consult for consideration of an ICD for secondary prevention of sudden cardiac death.    Recent Labs: No results found for requested labs within last 8760 hours.  No results found for requested labs within last 8760 hours.   CrCl cannot be calculated (Patient's most recent lab result is older than the maximum 21 days allowed.).   Wt Readings from Last 3 Encounters:  06/28/17 196 lb (88.9 kg)  05/11/17 195 lb (88.5 kg)  06/23/16 190 lb (86.2 kg)     Other studies reviewed: Additional studies/records reviewed today include: summarized above  ASSESSMENT AND PLAN:  1. ICD     Intact function  2. VT     NSVT only and noted above  3. ICM     Exam and CorVue do not suggest fluid OL     On BB/ACER tx  4. CAD      No anginal complaints, sees dr. Gwenlyn Found soon      On ASA, Plavix, BB, statin/zetia      Labs monitored and managed with PMD  5. HTN      No changes today   Disposition: F/u with Q 3 month remotes, annual in-clinic EP visit, sooner if needed.  Current medicines are reviewed at length with the patient today.  The patient did not have any concerns regarding medicines.  Venetia Night, PA-C 06/28/2017 9:10 AM     [facility] [address] [place] [address] [phone] (office)  [phone] (fax)

## 2017-06-28 ENCOUNTER — Ambulatory Visit (INDEPENDENT_AMBULATORY_CARE_PROVIDER_SITE_OTHER): Payer: Medicare Other | Admitting: Physician Assistant

## 2017-06-28 VITALS — BP 140/66 | HR 65 | Ht 68.0 in | Wt 196.0 lb

## 2017-06-28 DIAGNOSIS — I2510 Atherosclerotic heart disease of native coronary artery without angina pectoris: Secondary | ICD-10-CM

## 2017-06-28 DIAGNOSIS — I472 Ventricular tachycardia, unspecified: Secondary | ICD-10-CM

## 2017-06-28 DIAGNOSIS — Z95810 Presence of automatic (implantable) cardiac defibrillator: Secondary | ICD-10-CM | POA: Diagnosis not present

## 2017-06-28 DIAGNOSIS — I440 Atrioventricular block, first degree: Secondary | ICD-10-CM | POA: Diagnosis not present

## 2017-06-28 DIAGNOSIS — I255 Ischemic cardiomyopathy: Secondary | ICD-10-CM

## 2017-06-28 DIAGNOSIS — I10 Essential (primary) hypertension: Secondary | ICD-10-CM

## 2017-06-28 NOTE — Patient Instructions (Addendum)
Medication Instructions:   Your physician recommends that you continue on your current medications as directed. Please refer to the Current Medication list given to you today.   If you need a refill on your cardiac medications before your next appointment, please call your pharmacy.  Labwork: NONE ORDERED  TODAY    Testing/Procedures: NONE ORDERED  TODAY    Follow-Up: AS SCHEDULED WITH BERRY   Your physician wants you to follow-up in: [place] will receive a reminder letter in the mail two months in advance. If you don't receive a letter, please call our office to schedule the follow-up appointment.     Remote monitoring is used to monitor your Pacemaker of ICD from home. This monitoring reduces the number of office visits required to check your device to one time per year. It allows [place] to keep an eye on the functioning of your device to ensure it is working properly. You are scheduled for a device check from home on 09-14-2017 You may send your transmission at any time that day. If you have a wireless device, the transmission will be sent automatically. After your physician reviews your transmission, you will receive a postcard with your next transmission date.      Any Other Special Instructions Will Be Listed Below (If Applicable).

## 2017-06-30 ENCOUNTER — Ambulatory Visit (HOSPITAL_COMMUNITY)
Admission: RE | Admit: 2017-06-30 | Discharge: 2017-06-30 | Disposition: A | Discharge status: Home / Self Care | Payer: Medicare Other | Source: Ambulatory Visit | Attending: Internal Medicine | Admitting: Internal Medicine

## 2017-06-30 DIAGNOSIS — I779 Disorder of arteries and arterioles, unspecified: Secondary | ICD-10-CM | POA: Diagnosis not present

## 2017-06-30 DIAGNOSIS — I6523 Occlusion and stenosis of bilateral carotid arteries: Secondary | ICD-10-CM | POA: Insufficient documentation

## 2017-06-30 DIAGNOSIS — I708 Atherosclerosis of other arteries: Secondary | ICD-10-CM | POA: Insufficient documentation

## 2017-06-30 DIAGNOSIS — I739 Peripheral vascular disease, unspecified: Secondary | ICD-10-CM

## 2017-07-09 ENCOUNTER — Other Ambulatory Visit: Payer: Self-pay | Admitting: *Deleted

## 2017-07-09 MED ORDER — EZETIMIBE 10 MG PO TABS: 10.0000 mg | ORAL_TABLET | Freq: Every day | ORAL | 6 refills | Status: DC

## 2017-07-09 NOTE — Telephone Encounter (Signed)
Patient would like the rx changed to generic as this would be much cheaper for him.

## 2017-07-12 LAB — CUP PACEART REMOTE DEVICE CHECK
Battery Remaining Longevity: 85 mo
Battery Remaining Percentage: 82 %
Battery Voltage: 3.01 V
HIGH POWER IMPEDANCE MEASURED VALUE: 64 Ohm
HighPow Impedance: 64 Ohm
Implantable Lead Implant Date: 20161031
Implantable Lead Model: 181
Implantable Lead Serial Number: 333140
Lead Channel Impedance Value: 330 Ohm
Lead Channel Sensing Intrinsic Amplitude: 9.7 mV
Lead Channel Setting Pacing Amplitude: 2.5 V
Lead Channel Setting Sensing Sensitivity: 0.5 mV
MDC IDC LEAD LOCATION: 753860
MDC IDC MSMT LEADCHNL RV PACING THRESHOLD AMPLITUDE: 1 V
MDC IDC MSMT LEADCHNL RV PACING THRESHOLD PULSEWIDTH: 0.5 ms
MDC IDC PG IMPLANT DT: 20161031
MDC IDC PG SERIAL: 7308468
MDC IDC SESS DTM: 20181225070016
MDC IDC SET LEADCHNL RV PACING PULSEWIDTH: 0.5 ms
MDC IDC STAT BRADY RV PERCENT PACED: 1.3 %

## 2017-08-09 ENCOUNTER — Other Ambulatory Visit: Payer: Self-pay | Admitting: *Deleted

## 2017-08-09 MED ORDER — EZETIMIBE 10 MG PO TABS: 10.0000 mg | ORAL_TABLET | Freq: Every day | ORAL | 11 refills | Status: DC

## 2017-09-13 ENCOUNTER — Other Ambulatory Visit: Payer: Self-pay | Admitting: Cardiovascular Disease

## 2017-09-13 DIAGNOSIS — I779 Disorder of arteries and arterioles, unspecified: Secondary | ICD-10-CM

## 2017-09-13 DIAGNOSIS — I739 Peripheral vascular disease, unspecified: Principal | ICD-10-CM

## 2017-09-14 ENCOUNTER — Encounter: Payer: Self-pay | Admitting: Cardiovascular Disease

## 2017-09-14 ENCOUNTER — Ambulatory Visit (INDEPENDENT_AMBULATORY_CARE_PROVIDER_SITE_OTHER): Payer: Medicare Other | Admitting: Cardiovascular Disease

## 2017-09-14 VITALS — BP 118/56 | HR 53 | Ht 66.0 in | Wt 196.0 lb

## 2017-09-14 DIAGNOSIS — I2510 Atherosclerotic heart disease of native coronary artery without angina pectoris: Secondary | ICD-10-CM | POA: Diagnosis not present

## 2017-09-14 DIAGNOSIS — I6523 Occlusion and stenosis of bilateral carotid arteries: Secondary | ICD-10-CM

## 2017-09-14 DIAGNOSIS — E785 Hyperlipidemia, unspecified: Secondary | ICD-10-CM

## 2017-09-14 DIAGNOSIS — I255 Ischemic cardiomyopathy: Secondary | ICD-10-CM

## 2017-09-14 DIAGNOSIS — I472 Ventricular tachycardia: Secondary | ICD-10-CM

## 2017-09-14 DIAGNOSIS — I10 Essential (primary) hypertension: Secondary | ICD-10-CM | POA: Diagnosis not present

## 2017-09-14 NOTE — Assessment & Plan Note (Signed)
History of ventricular tachycardia status post ICD insertion by Dr. Caryl Comes which she follows a regular basis.

## 2017-09-14 NOTE — Patient Instructions (Signed)
Medication Instructions: Your physician recommends that you continue on your current medications as directed. Please refer to the Current Medication list given to you today.   Labwork: Your physician recommends that you return for a FASTING lipid profile and hepatic function panel at your earliest convenience.   Testing/Procedures:  In 1 year:  Your physician has requested that you have a carotid duplex. This test is an ultrasound of the carotid arteries in your neck. It looks at blood flow through these arteries that supply the brain with blood. Allow one hour for this exam. There are no restrictions or special instructions.  Follow-Up: Your physician wants you to follow-up in: 1 year with Dr. Gwenlyn Found. You will receive a reminder letter in the mail two months in advance. If you don't receive a letter, please call our office to schedule the follow-up appointment.  If you need a refill on your cardiac medications before your next appointment, please call your pharmacy.

## 2017-09-14 NOTE — Assessment & Plan Note (Signed)
History of hyperlipidemia on statin therapy. We will recheck a lipid and liver profile 

## 2017-09-14 NOTE — Progress Notes (Signed)
09/14/2017 Victor Estes   [birth year]  [id number]  Primary Physician Garner Gavel, MD Primary Cardiologist: Lorretta Harp MD [name], [place]  HPI:  Victor Estes is a 72 y.o.  mildly overweight, married Caucasian male, father of 2, grandfather to 5 grandchildren who I have been taking care of for the last 23years. I last saw him in the office 09/04/15.Marland KitchenHe had an MI back in 1993 and subsequent coronary artery bypass grafting x4 in 1995. His risk factors are remarkable for remote tobacco abuse, hypertension, hyperlipidemia, and family history. His last Myoview performed 2 year ago showed inferior scar without ischemia, unchanged from prior studies. A 2D echo revealed an EF of 45% to 50% with inferior and septal hypokinesia. Carotid Dopplers showed moderate right ICA stenosis unchanged from prior studies. He is neurologically asymptomatic. [facility] [place] 03/31/13 revealed a total cholesterol of 141, LDL 61 HDL 63. He denies chest pain or shortness of breath. He had elective TKR replacement by Dr. Percell Miller 07/05/13. I obtained a Myoview stress test 03/29/13 that showed impaired scar without ischemia. The epicardium slightly to 40% by quantitative gated SPECT. A 2-D echocardiogram performed 05/24/13 revealed an ejection fraction of 40-45%.. Since I saw him a year ago he's remained clinically stable. He gets a rare episode of dizziness on the golf course. 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 He was admitted to [facility] on 04/27/15 with sustained ventricular tachycardia requiring cardioversion. He had a cardiac catheterization by Dr. Burt Knack revealing stable anatomy without a "culprit lesion and therefore his VT was thought to be "scar related". Based on this, Dr. Caryl Comes placed a ICD for secondary prevention as follow this up as an outpatient since. He continues to do well and was asymptomatic. Since I saw him  back 2 years ago he's remained stable. He denies chest pain or shortness of breath.   Current Meds  Medication Sig  . acetaminophen (TYLENOL) 325 MG tablet Take 650 mg by mouth every 6 (six) hours as needed for mild pain, moderate pain, fever or headache.  [name] amLODipine (NORVASC) 5 MG tablet TAKE 1 TABLET(5 MG) BY MOUTH DAILY  . aspirin EC 81 MG EC tablet Take 1 tablet (81 mg total) by mouth daily with breakfast.  . atorvastatin (LIPITOR) 40 MG tablet Take 1 tablet (40 mg total) by mouth at bedtime. Or at supper  . carvedilol (COREG) 6.25 MG tablet Take 1 tablet (6.25 mg total) by mouth 2 (two) times daily with a meal.  . Cholecalciferol 2000 UNITS TABS Take 2,000 Units by mouth daily.  . clopidogrel (PLAVIX) 75 MG tablet TAKE 1 TABLET(75 MG) BY MOUTH DAILY  . ezetimibe (ZETIA) 10 MG tablet Take 1 tablet (10 mg total) by mouth daily.  [name] lisinopril (PRINIVIL,ZESTRIL) 20 MG tablet Take 1 tablet (20 mg total) by mouth every morning.  . methocarbamol (ROBAXIN) 500 MG tablet Take 1 tablet (500 mg total) by mouth every 6 (six) hours as needed for muscle spasms.  . pantoprazole (PROTONIX) 40 MG tablet Take 40 mg by mouth 2 (two) times daily.   . tamsulosin (FLOMAX) 0.4 MG CAPS capsule Take 0.4 mg by mouth daily after breakfast.  . traMADol (ULTRAM) 50 MG tablet Take 50 mg by mouth every 6 (six) hours as needed for moderate pain or severe pain.     Allergies  Allergen Reactions  . Clarithromycin Swelling    Social  History   Socioeconomic History  . Marital status: Married    Spouse name: Not on file  . Number of children: Not on file  . Years of education: Not on file  . Highest education level: Not on file  Social Needs  . Financial resource strain: Not on file  . Food insecurity - worry: Not on file  . Food insecurity - inability: Not on file  . Transportation needs - medical: Not on file  . Transportation needs - non-medical: Not on file  Occupational History  . Occupation: Owns a  Fish farm manager  Tobacco Use  . Smoking status: Former Smoker    Last attempt to quit: 04/06/1983    Years since quitting: 34.4  . Smokeless tobacco: Never Used  Substance and Sexual Activity  . Alcohol use: No    Alcohol/week: 0.0 oz  . Drug use: No  . Sexual activity: Not on file  Other Topics Concern  . Not on file  Social History Narrative  . Not on file     Review of Systems: General: negative for chills, fever, night sweats or weight changes.  Cardiovascular: negative for chest pain, dyspnea on exertion, edema, orthopnea, palpitations, paroxysmal nocturnal dyspnea or shortness of breath Dermatological: negative for rash Respiratory: negative for cough or wheezing Urologic: negative for hematuria Abdominal: negative for nausea, vomiting, diarrhea, bright red blood per rectum, melena, or hematemesis Neurologic: negative for visual changes, syncope, or dizziness All other systems reviewed and are otherwise negative except as noted above.    Blood pressure (!) 118/56, pulse (!) 53, height 5\' 6"  (1.676 m), weight 196 lb (88.9 kg).  General appearance: alert and no distress Neck: no adenopathy, no carotid bruit, no JVD, supple, symmetrical, trachea midline and thyroid not enlarged, symmetric, no tenderness/mass/nodules Lungs: clear to auscultation bilaterally Heart: regular rate and rhythm, S1, S2 normal, no murmur, click, rub or gallop Extremities: extremities normal, atraumatic, no cyanosis or edema Pulses: 2+ and symmetric Skin: Skin color, texture, turgor normal. No rashes or lesions Neurologic: Alert and oriented X 3, normal strength and tone. Normal symmetric reflexes. Normal coordination and gait  EKG sinus bradycardia 53 with nonspecific ST and T-wave changes and voltage criteria for LVH. I personally reviewed this EKG.  ASSESSMENT AND PLAN:   Coronary artery disease History of CAD status post myocardial infarction in 1993 and subsequent bypass grafting 4 in 1995.  His last cardiac catheterization performed by Dr. Burt Knack 04/27/15 showed stable anatomy with adequate culprit lesion. This was done in the setting of ventricular tachycardia thought to be "scar related". He denies chest pain or shortness of breath.  Essential hypertension History of essential hypertension blood pressure measured 118/56. He is on amlodipine, carvedilol and lisinopril. Continue current meds at current dosing.  Hyperlipidemia LDL goal <70 History of hyperlipidemia on statin therapy. We will recheck a lipid and liver profile  Carotid artery disease istory of carotid artery disease with Dopplers performed in our office 06/30/17 revealing moderate right greater than left ICA stenosis. This will be repeated in one year.  Ventricular tachycardia, sustained ([place]) History of ventricular tachycardia status post ICD insertion by Dr. Caryl Comes which she follows a regular basis.  Ischemic cardiomyopathy History of ischemic cardiomyopathy with an EF in the 40-45% range by 2-D echocardiogram cardiography last performed 04/30/15. He is on appropriate pharmacology for this and is asymptomatic.      Lorretta Harp MD FACP,FACC,FAHA, [facility] 09/14/2017 10:15 AM

## 2017-09-14 NOTE — Assessment & Plan Note (Signed)
History of ischemic cardiomyopathy with an EF in the 40-45% range by 2-D echocardiogram cardiography last performed 04/30/15. He is on appropriate pharmacology for this and is asymptomatic.

## 2017-09-14 NOTE — Assessment & Plan Note (Signed)
History of CAD status post myocardial infarction in 1993 and subsequent bypass grafting 4 in 1995. His last cardiac catheterization performed by Dr. Burt Knack 04/27/15 showed stable anatomy with adequate culprit lesion. This was done in the setting of ventricular tachycardia thought to be "scar related". He denies chest pain or shortness of breath.

## 2017-09-14 NOTE — Assessment & Plan Note (Signed)
History of essential hypertension blood pressure measured 118/56. He is on amlodipine, carvedilol and lisinopril. Continue current meds at current dosing.

## 2017-09-14 NOTE — Assessment & Plan Note (Signed)
istory of carotid artery disease with Dopplers performed in our office 06/30/17 revealing moderate right greater than left ICA stenosis. This will be repeated in one year.

## 2017-09-23 ENCOUNTER — Ambulatory Visit (INDEPENDENT_AMBULATORY_CARE_PROVIDER_SITE_OTHER): Payer: Medicare Other | Admitting: *Deleted

## 2017-09-23 DIAGNOSIS — I472 Ventricular tachycardia, unspecified: Secondary | ICD-10-CM

## 2017-09-24 NOTE — Progress Notes (Signed)
Remote ICD transmission.   

## 2017-09-28 ENCOUNTER — Encounter: Payer: Self-pay | Admitting: Cardiology

## 2017-09-28 NOTE — Progress Notes (Signed)
Letter  

## 2017-10-05 DIAGNOSIS — E785 Hyperlipidemia, unspecified: Secondary | ICD-10-CM | POA: Diagnosis not present

## 2017-10-05 DIAGNOSIS — I2510 Atherosclerotic heart disease of native coronary artery without angina pectoris: Secondary | ICD-10-CM | POA: Diagnosis not present

## 2017-10-05 LAB — HEPATIC FUNCTION PANEL
ALT: 19 IU/L (ref 0–44)
AST: 18 IU/L (ref 0–40)
Albumin: 3.9 g/dL (ref 3.5–4.8)
Alkaline Phosphatase: 114 IU/L (ref 39–117)
Bilirubin Total: 0.4 mg/dL (ref 0.0–1.2)
Bilirubin, Direct: 0.14 mg/dL (ref 0.00–0.40)
Total Protein: 6.4 g/dL (ref 6.0–8.5)

## 2017-10-05 LAB — LIPID PANEL
CHOL/HDL RATIO: 2.8 ratio (ref 0.0–5.0)
Cholesterol, Total: 110 mg/dL (ref 100–199)
HDL: 39 mg/dL — AB (ref >39)
LDL CALC: 53 mg/dL (ref 0–99)
TRIGLYCERIDES: 89 mg/dL (ref 0–149)
VLDL Cholesterol Cal: 18 mg/dL (ref 5–40)

## 2017-10-10 LAB — CUP PACEART REMOTE DEVICE CHECK
Battery Voltage: 2.99 V
Brady Statistic RV Percent Paced: 1.8 %
HighPow Impedance: 66 Ohm
HighPow Impedance: 66 Ohm
Implantable Lead Location: 753860
Implantable Pulse Generator Implant Date: 20161031
Lead Channel Pacing Threshold Amplitude: 1 V
Lead Channel Pacing Threshold Pulse Width: 0.5 ms
Lead Channel Setting Pacing Amplitude: 2.5 V
Lead Channel Setting Pacing Pulse Width: 0.5 ms
Lead Channel Setting Sensing Sensitivity: 0.5 mV
MDC IDC LEAD IMPLANT DT: 20161031
MDC IDC LEAD SERIAL: 333140
MDC IDC MSMT BATTERY REMAINING LONGEVITY: 84 mo
MDC IDC MSMT BATTERY REMAINING PERCENTAGE: 81 %
MDC IDC MSMT LEADCHNL RV IMPEDANCE VALUE: 350 Ohm
MDC IDC MSMT LEADCHNL RV SENSING INTR AMPL: 11.7 mV
MDC IDC SESS DTM: 20190328060019
Pulse Gen Serial Number: 7308468

## 2017-10-13 ENCOUNTER — Encounter: Payer: Self-pay | Admitting: Cardiovascular Disease

## 2017-12-23 ENCOUNTER — Ambulatory Visit (INDEPENDENT_AMBULATORY_CARE_PROVIDER_SITE_OTHER): Payer: Medicare Other | Admitting: *Deleted

## 2017-12-23 ENCOUNTER — Encounter: Payer: Self-pay | Admitting: Cardiology

## 2017-12-23 DIAGNOSIS — I472 Ventricular tachycardia, unspecified: Secondary | ICD-10-CM

## 2017-12-23 NOTE — Progress Notes (Signed)
Remote ICD transmission.   

## 2018-01-04 LAB — CUP PACEART REMOTE DEVICE CHECK
Battery Remaining Percentage: 79 %
Brady Statistic RV Percent Paced: 1.9 %
HIGH POWER IMPEDANCE MEASURED VALUE: 64 Ohm
HighPow Impedance: 64 Ohm
Implantable Lead Implant Date: 20161031
Implantable Lead Location: 753860
Implantable Lead Serial Number: 333140
Lead Channel Impedance Value: 340 Ohm
Lead Channel Pacing Threshold Amplitude: 1 V
Lead Channel Pacing Threshold Pulse Width: 0.5 ms
Lead Channel Setting Sensing Sensitivity: 0.5 mV
MDC IDC MSMT BATTERY REMAINING LONGEVITY: 82 mo
MDC IDC MSMT BATTERY VOLTAGE: 2.99 V
MDC IDC MSMT LEADCHNL RV SENSING INTR AMPL: 11 mV
MDC IDC PG IMPLANT DT: 20161031
MDC IDC PG SERIAL: 7308468
MDC IDC SESS DTM: 20190627060015
MDC IDC SET LEADCHNL RV PACING AMPLITUDE: 2.5 V
MDC IDC SET LEADCHNL RV PACING PULSEWIDTH: 0.5 ms

## 2018-03-01 DIAGNOSIS — M25552 Pain in left hip: Secondary | ICD-10-CM | POA: Diagnosis not present

## 2018-03-01 DIAGNOSIS — M545 Low back pain: Secondary | ICD-10-CM | POA: Diagnosis not present

## 2018-03-24 ENCOUNTER — Encounter: Payer: Medicare Other | Admitting: *Deleted

## 2018-03-25 ENCOUNTER — Encounter: Payer: Self-pay | Admitting: Cardiology

## 2018-03-28 ENCOUNTER — Ambulatory Visit (INDEPENDENT_AMBULATORY_CARE_PROVIDER_SITE_OTHER): Payer: Medicare Other | Admitting: *Deleted

## 2018-03-28 DIAGNOSIS — I472 Ventricular tachycardia: Secondary | ICD-10-CM

## 2018-03-31 NOTE — Progress Notes (Signed)
Remote ICD transmission.   

## 2018-04-04 DIAGNOSIS — M545 Low back pain: Secondary | ICD-10-CM | POA: Diagnosis not present

## 2018-04-11 DIAGNOSIS — M545 Low back pain: Secondary | ICD-10-CM | POA: Diagnosis not present

## 2018-04-11 DIAGNOSIS — M47896 Other spondylosis, lumbar region: Secondary | ICD-10-CM | POA: Diagnosis not present

## 2018-04-14 DIAGNOSIS — M545 Low back pain: Secondary | ICD-10-CM | POA: Diagnosis not present

## 2018-04-14 DIAGNOSIS — M47896 Other spondylosis, lumbar region: Secondary | ICD-10-CM | POA: Diagnosis not present

## 2018-04-19 DIAGNOSIS — M47896 Other spondylosis, lumbar region: Secondary | ICD-10-CM | POA: Diagnosis not present

## 2018-04-19 DIAGNOSIS — M545 Low back pain: Secondary | ICD-10-CM | POA: Diagnosis not present

## 2018-04-21 LAB — CUP PACEART REMOTE DEVICE CHECK
Battery Voltage: 2.98 V
Brady Statistic RV Percent Paced: 2.3 %
Date Time Interrogation Session: 20190930034619
HighPow Impedance: 71 Ohm
HighPow Impedance: 71 Ohm
Implantable Lead Implant Date: 20161031
Implantable Lead Location: 753860
Implantable Lead Serial Number: 333140
Lead Channel Impedance Value: 350 Ohm
Lead Channel Pacing Threshold Amplitude: 1 V
Lead Channel Pacing Threshold Pulse Width: 0.5 ms
Lead Channel Sensing Intrinsic Amplitude: 11.6 mV
Lead Channel Setting Pacing Amplitude: 2.5 V
Lead Channel Setting Sensing Sensitivity: 0.5 mV
MDC IDC MSMT BATTERY REMAINING LONGEVITY: 80 mo
MDC IDC MSMT BATTERY REMAINING PERCENTAGE: 77 %
MDC IDC PG IMPLANT DT: 20161031
MDC IDC SET LEADCHNL RV PACING PULSEWIDTH: 0.5 ms
Pulse Gen Serial Number: 7308468

## 2018-04-26 DIAGNOSIS — M47896 Other spondylosis, lumbar region: Secondary | ICD-10-CM | POA: Diagnosis not present

## 2018-04-26 DIAGNOSIS — M545 Low back pain: Secondary | ICD-10-CM | POA: Diagnosis not present

## 2018-05-03 DIAGNOSIS — M545 Low back pain: Secondary | ICD-10-CM | POA: Diagnosis not present

## 2018-05-03 DIAGNOSIS — M47896 Other spondylosis, lumbar region: Secondary | ICD-10-CM | POA: Diagnosis not present

## 2018-05-30 DIAGNOSIS — M5416 Radiculopathy, lumbar region: Secondary | ICD-10-CM | POA: Diagnosis not present

## 2018-06-27 ENCOUNTER — Ambulatory Visit (INDEPENDENT_AMBULATORY_CARE_PROVIDER_SITE_OTHER): Payer: Medicare Other

## 2018-06-27 DIAGNOSIS — I472 Ventricular tachycardia, unspecified: Secondary | ICD-10-CM

## 2018-06-27 NOTE — Progress Notes (Signed)
Remote ICD transmission.   

## 2018-06-28 LAB — CUP PACEART REMOTE DEVICE CHECK
Battery Voltage: 2.98 V
Brady Statistic RV Percent Paced: 2.4 %
Date Time Interrogation Session: 20191230070016
HighPow Impedance: 65 Ohm
HighPow Impedance: 65 Ohm
Implantable Lead Location: 753860
Implantable Lead Model: 181
Implantable Lead Serial Number: 333140
Implantable Pulse Generator Implant Date: 20161031
Lead Channel Impedance Value: 340 Ohm
Lead Channel Pacing Threshold Amplitude: 1 V
Lead Channel Pacing Threshold Pulse Width: 0.5 ms
Lead Channel Sensing Intrinsic Amplitude: 10.2 mV
Lead Channel Setting Pacing Amplitude: 2.5 V
Lead Channel Setting Pacing Pulse Width: 0.5 ms
Lead Channel Setting Sensing Sensitivity: 0.5 mV
MDC IDC LEAD IMPLANT DT: 20161031
MDC IDC MSMT BATTERY REMAINING LONGEVITY: 78 mo
MDC IDC MSMT BATTERY REMAINING PERCENTAGE: 75 %
Pulse Gen Serial Number: 7308468

## 2018-07-04 DIAGNOSIS — M5416 Radiculopathy, lumbar region: Secondary | ICD-10-CM | POA: Diagnosis not present

## 2018-08-19 ENCOUNTER — Ambulatory Visit (HOSPITAL_COMMUNITY)
Admission: RE | Admit: 2018-08-19 | Discharge: 2018-08-19 | Disposition: A | Discharge status: Home / Self Care | Payer: Medicare Other | Source: Ambulatory Visit | Attending: Cardiovascular Disease | Admitting: Cardiovascular Disease

## 2018-08-19 DIAGNOSIS — I739 Peripheral vascular disease, unspecified: Secondary | ICD-10-CM | POA: Diagnosis not present

## 2018-08-19 DIAGNOSIS — I779 Disorder of arteries and arterioles, unspecified: Secondary | ICD-10-CM

## 2018-08-22 ENCOUNTER — Other Ambulatory Visit: Payer: Self-pay

## 2018-08-22 ENCOUNTER — Telehealth: Payer: Self-pay

## 2018-08-22 DIAGNOSIS — I6523 Occlusion and stenosis of bilateral carotid arteries: Secondary | ICD-10-CM

## 2018-08-22 NOTE — Telephone Encounter (Signed)
Pt aware of results of recent carotid dopplers and to repeat in 12 mos

## 2018-08-22 NOTE — Telephone Encounter (Signed)
LMTCB 2/24 for carotid doppler results

## 2018-08-30 ENCOUNTER — Encounter: Payer: Self-pay | Admitting: Cardiovascular Disease

## 2018-08-30 ENCOUNTER — Ambulatory Visit (INDEPENDENT_AMBULATORY_CARE_PROVIDER_SITE_OTHER): Payer: Medicare Other | Admitting: Cardiovascular Disease

## 2018-08-30 DIAGNOSIS — I6523 Occlusion and stenosis of bilateral carotid arteries: Secondary | ICD-10-CM | POA: Diagnosis not present

## 2018-08-30 DIAGNOSIS — E785 Hyperlipidemia, unspecified: Secondary | ICD-10-CM | POA: Diagnosis not present

## 2018-08-30 DIAGNOSIS — I2510 Atherosclerotic heart disease of native coronary artery without angina pectoris: Secondary | ICD-10-CM

## 2018-08-30 DIAGNOSIS — I255 Ischemic cardiomyopathy: Secondary | ICD-10-CM

## 2018-08-30 DIAGNOSIS — I10 Essential (primary) hypertension: Secondary | ICD-10-CM | POA: Diagnosis not present

## 2018-08-30 NOTE — Assessment & Plan Note (Signed)
History of essential hypertension her blood pressure measured today at 155/80.  He is on amlodipine, carvedilol and lisinopril.

## 2018-08-30 NOTE — Patient Instructions (Signed)
Medication Instructions:  Your physician recommends that you continue on your current medications as directed. Please refer to the Current Medication list given to you today.  If you need a refill on your cardiac medications before your next appointment, please call your pharmacy.   Lab work: NONE If you have labs (blood work) drawn today and your tests are completely normal, you will receive your results only by: [name] MyChart Message (if you have MyChart) OR . A paper copy in the mail If you have any lab test that is abnormal or we need to change your treatment, we will call you to review the results.  Testing/Procedures: Your physician has requested that you have an echocardiogram. Echocardiography is a painless test that uses sound waves to create images of your heart. It provides your doctor with information about the size and shape of your heart and how well your heart's chambers and valves are working. This procedure takes approximately one hour. There are no restrictions for this procedure. SCHEDULE IN 2 MONTHS  Your physician has requested that you have a carotid duplex. This test is an ultrasound of the carotid arteries in your neck. It looks at blood flow through these arteries that supply the brain with blood. Allow one hour for this exam. There are no restrictions or special instructions. SCHEDULE IN 1 YEAR   Follow-Up: At [facility], you and your health needs are our priority.  As part of our continuing mission to provide you with exceptional heart care, we have created designated Provider Care Teams.  These Care Teams include your primary Cardiologist (physician) and Advanced Practice Providers (APPs -  Physician Assistants and Nurse Practitioners) who all work together to provide you with the care you need, when you need it. . You will need a follow up appointment in 3 months.  You may see Dr. Gwenlyn Found or one of the following Advanced Practice Providers on your designated Care  Team:   . Kerin Ransom, [place] . Almyra Deforest, PA-C . Fabian Sharp, PA-C . Jory Sims, DNP . Rosaria Ferries, PA-C . Roby Lofts, PA-C . Sande Rives, PA-C  Any Other Special Instructions Will Be Listed Below (If Applicable). YOU WILL NEED A FOLLOW UP APPOINTMENT WITH DR. Virl Axe FOR ICD CHECK.

## 2018-08-30 NOTE — Progress Notes (Signed)
08/30/2018 Victor Estes   10/05/1945  [id number]  Primary Physician Garner Gavel, MD Primary Cardiologist: Lorretta Harp MD [name], [place]  HPI:  Victor Estes is a 73 y.o.  mildly overweight, married Caucasian male, father of 2, grandfather to 5 grandchildren who I have been taking care of for the last 23years. I last saw him in the office  09/14/2017.Marland KitchenHe had an MI back in 1993 and subsequent coronary artery bypass grafting x4 in 1995. His risk factors are remarkable for remote tobacco abuse, hypertension, hyperlipidemia, and family history. His last Myoview performed 2 year ago showed inferior scar without ischemia, unchanged from prior studies. A 2D echo revealed an EF of 45% to 50% with inferior and septal hypokinesia. Carotid Dopplers showed moderate right ICA stenosis unchanged from prior studies. He is neurologically asymptomatic. [facility] [place] 03/31/13 revealed a total cholesterol of 141, LDL 61 HDL 63. He denies chest pain or shortness of breath. He had elective TKR replacement by Dr. Percell Miller 07/05/13. I obtained a Myoview stress test 03/29/13 that showed impaired scar without ischemia. The epicardium slightly to 40% by quantitative gated SPECT. A 2-D echocardiogram performed 05/24/13 revealed an ejection fraction of 40-45%.. Since I saw him a year ago he's remained clinically stable. He gets a rare episode of dizziness on the golf course. 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 He was admitted to [facility] on 04/27/15 with sustained ventricular tachycardia requiring cardioversion. He had a cardiac catheterization by Dr. Burt Knack revealing stable anatomy without a "culprit lesion and therefore his VT was thought to be "scar related". Based on this, Dr. Caryl Comes placed a ICD for secondary prevention as follow this up as an outpatient since. He continues to do well and was asymptomatic. Since I saw him  back 1 year ago, his only complaints are some increasing dyspnea on exertion but denies chest pain.  His last 2D echo performed 04/30/2015 revealed an EF of 40 to 45% with mild AI and MR..   Current Meds  Medication Sig  . acetaminophen (TYLENOL) 325 MG tablet Take 650 mg by mouth every 6 (six) hours as needed for mild pain, moderate pain, fever or headache.  [name] amLODipine (NORVASC) 5 MG tablet TAKE 1 TABLET(5 MG) BY MOUTH DAILY  . aspirin EC 81 MG EC tablet Take 1 tablet (81 mg total) by mouth daily with breakfast.  . atorvastatin (LIPITOR) 40 MG tablet Take 1 tablet (40 mg total) by mouth at bedtime. Or at supper  . carvedilol (COREG) 6.25 MG tablet Take 1 tablet (6.25 mg total) by mouth 2 (two) times daily with a meal.  . Cholecalciferol 2000 UNITS TABS Take 2,000 Units by mouth daily.  . clopidogrel (PLAVIX) 75 MG tablet TAKE 1 TABLET(75 MG) BY MOUTH DAILY  . ezetimibe (ZETIA) 10 MG tablet Take 1 tablet (10 mg total) by mouth daily.  [name] lisinopril (PRINIVIL,ZESTRIL) 20 MG tablet Take 1 tablet (20 mg total) by mouth every morning.  . methocarbamol (ROBAXIN) 500 MG tablet Take 1 tablet (500 mg total) by mouth every 6 (six) hours as needed for muscle spasms.  . pantoprazole (PROTONIX) 40 MG tablet Take 40 mg by mouth 2 (two) times daily.   . tamsulosin (FLOMAX) 0.4 MG CAPS capsule Take 0.4 mg by mouth daily after breakfast.  . traMADol (ULTRAM) 50 MG tablet Take 50 mg by mouth every 6 (six) hours as needed  for moderate pain or severe pain.     Allergies  Allergen Reactions  . Clarithromycin Swelling    Social History   Socioeconomic History  . Marital status: Married    Spouse name: Not on file  . Number of children: Not on file  . Years of education: Not on file  . Highest education level: Not on file  Occupational History  . Occupation: Owns a Public librarian  . Financial resource strain: Not on file  . Food insecurity:    Worry: Not on file    Inability: Not on  file  . Transportation needs:    Medical: Not on file    Non-medical: Not on file  Tobacco Use  . Smoking status: Former Smoker    Last attempt to quit: 04/06/1983    Years since quitting: 35.4  . Smokeless tobacco: Never Used  Substance and Sexual Activity  . Alcohol use: No    Alcohol/week: 0.0 standard drinks  . Drug use: No  . Sexual activity: Not on file  Lifestyle  . Physical activity:    Days per week: Not on file    Minutes per session: Not on file  . Stress: Not on file  Relationships  . Social connections:    Talks on phone: Not on file    Gets together: Not on file    Attends religious service: Not on file    Active member of club or organization: Not on file    Attends meetings of clubs or organizations: Not on file    Relationship status: Not on file  . Intimate partner violence:    Fear of current or ex partner: Not on file    Emotionally abused: Not on file    Physically abused: Not on file    Forced sexual activity: Not on file  Other Topics Concern  . Not on file  Social History Narrative  . Not on file     Review of Systems: General: negative for chills, fever, night sweats or weight changes.  Cardiovascular: negative for chest pain, dyspnea on exertion, edema, orthopnea, palpitations, paroxysmal nocturnal dyspnea or shortness of breath Dermatological: negative for rash Respiratory: negative for cough or wheezing Urologic: negative for hematuria Abdominal: negative for nausea, vomiting, diarrhea, bright red blood per rectum, melena, or hematemesis Neurologic: negative for visual changes, syncope, or dizziness All other systems reviewed and are otherwise negative except as noted above.    Blood pressure (!) 155/80, pulse (!) 55, height 5\' 8"  (1.727 m), weight 201 lb 3.2 oz (91.3 kg).  General appearance: alert and no distress Neck: no adenopathy, no JVD, supple, symmetrical, trachea midline, thyroid not enlarged, symmetric, no  tenderness/mass/nodules and Soft bilateral carotid bruits Lungs: clear to auscultation bilaterally Heart: regular rate and rhythm, S1, S2 normal, no murmur, click, rub or gallop Extremities: extremities normal, atraumatic, no cyanosis or edema Pulses: 2+ and symmetric Skin: Skin color, texture, turgor normal. No rashes or lesions Neurologic: Alert and oriented X 3, normal strength and tone. Normal symmetric reflexes. Normal coordination and gait  EKG sinus bradycardia at 45 with inferior Q waves and nonspecific ST and T wave changes.  I personally reviewed this EKG.  ASSESSMENT AND PLAN:   Coronary artery disease History of CAD status post myocardial infarction back in 1993 and subsequent bypass grafting x4 in 1995.  He had a heart catheterization performed by Dr. Burt Knack 04/27/2015 revealing an ejection fraction of 35%, occluded vein graft to the distal right  with high-grade multiple lesions in the RCA sequential LIMA to the diagonal and LAD with high-grade main, proximal LAD and circumflex disease.  His last 2D echo performed 11/1/to 16 revealed an EF of 40 to 45%.  He complains of some increasing dyspnea on exertion but denies chest pain.  I am going to get a 2D echo to further evaluate.  Essential hypertension History of essential hypertension her blood pressure measured today at 155/80.  He is on amlodipine, carvedilol and lisinopril.  Hyperlipidemia LDL goal <70 History of hyperlipidemia on Zetia and atorvastatin with lipid profile performed 10/05/2017 revealing a total cholesterol 110, LDL 53 and HDL of 39.  Carotid artery disease History of carotid artery disease with recent carotid Dopplers revealing moderate right and mild to moderate left ICA stenosis.  This will be repeated in 1 year.  Ischemic cardiomyopathy History of ischemic cardiomyopathy with an EF in the 40% range on appropriate medications status post ICD implantation by Dr. Caryl Comes.  Dr. Caryl Comes has not seen Mr. Ealy   since 2017.  We will arrange a follow-up office visit.      Lorretta Harp MD FACP,FACC,FAHA, [facility] 08/30/2018 12:05 PM

## 2018-08-30 NOTE — Assessment & Plan Note (Signed)
History of CAD status post myocardial infarction back in 1993 and subsequent bypass grafting x4 in 1995.  He had a heart catheterization performed by Dr. Burt Knack 04/27/2015 revealing an ejection fraction of 35%, occluded vein graft to the distal right with high-grade multiple lesions in the RCA sequential LIMA to the diagonal and LAD with high-grade main, proximal LAD and circumflex disease.  His last 2D echo performed 11/1/to 16 revealed an EF of 40 to 45%.  He complains of some increasing dyspnea on exertion but denies chest pain.  I am going to get a 2D echo to further evaluate.

## 2018-08-30 NOTE — Assessment & Plan Note (Signed)
History of hyperlipidemia on Zetia and atorvastatin with lipid profile performed 10/05/2017 revealing a total cholesterol 110, LDL 53 and HDL of 39.

## 2018-08-30 NOTE — Assessment & Plan Note (Signed)
History of carotid artery disease with recent carotid Dopplers revealing moderate right and mild to moderate left ICA stenosis.  This will be repeated in 1 year.

## 2018-08-30 NOTE — Assessment & Plan Note (Signed)
History of ischemic cardiomyopathy with an EF in the 40% range on appropriate medications status post ICD implantation by Dr. Caryl Comes.  Dr. Caryl Comes has not seen Mr. Mangels  since 2017.  We will arrange a follow-up office visit.

## 2018-09-08 ENCOUNTER — Encounter: Payer: Self-pay | Admitting: Internal Medicine

## 2018-09-13 ENCOUNTER — Telehealth: Payer: Self-pay

## 2018-09-13 NOTE — Telephone Encounter (Signed)
Attempted to call pt regarding upcoming 3/23 appointment. Mailbox was full. Will attempt at a later time today.

## 2018-09-14 NOTE — Telephone Encounter (Signed)
Spoke with pt to assess his needs. He is scheduled to see Dr Caryl Comes for a routine follow up on his device. He states he is feeling well except for his dyspnea upon exertion which was discussed during his recent OV with Dr Gwenlyn Found. Pt denies any needs or complaints that need to be addressed by EP at this time. He has agreed to postpone his upcoming appt. I encouraged him to call Dr Kennon Holter office to address his ongoing SOB. He has agreed with plan and has no additional questions.

## 2018-09-19 ENCOUNTER — Encounter: Payer: Medicare Other | Admitting: Internal Medicine

## 2018-09-26 ENCOUNTER — Other Ambulatory Visit: Payer: Self-pay

## 2018-09-26 ENCOUNTER — Ambulatory Visit (INDEPENDENT_AMBULATORY_CARE_PROVIDER_SITE_OTHER): Payer: Medicare Other | Admitting: *Deleted

## 2018-09-26 DIAGNOSIS — I472 Ventricular tachycardia, unspecified: Secondary | ICD-10-CM

## 2018-09-26 DIAGNOSIS — I255 Ischemic cardiomyopathy: Secondary | ICD-10-CM | POA: Diagnosis not present

## 2018-09-27 ENCOUNTER — Other Ambulatory Visit: Payer: Self-pay | Admitting: Cardiovascular Disease

## 2018-09-27 LAB — CUP PACEART REMOTE DEVICE CHECK
Battery Remaining Longevity: 76 mo
Battery Remaining Percentage: 74 %
Battery Voltage: 2.98 V
Brady Statistic RV Percent Paced: 2.4 %
Date Time Interrogation Session: 20200331045650
HighPow Impedance: 57 Ohm
HighPow Impedance: 57 Ohm
Implantable Lead Implant Date: 20161031
Implantable Lead Location: 753860
Implantable Lead Model: 181
Implantable Lead Serial Number: 333140
Lead Channel Impedance Value: 310 Ohm
Lead Channel Pacing Threshold Amplitude: 1 V
Lead Channel Pacing Threshold Pulse Width: 0.5 ms
Lead Channel Sensing Intrinsic Amplitude: 6.5 mV
Lead Channel Setting Pacing Amplitude: 2.5 V
Lead Channel Setting Pacing Pulse Width: 0.5 ms
Lead Channel Setting Sensing Sensitivity: 0.5 mV
MDC IDC PG IMPLANT DT: 20161031
Pulse Gen Serial Number: 7308468

## 2018-10-05 NOTE — Progress Notes (Signed)
Remote ICD transmission.   

## 2018-10-31 ENCOUNTER — Telehealth: Payer: Self-pay | Admitting: Cardiovascular Disease

## 2018-10-31 ENCOUNTER — Ambulatory Visit (HOSPITAL_COMMUNITY): Payer: Medicare Other | Attending: Cardiovascular Disease

## 2018-10-31 NOTE — Telephone Encounter (Signed)
Called and spoke with the echo techs here at [facility] location. Asked if they would be able to reschedule this pt for today, for he missed his appt to have this done at 0830.  Per [facility] [employer], unfortunately they are unable to work him into the echo schedule for today, for they are booked up.  Will send this message back to Dr. Kennon Holter nurse at [place] to make her aware of this and to endorse to the pt.  To reschedule the pt to another day, [facility] office will need to send Neil Crouch, echo scheduler a message to reschedule this pt.

## 2018-10-31 NOTE — Telephone Encounter (Signed)
Attempted call to echo dept, but unable to reach personnel. Will route to [facility] triage to see if pt still able to present today

## 2018-10-31 NOTE — Telephone Encounter (Signed)
New Message   Patient missed Echo appointment this morning and needs to know if he could come in today still or reschedule to different date.  Tried to call echo department no answer.

## 2018-11-25 ENCOUNTER — Telehealth: Payer: Self-pay | Admitting: *Deleted

## 2018-11-25 NOTE — Telephone Encounter (Signed)
LVMTCB

## 2018-11-25 NOTE — Telephone Encounter (Signed)

## 2018-11-28 ENCOUNTER — Telehealth: Payer: Self-pay | Admitting: Cardiovascular Disease

## 2018-11-28 NOTE — Telephone Encounter (Signed)
smartphone/ consent/ my chart pending/ pre reg completed

## 2018-11-28 NOTE — Telephone Encounter (Signed)
Call transferred to triage...  Pt called back for Victor Estes at the front desk to let her know nothing has changed on his demographics or Insurance for his 11/30/18 upcoming appt with Dr. Gwenlyn Found.

## 2018-11-28 NOTE — Telephone Encounter (Signed)
New Message ° ° ° °Pt is returning call  ° ° ° °Please call back  °

## 2018-11-30 ENCOUNTER — Telehealth: Payer: Self-pay

## 2018-11-30 ENCOUNTER — Telehealth (INDEPENDENT_AMBULATORY_CARE_PROVIDER_SITE_OTHER): Payer: Medicare Other | Admitting: Cardiovascular Disease

## 2018-11-30 ENCOUNTER — Encounter: Payer: Self-pay | Admitting: Cardiovascular Disease

## 2018-11-30 DIAGNOSIS — I255 Ischemic cardiomyopathy: Secondary | ICD-10-CM

## 2018-11-30 DIAGNOSIS — I10 Essential (primary) hypertension: Secondary | ICD-10-CM | POA: Diagnosis not present

## 2018-11-30 DIAGNOSIS — I6523 Occlusion and stenosis of bilateral carotid arteries: Secondary | ICD-10-CM

## 2018-11-30 DIAGNOSIS — E785 Hyperlipidemia, unspecified: Secondary | ICD-10-CM | POA: Diagnosis not present

## 2018-11-30 DIAGNOSIS — I472 Ventricular tachycardia, unspecified: Secondary | ICD-10-CM

## 2018-11-30 NOTE — Progress Notes (Signed)
Virtual Visit via Telephone Note   This visit type was conducted due to national recommendations for restrictions regarding the COVID-19 Pandemic (e.g. social distancing) in an effort to limit this patient's exposure and mitigate transmission in our community.  Due to his co-morbid illnesses, this patient is at least at moderate risk for complications without adequate follow up.  This format is felt to be most appropriate for this patient at this time.  The patient did not have access to video technology/had technical difficulties with video requiring transitioning to audio format only (telephone).  All issues noted in this document were discussed and addressed.  No physical exam could be performed with this format.  Please refer to the patient's chart for his  consent to telehealth for [facility]   Date:  11/30/2018   ID:  Victor Estes, DOB September 15, 1945, MRN [id number]  Patient Location: Home Provider Location: Home  PCP:  Garner Gavel, MD  Cardiologist: Dr. Quay Burow Electrophysiologist:  None   Evaluation Performed:  Follow-Up Visit  Chief Complaint: Follow-up CAD and dyspnea on exertion  History of Present Illness:    Victor Estes is a 73y.o.  mildly overweight, married Caucasian male, father of 2, grandfather to 5 grandchildren who I have been taking care of for the last 23 years. I last saw him in the office  08/30/2018.Marland KitchenHe had an MI back in 1993 and subsequent coronary artery bypass grafting x4 in 1995. His risk factors are remarkable for remote tobacco abuse, hypertension, hyperlipidemia, and family history. His last Myoview performed 2 year ago showed inferior scar without ischemia, unchanged from prior studies. A 2D echo revealed an EF of 45% to 50% with inferior and septal hypokinesia. Carotid Dopplers showed moderate right ICA stenosis unchanged from prior studies. He is neurologically asymptomatic. Recent lab work performed bythe [facility] in  [place] 03/31/13 revealed a total cholesterol of 141, LDL 61 HDL 63. He denies chest pain or shortness of breath. He had elective TKR replacement by Dr. Percell Miller 07/05/13. I obtained a Myoview stress test 03/29/13 that showed impaired scar without ischemia. The epicardium slightly to 40% by quantitative gated SPECT. A 2-D echocardiogram performed 05/24/13 revealed an ejection fraction of 40-45%.. Since I saw him a year ago he's remained clinically stable. He gets a rare episode of dizziness on the golf course. 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 He was admitted to [facility] on 04/27/15 with sustained ventricular tachycardia requiring cardioversion. He had a cardiac catheterization by Dr. Burt Knack revealing stable anatomy without a "culprit lesion and therefore his VT was thought to be "scar related". Based on this, Dr. Caryl Comes placed a ICD for secondary prevention as follow this up as an outpatient since. He continues to do well and was asymptomatic. Since I saw him back 1 year ago, his only complaints are some increasing dyspnea on exertion but denies chest pain.  His last 2D echo performed 04/30/2015 revealed an EF of 40 to 45% with mild AI and MR..  I had ordered a repeat 2D echo which was never performed.  Since I saw him 3 months ago his shortness of breath has resolved spontaneously.  He is now golfing and working without limitation.  He denies chest pain.  The patient does not have symptoms concerning for COVID-19 infection (fever, chills, cough, or new shortness of breath).    Past Medical History:  Diagnosis Date   Cancer [facility])    Carotid artery disease (  [place])    GERD (gastroesophageal reflux disease)    Hyperlipemia    Hypertension    Dr Gwenlyn Found   Myocardial infarct [facility])    CABG-1995- LIMA-D1-LAD, SVG-OM, SVG-RCA    Ventricular tachycardia, sustained ([place]) 04/27/2015   Past Surgical History:  Procedure Laterality Date   BACK SURGERY     CARDIAC  CATHETERIZATION N/A 04/27/2015   Severe native dz, LIMA-LAD & SVG-OM OK, SVG-RCA chronically occluded. Left Heart Cath and Coronary Angiography;  Surgeon: Sherren Mocha, MD;  Location: [place] CV LAB;  Service: Cardiovascular;  Laterality: N/A;   CHOLECYSTECTOMY     COLON SURGERY     CORONARY ARTERY BYPASS GRAFT  1995    LIMA-D1-LAD, SVG-OM, SVG-RCA, Dr Redmond Pulling   ELBOW SURGERY     EP IMPLANTABLE DEVICE N/A 04/29/2015   Procedure: ICD Implant;  Surgeon: Deboraha Sprang, MD;  Location: [place] CV LAB;  Service: Cardiovascular;  Laterality: N/A; St Jude ICD, serial number [id number].   LUMBAR LAMINECTOMY/DECOMPRESSION MICRODISCECTOMY  09/09/2011   Procedure: LUMBAR LAMINECTOMY/DECOMPRESSION MICRODISCECTOMY 1 LEVEL;  Surgeon: Eustace Moore, MD;  Location: [place] NEURO ORS;  Service: Neurosurgery;  Laterality: Right;  Right Lumbar Five-Sacral One Hemilaminectomy    TOTAL KNEE ARTHROPLASTY Left 07/26/2013   Procedure: TOTAL KNEE ARTHROPLASTY;  Surgeon: Ninetta Lights, MD;  Location: [place];  Service: Orthopedics;  Laterality: Left;     Current Meds  Medication Sig   acetaminophen (TYLENOL) 325 MG tablet Take 650 mg by mouth every 6 (six) hours as needed for mild pain, moderate pain, fever or headache.   amLODipine (NORVASC) 5 MG tablet TAKE 1 TABLET(5 MG) BY MOUTH DAILY   aspirin EC 81 MG EC tablet Take 1 tablet (81 mg total) by mouth daily with breakfast.   atorvastatin (LIPITOR) 40 MG tablet Take 1 tablet (40 mg total) by mouth at bedtime. Or at supper   carvedilol (COREG) 6.25 MG tablet Take 1 tablet (6.25 mg total) by mouth 2 (two) times daily with a meal.   Cholecalciferol 2000 UNITS TABS Take 2,000 Units by mouth daily.   clopidogrel (PLAVIX) 75 MG tablet TAKE 1 TABLET(75 MG) BY MOUTH DAILY   ezetimibe (ZETIA) 10 MG tablet TAKE 1 TABLET(10 MG) BY MOUTH DAILY   lisinopril (PRINIVIL,ZESTRIL) 20 MG tablet Take 1 tablet (20 mg total) by mouth every morning.   methocarbamol  (ROBAXIN) 500 MG tablet Take 1 tablet (500 mg total) by mouth every 6 (six) hours as needed for muscle spasms.   pantoprazole (PROTONIX) 40 MG tablet Take 40 mg by mouth 2 (two) times daily.    tamsulosin (FLOMAX) 0.4 MG CAPS capsule Take 0.4 mg by mouth daily after breakfast.   traMADol (ULTRAM) 50 MG tablet Take 50 mg by mouth every 6 (six) hours as needed for moderate pain or severe pain.     Allergies:   Clarithromycin   Social History   Tobacco Use   Smoking status: Former Smoker    Last attempt to quit: 04/06/1983    Years since quitting: 35.6   Smokeless tobacco: Never Used  Substance Use Topics   Alcohol use: No    Alcohol/week: 0.0 standard drinks   Drug use: No     Family Hx: The patient's family history includes Heart failure in his mother; Hypertension in his mother and another family member.  ROS:   Please see the history of present illness.     All other systems reviewed and are negative.   Prior CV studies:  The following studies were reviewed today:  None  Labs/Other Tests and Data Reviewed:    EKG:  No ECG reviewed.  Recent Labs: No results found for requested labs within last 8760 hours.   Recent Lipid Panel Lab Results  Component Value Date/Time   CHOL 110 10/05/2017 08:51 AM   TRIG 89 10/05/2017 08:51 AM   HDL 39 (L) 10/05/2017 08:51 AM   CHOLHDL 2.8 10/05/2017 08:51 AM   LDLCALC 53 10/05/2017 08:51 AM    Wt Readings from Last 3 Encounters:  11/30/18 194 lb (88 kg)  08/30/18 201 lb 3.2 oz (91.3 kg)  09/14/17 196 lb (88.9 kg)     Objective:    Vital Signs:  BP 121/64    Pulse 62    Ht 5\' 8"  (1.727 m)    Wt 194 lb (88 kg)    BMI 29.50 kg/m    VITAL SIGNS:  reviewed a complete physical exam was not performed today since this was a virtual telemedicine phone visit  ASSESSMENT & PLAN:    1. Coronary artery disease- history of CAD status post myocardial infarction back in 1993 with subsequent CABG x4 in 1995.  Dr. Burt Knack  performed cardiac catheterization on him 04/27/2015 revealing stable anatomy without a culprit lesion. 2. Ventricular tachycardia- status post ICD implantation by Dr. Caryl Comes 3. Essential hypertension- history of essential hypertension with blood pressure measured by the patient at home today at 121/64 with a pulse of 62.  He is on amlodipine, lisinopril and carvedilol 4. Hyperlipidemia-history of hyperlipidemia on atorvastatin and Zetia with lipid profile performed 10/05/2017 revealing total cholesterol 110, LDL 53 and HDL 39 5. Carotid artery disease- history of carotid artery disease by duplex ultrasound performed 08/19/2018 with moderate right internal and mild left internal carotid artery stenosis.  This will be repeated on an annual basis.  COVID-19 Education: The signs and symptoms of COVID-19 were discussed with the patient and how to seek care for testing (follow up with PCP or arrange E-visit).  The importance of social distancing was discussed today.  Time:   Today, I have spent 7 minutes with the patient with telehealth technology discussing the above problems.     Medication Adjustments/Labs and Tests Ordered: Current medicines are reviewed at length with the patient today.  Concerns regarding medicines are outlined above.   Tests Ordered: No orders of the defined types were placed in this encounter.   Medication Changes: No orders of the defined types were placed in this encounter.   Disposition:  Follow up in 6 month(s)  Signed, Quay Burow, MD  11/30/2018 8:35 AM    [facility]

## 2018-11-30 NOTE — Patient Instructions (Addendum)
Medication Instructions:  Your physician recommends that you continue on your current medications as directed. Please refer to the Current Medication list given to you today.  If you need a refill on your cardiac medications before your next appointment, please call your pharmacy.   Lab work: NONE If you have labs (blood work) drawn today and your tests are completely normal, you will receive your results only by: [name] MyChart Message (if you have MyChart) OR . A paper copy in the mail If you have any lab test that is abnormal or we need to change your treatment, we will call you to review the results.  Testing/Procedures: Your physician has requested that you have a carotid duplex. This test is an ultrasound of the carotid arteries in your neck. It looks at blood flow through these arteries that supply the brain with blood. Allow one hour for this exam. There are no restrictions or special instructions.  TO BE SCHEDULED FOR February 2021. YOU WILL BE CONTACTED BY A SCHEDULER TO SET UP THIS APPOINTMENT.  Follow-Up: At [facility], you and your health needs are our priority.  As part of our continuing mission to provide you with exceptional heart care, we have created designated Provider Care Teams.  These Care Teams include your primary Cardiologist (physician) and Advanced Practice Providers (APPs -  Physician Assistants and Nurse Practitioners) who all work together to provide you with the care you need, when you need it. You will need a follow up appointment in 6 months with Dr. Gwenlyn Found.  Please call our office 2 months in advance to schedule this appointment.

## 2018-11-30 NOTE — Telephone Encounter (Signed)
Left detailed message on pt VM per DPR regarding 6/3 AVS. Letter including After Visit Summary and any other necessary documents to be mailed to the patient's address on file.

## 2018-12-26 ENCOUNTER — Ambulatory Visit (INDEPENDENT_AMBULATORY_CARE_PROVIDER_SITE_OTHER): Payer: Medicare Other | Admitting: *Deleted

## 2018-12-26 DIAGNOSIS — I255 Ischemic cardiomyopathy: Secondary | ICD-10-CM

## 2018-12-27 ENCOUNTER — Telehealth: Payer: Self-pay

## 2018-12-27 NOTE — Telephone Encounter (Signed)
Left message for patient to remind of missed remote transmission.  

## 2018-12-29 LAB — CUP PACEART REMOTE DEVICE CHECK
Battery Remaining Longevity: 74 mo
Battery Remaining Percentage: 71 %
Battery Voltage: 2.98 V
Brady Statistic RV Percent Paced: 2.2 %
Date Time Interrogation Session: 20200702053238
HighPow Impedance: 65 Ohm
HighPow Impedance: 65 Ohm
Implantable Lead Implant Date: 20161031
Implantable Lead Location: 753860
Implantable Lead Model: 181
Implantable Lead Serial Number: 333140
Implantable Pulse Generator Implant Date: 20161031
Lead Channel Impedance Value: 330 Ohm
Lead Channel Sensing Intrinsic Amplitude: 8.9 mV
Lead Channel Setting Pacing Amplitude: 2.5 V
Lead Channel Setting Pacing Pulse Width: 0.5 ms
Lead Channel Setting Sensing Sensitivity: 0.5 mV
Pulse Gen Serial Number: 7308468

## 2019-01-07 ENCOUNTER — Encounter: Payer: Self-pay | Admitting: Cardiology

## 2019-01-07 NOTE — Progress Notes (Signed)
Remote ICD transmission.   

## 2019-02-06 ENCOUNTER — Telehealth: Payer: Self-pay | Admitting: Cardiovascular Disease

## 2019-02-06 NOTE — Telephone Encounter (Signed)
   Primary Cardiologist: Quay Burow, MD  Chart reviewed as part of pre-operative protocol coverage. Simple dental extractions are considered low risk procedures per guidelines and generally do not require any specific cardiac clearance. It is also generally accepted that for simple extractions and dental cleanings, there is no need to interrupt blood thinner therapy.   However, given that there is the possibility of a dental implant, I called the patient on 02/06/19 for medical clearance. He was last seen on 11/30/18 by Dr. Gwenlyn Found and was doing well at that time.  He can complete more than 4.0 METS without anginal complaints. He stopped his ASA on his own on 02/01/19. He is still taking plavix. I have advised him to resume ASA when safe to do so per the surgeon.   SBE prophylaxis is not required for the patient.  I will route this recommendation to the requesting party via Epic fax function and remove from pre-op pool.  Please call with questions.  [place], PA 02/06/2019, 2:43 PM

## 2019-02-06 NOTE — Telephone Encounter (Signed)
  1. What dental office are you calling from? Dr Imagene Riches  2. What is your office phone number? [phone] or office [phone]  3. What is your fax number? [phone]  4. What type of procedure is the patient having performed? Tooth extraction x 2, possible dental implant  5. What date is procedure scheduled or is the patient there now? 02/07/19 (if the patient is at the dentist's office question goes to their cardiologist if he/she is in the office.  If not, question should go to the DOD).   6. What is your question (ex. Antibiotics prior to procedure, holding medication-we need to know how long dentist wants pt to hold med)? Is patient medically okay to have procedure

## 2019-03-21 DIAGNOSIS — S46012A Strain of muscle(s) and tendon(s) of the rotator cuff of left shoulder, initial encounter: Secondary | ICD-10-CM | POA: Diagnosis not present

## 2019-03-27 ENCOUNTER — Ambulatory Visit (INDEPENDENT_AMBULATORY_CARE_PROVIDER_SITE_OTHER): Payer: Medicare Other | Admitting: *Deleted

## 2019-03-27 DIAGNOSIS — I440 Atrioventricular block, first degree: Secondary | ICD-10-CM | POA: Diagnosis not present

## 2019-03-27 DIAGNOSIS — I472 Ventricular tachycardia, unspecified: Secondary | ICD-10-CM

## 2019-03-27 LAB — CUP PACEART REMOTE DEVICE CHECK
Battery Remaining Longevity: 73 mo
Battery Remaining Percentage: 70 %
Battery Voltage: 2.96 V
Brady Statistic RV Percent Paced: 2 %
Date Time Interrogation Session: 20200928060019
HighPow Impedance: 70 Ohm
HighPow Impedance: 70 Ohm
Implantable Lead Implant Date: 20161031
Implantable Lead Location: 753860
Implantable Lead Model: 181
Implantable Lead Serial Number: 333140
Implantable Pulse Generator Implant Date: 20161031
Lead Channel Impedance Value: 340 Ohm
Lead Channel Pacing Threshold Amplitude: 1 V
Lead Channel Pacing Threshold Pulse Width: 0.5 ms
Lead Channel Sensing Intrinsic Amplitude: 8.9 mV
Lead Channel Setting Pacing Amplitude: 2.5 V
Lead Channel Setting Pacing Pulse Width: 0.5 ms
Lead Channel Setting Sensing Sensitivity: 0.5 mV
Pulse Gen Serial Number: 7308468

## 2019-04-04 ENCOUNTER — Other Ambulatory Visit: Payer: Self-pay | Admitting: Cardiovascular Disease

## 2019-04-05 NOTE — Progress Notes (Signed)
Remote ICD transmission.   

## 2019-05-08 DIAGNOSIS — M25552 Pain in left hip: Secondary | ICD-10-CM | POA: Diagnosis not present

## 2019-05-08 DIAGNOSIS — M545 Low back pain: Secondary | ICD-10-CM | POA: Diagnosis not present

## 2019-05-24 IMAGING — CT CT SHOULDER*R* W/CM
1 series · 12 of 14 positions shown, 15 images · non-contrast
Comparison: Image from contrast injection review.

CLINICAL DATA: Right shoulder pain and decreased range of motion
for 2 months. No known injury. Question rotator cuff tear.

EXAM:
CT ARTHROGRAPHY OF THE RIGHT SHOULDER
TECHNIQUE: Multidetector CT imaging was performed following the standard
protocol after injection of dilute contrast into the joint.

[Series 2: soft tissue · axial · 0.53mm/px · z∈[-208,-46]mm · 12 of 97 slices shown, 15 images]
[im 8/97  soft-tissue]
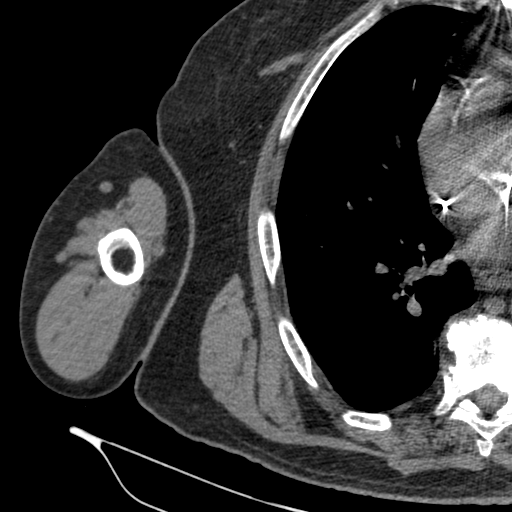
[im 8/97  bone]
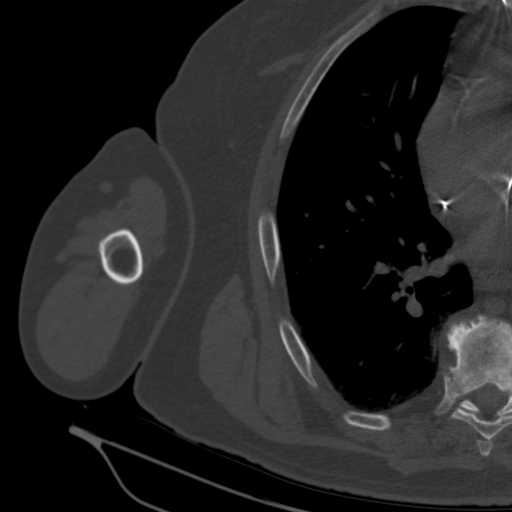
[im 15/97  bone]
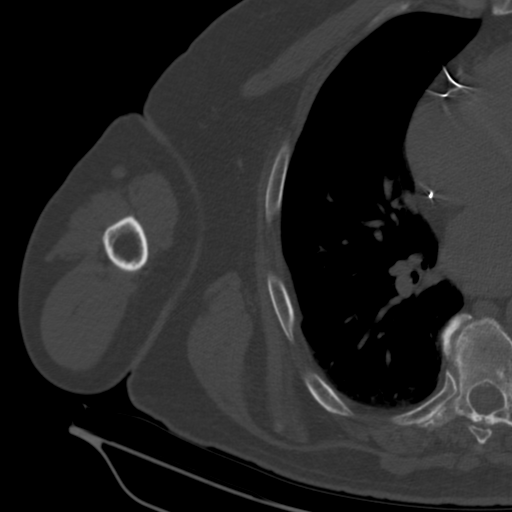
[im 23/97  bone]
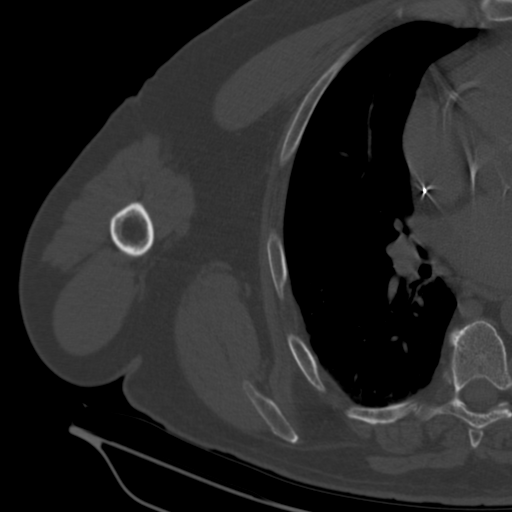
[im 30/97  bone]
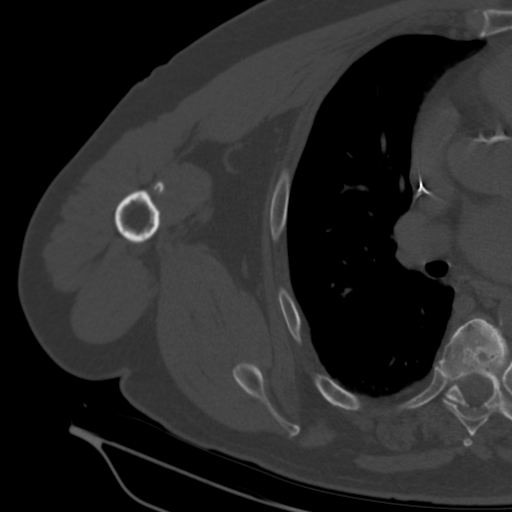
[im 37/97  soft-tissue]
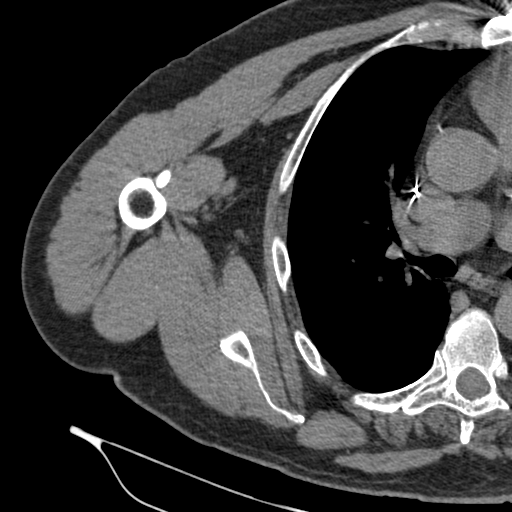
[im 37/97  bone]
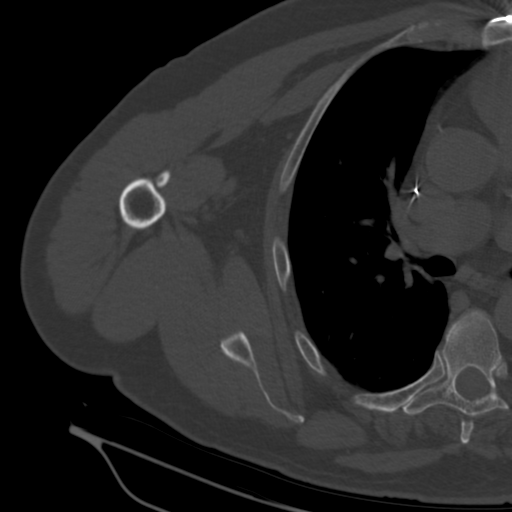
[im 45/97  bone]
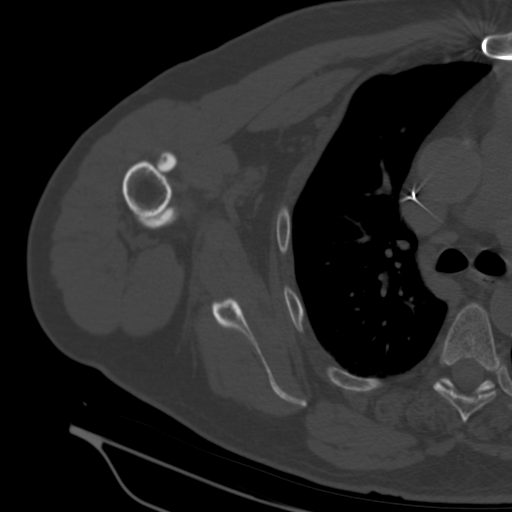
[im 52/97  bone]
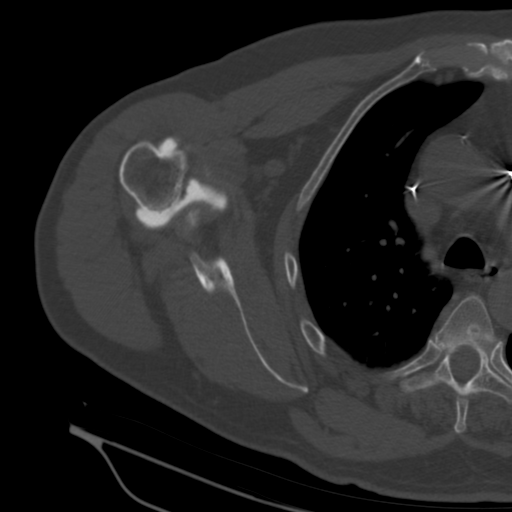
[im 60/97  bone]
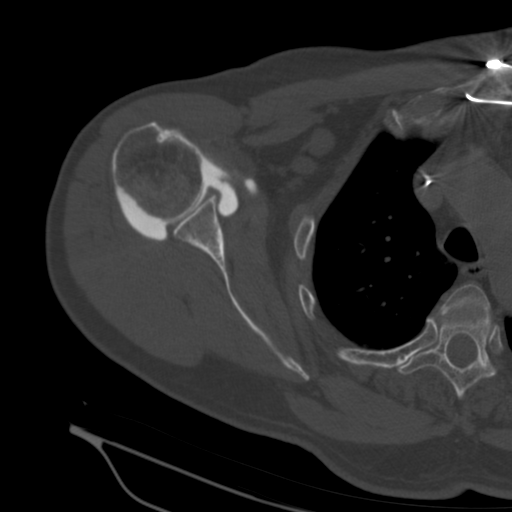
[im 67/97  soft-tissue]
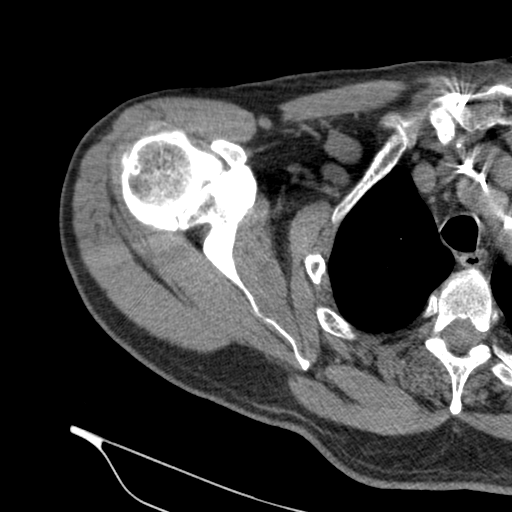
[im 67/97  bone]
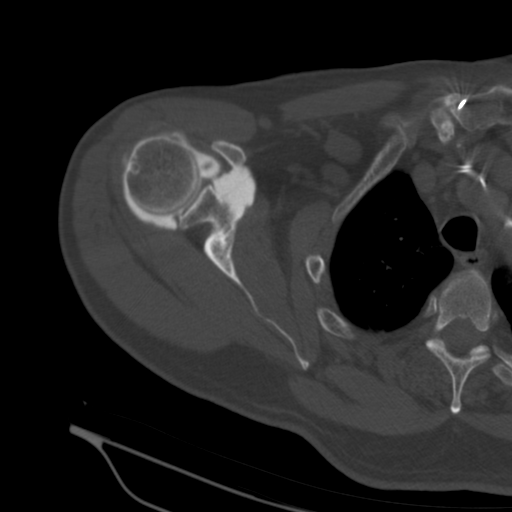
[im 74/97  bone]
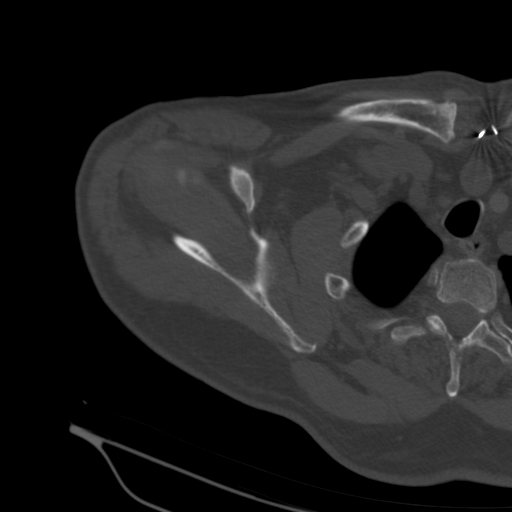
[im 82/97  bone]
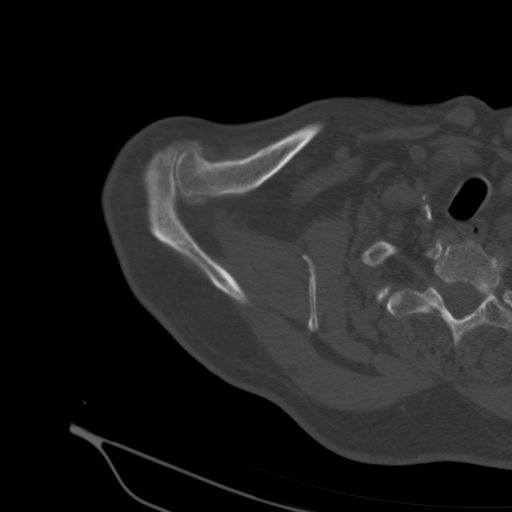
[im 89/97  bone]
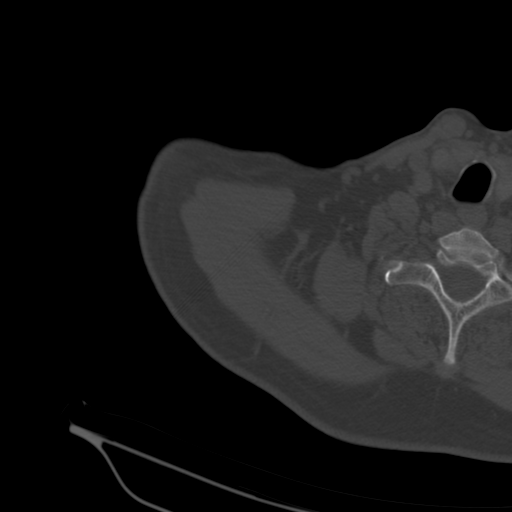

[12 of 14 positions shown; findings below may reference images not displayed]

FINDINGS: The subscapularis tendon is almost completely torn and retracted 2-3
cm, to the level of the glenoid. Approximately the inferior 30% of
the tendon appears intact. The supraspinatus, infraspinatus and
teres minor are intact. Rotator cuff musculature is preserved.

The long head of biceps is completely torn with a small stump of the
tendon seen attaching to the superior labrum. The superior labrum is
blunted but no labral tear is identified.

Cartilage of the glenohumeral joint appears preserved. Bulky
acromioclavicular osteoarthritis creates some mass effect on the
supraspinatus muscle belly. The acromion is type 2. No contrast
extravasates into the subacromial/subdeltoid bursa and no fluid is
seen in the bursa.

Imaged intrathoracic contents demonstrate leads from pacing device.
Imaged lung parenchyma is clear.
IMPRESSION: Complete tear of the long head of biceps with a small stump of
tendon seen attaching to the superior labrum.

Near complete tear of the subscapularis with 2-3 cm of retraction
and no atrophy. Approximately 30% of the inferior fibers of the
tendon are intact.

Intact supraspinatus and infraspinatus tendons.

Bulky acromioclavicular osteoarthritis create some mass effect on
the supraspinatus.

## 2019-06-26 ENCOUNTER — Ambulatory Visit (INDEPENDENT_AMBULATORY_CARE_PROVIDER_SITE_OTHER): Payer: Medicare Other | Admitting: *Deleted

## 2019-06-26 DIAGNOSIS — I255 Ischemic cardiomyopathy: Secondary | ICD-10-CM | POA: Diagnosis not present

## 2019-06-26 LAB — CUP PACEART REMOTE DEVICE CHECK
Battery Remaining Longevity: 70 mo
Battery Remaining Percentage: 68 %
Battery Voltage: 2.96 V
Brady Statistic RV Percent Paced: 2.5 %
Date Time Interrogation Session: 20201228020015
HighPow Impedance: 72 Ohm
HighPow Impedance: 72 Ohm
Implantable Lead Implant Date: 20161031
Implantable Lead Location: 753860
Implantable Lead Model: 181
Implantable Lead Serial Number: 333140
Implantable Pulse Generator Implant Date: 20161031
Lead Channel Impedance Value: 330 Ohm
Lead Channel Pacing Threshold Amplitude: 1 V
Lead Channel Pacing Threshold Pulse Width: 0.5 ms
Lead Channel Sensing Intrinsic Amplitude: 10.7 mV
Lead Channel Setting Pacing Amplitude: 2.5 V
Lead Channel Setting Pacing Pulse Width: 0.5 ms
Lead Channel Setting Sensing Sensitivity: 0.5 mV
Pulse Gen Serial Number: 7308468

## 2019-06-27 NOTE — Progress Notes (Signed)
ICD remote 

## 2019-07-07 ENCOUNTER — Other Ambulatory Visit: Payer: Self-pay | Admitting: Cardiovascular Disease

## 2019-08-23 ENCOUNTER — Encounter (INDEPENDENT_AMBULATORY_CARE_PROVIDER_SITE_OTHER): Payer: Self-pay

## 2019-08-23 ENCOUNTER — Ambulatory Visit (HOSPITAL_COMMUNITY)
Admission: RE | Admit: 2019-08-23 | Discharge: 2019-08-23 | Disposition: A | Discharge status: Home / Self Care | Payer: Medicare Other | Source: Ambulatory Visit | Attending: Cardiovascular Disease | Admitting: Cardiovascular Disease

## 2019-08-23 ENCOUNTER — Other Ambulatory Visit: Payer: Self-pay

## 2019-08-23 DIAGNOSIS — I6523 Occlusion and stenosis of bilateral carotid arteries: Secondary | ICD-10-CM | POA: Diagnosis not present

## 2019-08-25 DIAGNOSIS — I2510 Atherosclerotic heart disease of native coronary artery without angina pectoris: Secondary | ICD-10-CM

## 2019-09-08 ENCOUNTER — Other Ambulatory Visit: Payer: Self-pay

## 2019-09-08 MED ORDER — EZETIMIBE 10 MG PO TABS: ORAL_TABLET | ORAL | 2 refills | Status: DC

## 2019-09-25 ENCOUNTER — Ambulatory Visit (INDEPENDENT_AMBULATORY_CARE_PROVIDER_SITE_OTHER): Payer: Medicare Other | Admitting: *Deleted

## 2019-09-25 DIAGNOSIS — I255 Ischemic cardiomyopathy: Secondary | ICD-10-CM | POA: Diagnosis not present

## 2019-09-25 LAB — CUP PACEART REMOTE DEVICE CHECK
Battery Remaining Longevity: 69 mo
Battery Remaining Percentage: 66 %
Battery Voltage: 2.96 V
Brady Statistic RV Percent Paced: 2.4 %
Date Time Interrogation Session: 20210329020018
HighPow Impedance: 70 Ohm
HighPow Impedance: 70 Ohm
Implantable Lead Implant Date: 20161031
Implantable Lead Location: 753860
Implantable Lead Model: 181
Implantable Lead Serial Number: 333140
Implantable Pulse Generator Implant Date: 20161031
Lead Channel Impedance Value: 330 Ohm
Lead Channel Pacing Threshold Amplitude: 1 V
Lead Channel Pacing Threshold Pulse Width: 0.5 ms
Lead Channel Sensing Intrinsic Amplitude: 9.1 mV
Lead Channel Setting Pacing Amplitude: 2.5 V
Lead Channel Setting Pacing Pulse Width: 0.5 ms
Lead Channel Setting Sensing Sensitivity: 0.5 mV
Pulse Gen Serial Number: 7308468

## 2019-09-25 NOTE — Progress Notes (Signed)
ICD Remote  

## 2019-10-09 DIAGNOSIS — M25561 Pain in right knee: Secondary | ICD-10-CM | POA: Diagnosis not present

## 2019-12-25 ENCOUNTER — Ambulatory Visit (INDEPENDENT_AMBULATORY_CARE_PROVIDER_SITE_OTHER): Payer: Medicare Other | Admitting: *Deleted

## 2019-12-25 DIAGNOSIS — I472 Ventricular tachycardia, unspecified: Secondary | ICD-10-CM

## 2019-12-25 LAB — CUP PACEART REMOTE DEVICE CHECK
Battery Remaining Longevity: 67 mo
Battery Remaining Percentage: 64 %
Battery Voltage: 2.95 V
Brady Statistic RV Percent Paced: 2.6 %
Date Time Interrogation Session: 20210628020017
HighPow Impedance: 72 Ohm
HighPow Impedance: 72 Ohm
Implantable Lead Implant Date: 20161031
Implantable Lead Location: 753860
Implantable Lead Model: 181
Implantable Lead Serial Number: 333140
Implantable Pulse Generator Implant Date: 20161031
Lead Channel Impedance Value: 330 Ohm
Lead Channel Pacing Threshold Amplitude: 1 V
Lead Channel Pacing Threshold Pulse Width: 0.5 ms
Lead Channel Sensing Intrinsic Amplitude: 9.7 mV
Lead Channel Setting Pacing Amplitude: 2.5 V
Lead Channel Setting Pacing Pulse Width: 0.5 ms
Lead Channel Setting Sensing Sensitivity: 0.5 mV
Pulse Gen Serial Number: 7308468

## 2019-12-27 NOTE — Progress Notes (Signed)
Remote ICD transmission.   

## 2020-01-17 ENCOUNTER — Other Ambulatory Visit: Payer: Self-pay | Admitting: Cardiovascular Disease

## 2020-01-18 ENCOUNTER — Other Ambulatory Visit: Payer: Self-pay | Admitting: Cardiovascular Disease

## 2020-03-12 DIAGNOSIS — S46012A Strain of muscle(s) and tendon(s) of the rotator cuff of left shoulder, initial encounter: Secondary | ICD-10-CM | POA: Diagnosis not present

## 2020-03-25 ENCOUNTER — Ambulatory Visit (INDEPENDENT_AMBULATORY_CARE_PROVIDER_SITE_OTHER): Payer: Medicare Other | Admitting: Emergency Medicine

## 2020-03-25 DIAGNOSIS — I472 Ventricular tachycardia, unspecified: Secondary | ICD-10-CM

## 2020-03-25 LAB — CUP PACEART REMOTE DEVICE CHECK
Battery Remaining Longevity: 64 mo
Battery Remaining Percentage: 63 %
Battery Voltage: 2.95 V
Brady Statistic RV Percent Paced: 3.1 %
Date Time Interrogation Session: 20210927020017
HighPow Impedance: 71 Ohm
HighPow Impedance: 71 Ohm
Implantable Lead Implant Date: 20161031
Implantable Lead Location: 753860
Implantable Lead Model: 181
Implantable Lead Serial Number: 333140
Implantable Pulse Generator Implant Date: 20161031
Lead Channel Impedance Value: 330 Ohm
Lead Channel Pacing Threshold Amplitude: 1 V
Lead Channel Pacing Threshold Pulse Width: 0.5 ms
Lead Channel Sensing Intrinsic Amplitude: 10 mV
Lead Channel Setting Pacing Amplitude: 2.5 V
Lead Channel Setting Pacing Pulse Width: 0.5 ms
Lead Channel Setting Sensing Sensitivity: 0.5 mV
Pulse Gen Serial Number: 7308468

## 2020-03-28 NOTE — Progress Notes (Signed)
Remote ICD transmission.   

## 2020-06-20 ENCOUNTER — Other Ambulatory Visit: Payer: Self-pay | Admitting: Cardiovascular Disease

## 2020-06-24 ENCOUNTER — Ambulatory Visit (INDEPENDENT_AMBULATORY_CARE_PROVIDER_SITE_OTHER): Payer: Medicare Other

## 2020-06-24 DIAGNOSIS — I472 Ventricular tachycardia, unspecified: Secondary | ICD-10-CM

## 2020-06-25 LAB — CUP PACEART REMOTE DEVICE CHECK
Battery Remaining Longevity: 62 mo
Battery Remaining Percentage: 61 %
Battery Voltage: 2.95 V
Brady Statistic RV Percent Paced: 3.4 %
Date Time Interrogation Session: 20211227020017
HighPow Impedance: 65 Ohm
HighPow Impedance: 65 Ohm
Implantable Lead Implant Date: 20161031
Implantable Lead Location: 753860
Implantable Lead Model: 181
Implantable Lead Serial Number: 333140
Implantable Pulse Generator Implant Date: 20161031
Lead Channel Impedance Value: 310 Ohm
Lead Channel Pacing Threshold Amplitude: 1 V
Lead Channel Pacing Threshold Pulse Width: 0.5 ms
Lead Channel Sensing Intrinsic Amplitude: 6.9 mV
Lead Channel Setting Pacing Amplitude: 2.5 V
Lead Channel Setting Pacing Pulse Width: 0.5 ms
Lead Channel Setting Sensing Sensitivity: 0.5 mV
Pulse Gen Serial Number: 7308468

## 2020-07-05 NOTE — Progress Notes (Signed)
Remote ICD transmission.   

## 2020-07-24 DIAGNOSIS — U071 COVID-19: Secondary | ICD-10-CM | POA: Diagnosis not present

## 2020-07-24 DIAGNOSIS — B349 Viral infection, unspecified: Secondary | ICD-10-CM | POA: Diagnosis not present

## 2020-07-24 DIAGNOSIS — J209 Acute bronchitis, unspecified: Secondary | ICD-10-CM | POA: Diagnosis not present

## 2020-08-16 DIAGNOSIS — U099 Post covid-19 condition, unspecified: Secondary | ICD-10-CM | POA: Diagnosis not present

## 2020-08-16 DIAGNOSIS — J1282 Pneumonia due to coronavirus disease 2019: Secondary | ICD-10-CM | POA: Diagnosis not present

## 2020-08-19 DIAGNOSIS — J1282 Pneumonia due to coronavirus disease 2019: Secondary | ICD-10-CM | POA: Diagnosis not present

## 2020-08-19 DIAGNOSIS — U099 Post covid-19 condition, unspecified: Secondary | ICD-10-CM | POA: Diagnosis not present

## 2020-08-22 ENCOUNTER — Other Ambulatory Visit: Payer: Self-pay

## 2020-08-22 ENCOUNTER — Other Ambulatory Visit (HOSPITAL_COMMUNITY): Payer: Self-pay | Admitting: Cardiovascular Disease

## 2020-08-22 ENCOUNTER — Other Ambulatory Visit (HOSPITAL_COMMUNITY): Payer: Self-pay | Admitting: Surgery

## 2020-08-22 ENCOUNTER — Ambulatory Visit (HOSPITAL_COMMUNITY)
Admission: RE | Admit: 2020-08-22 | Discharge: 2020-08-22 | Disposition: A | Discharge status: Home / Self Care | Payer: Medicare Other | Source: Ambulatory Visit | Attending: Cardiovascular Disease | Admitting: Cardiovascular Disease

## 2020-08-22 DIAGNOSIS — I6523 Occlusion and stenosis of bilateral carotid arteries: Secondary | ICD-10-CM

## 2020-08-22 DIAGNOSIS — I2510 Atherosclerotic heart disease of native coronary artery without angina pectoris: Secondary | ICD-10-CM | POA: Insufficient documentation

## 2020-09-04 ENCOUNTER — Telehealth: Payer: Self-pay

## 2020-09-04 DIAGNOSIS — R0609 Other forms of dyspnea: Secondary | ICD-10-CM

## 2020-09-04 DIAGNOSIS — R0600 Dyspnea, unspecified: Secondary | ICD-10-CM

## 2020-09-04 NOTE — Telephone Encounter (Signed)
Spoke with pt on the phone to check on him and to make office visit appointment. Able to schedule pt of 09/17/20 at 2:15om to see Dr. Gwenlyn Found. Pt states that he feels ok. Advised pt to call us if he feels worst or if symptoms increase. Pt verbalized understanding.

## 2020-09-04 NOTE — Telephone Encounter (Signed)
Orders place for echo and lexiscan myoview per Dr. Gwenlyn Found. Pt is newly complaining of dyspnea on exertion per private conversation. Message sent to scheduling.

## 2020-09-04 NOTE — Telephone Encounter (Signed)
Spoke with patient to schedule the [facility] and Echocardiogram ordered by Dr. Hattie Perch scheduled Friday 09/06/20 at 12:45 pm at [facility] and Echocardiogram scheduled 09/13/20 at 9:35 am at [address].  Will mail information to patient and he voiced his understanding.

## 2020-09-05 ENCOUNTER — Telehealth (HOSPITAL_COMMUNITY): Payer: Self-pay | Admitting: *Deleted

## 2020-09-05 NOTE — Telephone Encounter (Signed)
Close encounter 

## 2020-09-06 ENCOUNTER — Ambulatory Visit (HOSPITAL_COMMUNITY)
Admission: RE | Admit: 2020-09-06 | Discharge: 2020-09-06 | Disposition: A | Discharge status: Home / Self Care | Payer: Medicare Other | Source: Ambulatory Visit | Attending: Cardiology | Admitting: Cardiology

## 2020-09-06 ENCOUNTER — Other Ambulatory Visit: Payer: Self-pay

## 2020-09-06 DIAGNOSIS — R0600 Dyspnea, unspecified: Secondary | ICD-10-CM

## 2020-09-06 DIAGNOSIS — R0609 Other forms of dyspnea: Secondary | ICD-10-CM

## 2020-09-06 LAB — MYOCARDIAL PERFUSION IMAGING
Peak HR: 86 {beats}/min
Rest HR: 65 {beats}/min
SDS: 4
SRS: 14
SSS: 18
TID: 1.13

## 2020-09-06 MED ORDER — REGADENOSON 0.4 MG/5ML IV SOLN
0.4000 mg | Freq: Once | INTRAVENOUS | Status: AC
  Administered 2020-09-06: 0.4 mg via INTRAVENOUS

## 2020-09-06 MED ORDER — TECHNETIUM TC 99M TETROFOSMIN IV KIT
29.1000 | PACK | Freq: Once | INTRAVENOUS | Status: AC | PRN
  Administered 2020-09-06: 29.1 via INTRAVENOUS
  Filled 2020-09-06: qty 30

## 2020-09-06 MED ORDER — TECHNETIUM TC 99M TETROFOSMIN IV KIT
9.2000 | PACK | Freq: Once | INTRAVENOUS | Status: AC | PRN
  Administered 2020-09-06: 9.2 via INTRAVENOUS
  Filled 2020-09-06: qty 10

## 2020-09-11 ENCOUNTER — Telehealth: Payer: Self-pay

## 2020-09-11 NOTE — Telephone Encounter (Signed)
Attempted to call pt regarding myoview results. Pt's VM is full, unable to leave message at this time.

## 2020-09-13 ENCOUNTER — Other Ambulatory Visit: Payer: Self-pay

## 2020-09-13 ENCOUNTER — Ambulatory Visit (HOSPITAL_COMMUNITY): Payer: Medicare Other | Attending: Internal Medicine

## 2020-09-13 DIAGNOSIS — R0609 Other forms of dyspnea: Secondary | ICD-10-CM

## 2020-09-13 DIAGNOSIS — R0600 Dyspnea, unspecified: Secondary | ICD-10-CM | POA: Diagnosis not present

## 2020-09-13 LAB — ECHOCARDIOGRAM COMPLETE
P 1/2 time: 615 msec
S' Lateral: 5.3 cm

## 2020-09-13 MED ORDER — PERFLUTREN LIPID MICROSPHERE
1.0000 mL | INTRAVENOUS | Status: AC | PRN
  Administered 2020-09-13: 1 mL via INTRAVENOUS

## 2020-09-17 ENCOUNTER — Encounter: Payer: Self-pay | Admitting: Cardiovascular Disease

## 2020-09-17 ENCOUNTER — Other Ambulatory Visit: Payer: Self-pay

## 2020-09-17 ENCOUNTER — Ambulatory Visit (INDEPENDENT_AMBULATORY_CARE_PROVIDER_SITE_OTHER): Payer: Medicare Other | Admitting: Cardiovascular Disease

## 2020-09-17 VITALS — BP 142/80 | HR 70 | Ht 68.0 in | Wt 190.0 lb

## 2020-09-17 DIAGNOSIS — I255 Ischemic cardiomyopathy: Secondary | ICD-10-CM

## 2020-09-17 DIAGNOSIS — R0600 Dyspnea, unspecified: Secondary | ICD-10-CM

## 2020-09-17 DIAGNOSIS — I6523 Occlusion and stenosis of bilateral carotid arteries: Secondary | ICD-10-CM

## 2020-09-17 DIAGNOSIS — I10 Essential (primary) hypertension: Secondary | ICD-10-CM

## 2020-09-17 DIAGNOSIS — I472 Ventricular tachycardia, unspecified: Secondary | ICD-10-CM

## 2020-09-17 DIAGNOSIS — E785 Hyperlipidemia, unspecified: Secondary | ICD-10-CM

## 2020-09-17 DIAGNOSIS — R0609 Other forms of dyspnea: Secondary | ICD-10-CM

## 2020-09-17 DIAGNOSIS — I2510 Atherosclerotic heart disease of native coronary artery without angina pectoris: Secondary | ICD-10-CM | POA: Diagnosis not present

## 2020-09-17 NOTE — Assessment & Plan Note (Signed)
History of ischemic cardiomyopathy with recent echo performed 09/13/2020 revealing EF of 40 to 45% with inferior and inferolateral wall motion abnormality but without significant valvular abnormalities unchanged from prior echoes.  He is on appropriate pharmacology.  He has been complaining of increasing shortness of breath recently but has had COVID-19 back in January.  This is somewhat improving over time.

## 2020-09-17 NOTE — Progress Notes (Signed)
09/17/2020 Victor Estes   Feb 24, 1946  [id number]  Primary Physician Garner Gavel, MD Primary Cardiologist: Lorretta Harp MD [name], [place]  HPI:  Victor Estes is a 75 y.o.  mildly overweight, married Caucasian male, father of 62, grandfather to 5 grandchildren who I have been taking care of for the last 30 years. I last saw him in the office 08/30/2018.Marland KitchenHe had an MI back in 1993 and subsequent coronary artery bypass grafting x4 in 1995. His risk factors are remarkable for remote tobacco abuse, hypertension, hyperlipidemia, and family history. His last Myoview performed 2 year ago showed inferior scar without ischemia, unchanged from prior studies. A 2D echo revealed an EF of 45% to 50% with inferior and septal hypokinesia. Carotid Dopplers showed moderate right ICA stenosis unchanged from prior studies. He is neurologically asymptomatic. [facility] [place] 03/31/13 revealed a total cholesterol of 141, LDL 61 HDL 63. He denies chest pain or shortness of breath. He had elective TKR replacement by Dr. Percell Miller 07/05/13. I obtained a Myoview stress test 03/29/13 that showed impaired scar without ischemia. The epicardium slightly to 40% by quantitative gated SPECT. A 2-D echocardiogram performed 05/24/13 revealed an ejection fraction of 40-45%.. Since I saw him a year ago he's remained clinically stable. He gets a rare episode of dizziness on the golf course. 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 He was admitted to [facility] on 04/27/15 with sustained ventricular tachycardia requiring cardioversion. He had a cardiac catheterization by Dr. Burt Knack revealing stable anatomy without a "culprit lesion and therefore his VT was thought to be "scar related". Based on this, Dr. Caryl Comes placed a ICD for secondary prevention as follow this up as an outpatient since.  He did develop COVID-19 back in January and has been  slowly recovering since that time.  He had a persistent cough and was treated by Dr. Jomarie Longs.  Because of dyspnea on exertion I performed 2D echocardiography on 09/13/2020 revealing ejection fraction of 40 to 45% without significant valvular abnormalities.  A Myoview stress test performed at the same time showed inferior scar without ischemia unchanged from prior studies.  His symptoms are gradually improving.  Current Meds  Medication Sig  . acetaminophen (TYLENOL) 325 MG tablet Take 650 mg by mouth every 6 (six) hours as needed for mild pain, moderate pain, fever or headache.  [name] amLODipine (NORVASC) 5 MG tablet TAKE 1 TABLET(5 MG) BY MOUTH DAILY  . aspirin EC 81 MG EC tablet Take 1 tablet (81 mg total) by mouth daily with breakfast.  . atorvastatin (LIPITOR) 40 MG tablet Take 1 tablet (40 mg total) by mouth at bedtime. Or at supper  . Carboxymethylcellulose Sodium 0.25 % SOLN INSTILL 1 DROP IN BOTH EYES TWICE A DAY  . carvedilol (COREG) 6.25 MG tablet Take 1 tablet (6.25 mg total) by mouth 2 (two) times daily with a meal.  . Cholecalciferol 2000 UNITS TABS Take 2,000 Units by mouth daily.  . clopidogrel (PLAVIX) 75 MG tablet TAKE 1 TABLET(75 MG) BY MOUTH DAILY  . ezetimibe (ZETIA) 10 MG tablet TAKE 1 TABLET(10 MG) BY MOUTH DAILY  . lisinopril (PRINIVIL,ZESTRIL) 20 MG tablet Take 1 tablet (20 mg total) by mouth every morning.  . meloxicam (MOBIC) 7.5 MG tablet Take 1 tablet by mouth 2 (two) times daily as needed.  . methocarbamol (ROBAXIN) 500 MG tablet Take 1 tablet (500 mg total) by mouth every  6 (six) hours as needed for muscle spasms.  . pantoprazole (PROTONIX) 40 MG tablet Take 40 mg by mouth 2 (two) times daily.  . tamsulosin (FLOMAX) 0.4 MG CAPS capsule Take 0.4 mg by mouth daily after breakfast.  . traMADol (ULTRAM) 50 MG tablet Take 50 mg by mouth every 6 (six) hours as needed for moderate pain or severe pain.     Allergies  Allergen Reactions  . Clarithromycin Swelling    Social  History   Socioeconomic History  . Marital status: Married    Spouse name: Not on file  . Number of children: Not on file  . Years of education: Not on file  . Highest education level: Not on file  Occupational History  . 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 Smoking status: Former Smoker    Quit date: 04/06/1983    Years since quitting: 37.4  . Smokeless tobacco: Never Used  Vaping Use  . Vaping Use: Never used  Substance and Sexual Activity  . Alcohol use: No    Alcohol/week: 0.0 standard drinks  . Drug use: No  . Sexual activity: Not on file  Other Topics Concern  . Not on file  Social History Narrative  . Not on file   Social Determinants of Health   Financial Resource Strain: Not on file  Food Insecurity: Not on file  Transportation Needs: Not on file  Physical Activity: Not on file  Stress: Not on file  Social Connections: Not on file  Intimate Partner Violence: Not on file     Review of Systems: General: negative for chills, fever, night sweats or weight changes.  Cardiovascular: negative for chest pain, dyspnea on exertion, edema, orthopnea, palpitations, paroxysmal nocturnal dyspnea or shortness of breath Dermatological: negative for rash Respiratory: negative for cough or wheezing Urologic: negative for hematuria Abdominal: negative for nausea, vomiting, diarrhea, bright red blood per rectum, melena, or hematemesis Neurologic: negative for visual changes, syncope, or dizziness All other systems reviewed and are otherwise negative except as noted above.    Blood pressure (!) 142/80, pulse 70, height 5\' 8"  (1.727 m), weight 190 lb (86.2 kg).  General appearance: alert and no distress Neck: no adenopathy, no carotid bruit, no JVD, supple, symmetrical, trachea midline and thyroid not enlarged, symmetric, no tenderness/mass/nodules Lungs: clear to auscultation bilaterally Heart: regular rate and rhythm, S1, S2 normal, no murmur, click, rub or  gallop Extremities: extremities normal, atraumatic, no cyanosis or edema Pulses: 2+ and symmetric Skin: Skin color, texture, turgor normal. No rashes or lesions Neurologic: Alert and oriented X 3, normal strength and tone. Normal symmetric reflexes. Normal coordination and gait  EKG sinus rhythm at 70 with borderline LVH, Q-wave in lead III with nonspecific ST and T wave changes.  I personally reviewed this EKG.  ASSESSMENT AND PLAN:   Coronary artery disease History of CAD status myocardial infarction back in 1993 and subsequent CABG times 10/15/1993.  He apparently had an episode of VT requiring cardioversion and ultimate cardiac catheterization by Dr. Burt Knack back in 2016 revealing stable anatomy without a culprit lesion.  He denies chest pain but is recently noticed increasing shortness of breath since his episode of Covid back in January.  Recent Myoview stress test performed 09/07/2020 showed inferior scar without ischemia similar to prior Myoview stress test.  Essential hypertension History of essential hypertension a blood pressure measured today 142/80.  He is on amlodipine, carvedilol and lisinopril.  Hyperlipidemia LDL goal <70 History of hyperlipidemia on  Zetia and statin therapy with lipid profile performed 10/05/2017 revealing total cholesterol 110, LDL 53 and HDL of 39 followed at the [facility].  He is scheduled to have this rechecked next several days.  Carotid artery disease History of carotid artery disease status post recent Doppler studies performed 08/22/2020 revealing moderately severe right ICA stenosis with normal left carotid artery.  This will be repeated in 1 year.  Ventricular tachycardia, sustained ([place]) History of sustained ventricular tachycardia status post ICD implantation by Dr. Caryl Comes for secondary prevention.  He is not had a recurrent episode.  Was thought that this was "scar VT".  Ischemic cardiomyopathy History of ischemic cardiomyopathy with recent  echo performed 09/13/2020 revealing EF of 40 to 45% with inferior and inferolateral wall motion abnormality but without significant valvular abnormalities unchanged from prior echoes.  He is on appropriate pharmacology.  He has been complaining of increasing shortness of breath recently but has had COVID-19 back in January.  This is somewhat improving over time.      Lorretta Harp MD FACP,FACC,FAHA, [facility] 09/17/2020 3:28 PM

## 2020-09-17 NOTE — Assessment & Plan Note (Signed)
History of CAD status myocardial infarction back in 1993 and subsequent CABG times 10/15/1993.  He apparently had an episode of VT requiring cardioversion and ultimate cardiac catheterization by Dr. Burt Knack back in 2016 revealing stable anatomy without a culprit lesion.  He denies chest pain but is recently noticed increasing shortness of breath since his episode of Covid back in January.  Recent Myoview stress test performed 09/07/2020 showed inferior scar without ischemia similar to prior Myoview stress test.

## 2020-09-17 NOTE — Patient Instructions (Signed)
Medication Instructions:  Your physician recommends that you continue on your current medications as directed. Please refer to the Current Medication list given to you today.  *If you need a refill on your cardiac medications before your next appointment, please call your pharmacy*   Testing/Procedures: Your physician has requested that you have a carotid duplex. This test is an ultrasound of the carotid arteries in your neck. It looks at blood flow through these arteries that supply the brain with blood. Allow one hour for this exam. There are no restrictions or special instructions. This procedure is done at [facility]. 2nd Floor. To be done in Feb 2023   Follow-Up: At [facility], you and your health needs are our priority.  As part of our continuing mission to provide you with exceptional heart care, we have created designated Provider Care Teams.  These Care Teams include your primary Cardiologist (physician) and Advanced Practice Providers (APPs -  Physician Assistants and Nurse Practitioners) who all work together to provide you with the care you need, when you need it.  We recommend signing up for the patient portal called "MyChart".  Sign up information is provided on this After Visit Summary.  MyChart is used to connect with patients for Virtual Visits (Telemedicine).  Patients are able to view lab/test results, encounter notes, upcoming appointments, etc.  Non-urgent messages can be sent to your provider as well.   To learn more about what you can do with MyChart, go to [email or web address].    Your next appointment:   3 month(s)  The format for your next appointment:   In Person  Provider:   Quay Burow, MD

## 2020-09-17 NOTE — Assessment & Plan Note (Signed)
History of carotid artery disease status post recent Doppler studies performed 08/22/2020 revealing moderately severe right ICA stenosis with normal left carotid artery.  This will be repeated in 1 year.

## 2020-09-17 NOTE — Assessment & Plan Note (Signed)
History of sustained ventricular tachycardia status post ICD implantation by Dr. Caryl Comes for secondary prevention.  He is not had a recurrent episode.  Was thought that this was "scar VT".

## 2020-09-17 NOTE — Assessment & Plan Note (Signed)
History of essential hypertension a blood pressure measured today 142/80.  He is on amlodipine, carvedilol and lisinopril.

## 2020-09-17 NOTE — Assessment & Plan Note (Signed)
History of hyperlipidemia on Zetia and statin therapy with lipid profile performed 10/05/2017 revealing total cholesterol 110, LDL 53 and HDL of 39 followed at the [facility].  He is scheduled to have this rechecked next several days.

## 2020-09-21 ENCOUNTER — Other Ambulatory Visit: Payer: Self-pay | Admitting: Cardiovascular Disease

## 2020-09-22 ENCOUNTER — Other Ambulatory Visit: Payer: Self-pay

## 2020-09-22 ENCOUNTER — Encounter (HOSPITAL_COMMUNITY): Payer: Self-pay

## 2020-09-22 ENCOUNTER — Inpatient Hospital Stay (HOSPITAL_COMMUNITY)
Admission: EM | Admit: 2020-09-22 | Discharge: 2020-09-25 | DRG: 286 | Disposition: A | Discharge status: Home / Self Care | Payer: Medicare Other | Attending: Internal Medicine | Admitting: Internal Medicine

## 2020-09-22 ENCOUNTER — Emergency Department (HOSPITAL_COMMUNITY): Payer: Medicare Other

## 2020-09-22 DIAGNOSIS — I2489 Other forms of acute ischemic heart disease: Secondary | ICD-10-CM

## 2020-09-22 DIAGNOSIS — I472 Ventricular tachycardia: Secondary | ICD-10-CM | POA: Diagnosis present

## 2020-09-22 DIAGNOSIS — Z791 Long term (current) use of non-steroidal anti-inflammatories (NSAID): Secondary | ICD-10-CM

## 2020-09-22 DIAGNOSIS — I959 Hypotension, unspecified: Secondary | ICD-10-CM | POA: Diagnosis present

## 2020-09-22 DIAGNOSIS — I5043 Acute on chronic combined systolic (congestive) and diastolic (congestive) heart failure: Secondary | ICD-10-CM | POA: Diagnosis not present

## 2020-09-22 DIAGNOSIS — Z20822 Contact with and (suspected) exposure to covid-19: Secondary | ICD-10-CM | POA: Diagnosis not present

## 2020-09-22 DIAGNOSIS — E785 Hyperlipidemia, unspecified: Secondary | ICD-10-CM | POA: Diagnosis present

## 2020-09-22 DIAGNOSIS — I110 Hypertensive heart disease with heart failure: Principal | ICD-10-CM | POA: Diagnosis present

## 2020-09-22 DIAGNOSIS — I200 Unstable angina: Secondary | ICD-10-CM

## 2020-09-22 DIAGNOSIS — I25110 Atherosclerotic heart disease of native coronary artery with unstable angina pectoris: Secondary | ICD-10-CM | POA: Diagnosis present

## 2020-09-22 DIAGNOSIS — Z7982 Long term (current) use of aspirin: Secondary | ICD-10-CM

## 2020-09-22 DIAGNOSIS — I252 Old myocardial infarction: Secondary | ICD-10-CM

## 2020-09-22 DIAGNOSIS — Z79899 Other long term (current) drug therapy: Secondary | ICD-10-CM

## 2020-09-22 DIAGNOSIS — I509 Heart failure, unspecified: Secondary | ICD-10-CM | POA: Insufficient documentation

## 2020-09-22 DIAGNOSIS — I10 Essential (primary) hypertension: Secondary | ICD-10-CM | POA: Diagnosis not present

## 2020-09-22 DIAGNOSIS — Z7902 Long term (current) use of antithrombotics/antiplatelets: Secondary | ICD-10-CM

## 2020-09-22 DIAGNOSIS — I493 Ventricular premature depolarization: Secondary | ICD-10-CM | POA: Diagnosis present

## 2020-09-22 DIAGNOSIS — Z8679 Personal history of other diseases of the circulatory system: Secondary | ICD-10-CM

## 2020-09-22 DIAGNOSIS — K219 Gastro-esophageal reflux disease without esophagitis: Secondary | ICD-10-CM | POA: Diagnosis present

## 2020-09-22 DIAGNOSIS — I248 Other forms of acute ischemic heart disease: Secondary | ICD-10-CM | POA: Diagnosis not present

## 2020-09-22 DIAGNOSIS — I255 Ischemic cardiomyopathy: Secondary | ICD-10-CM | POA: Diagnosis present

## 2020-09-22 DIAGNOSIS — E876 Hypokalemia: Secondary | ICD-10-CM | POA: Diagnosis present

## 2020-09-22 DIAGNOSIS — R0602 Shortness of breath: Secondary | ICD-10-CM | POA: Diagnosis not present

## 2020-09-22 DIAGNOSIS — Z888 Allergy status to other drugs, medicaments and biological substances status: Secondary | ICD-10-CM

## 2020-09-22 DIAGNOSIS — R0609 Other forms of dyspnea: Secondary | ICD-10-CM

## 2020-09-22 DIAGNOSIS — Z95810 Presence of automatic (implantable) cardiac defibrillator: Secondary | ICD-10-CM

## 2020-09-22 DIAGNOSIS — G479 Sleep disorder, unspecified: Secondary | ICD-10-CM | POA: Diagnosis present

## 2020-09-22 DIAGNOSIS — R0600 Dyspnea, unspecified: Secondary | ICD-10-CM | POA: Diagnosis not present

## 2020-09-22 DIAGNOSIS — I2510 Atherosclerotic heart disease of native coronary artery without angina pectoris: Secondary | ICD-10-CM

## 2020-09-22 DIAGNOSIS — Z8249 Family history of ischemic heart disease and other diseases of the circulatory system: Secondary | ICD-10-CM

## 2020-09-22 LAB — CBC WITH DIFFERENTIAL/PLATELET
Abs Immature Granulocytes: 0.06 10*3/uL (ref 0.00–0.07)
Basophils Absolute: 0.1 10*3/uL (ref 0.0–0.1)
Basophils Relative: 0 %
Eosinophils Absolute: 0 10*3/uL (ref 0.0–0.5)
Eosinophils Relative: 0 %
HCT: 42.6 % (ref 39.0–52.0)
Hemoglobin: 13.7 g/dL (ref 13.0–17.0)
Immature Granulocytes: 1 %
Lymphocytes Relative: 10 %
Lymphs Abs: 1.2 10*3/uL (ref 0.7–4.0)
MCH: 29.2 pg (ref 26.0–34.0)
MCHC: 32.2 g/dL (ref 30.0–36.0)
MCV: 90.8 fL (ref 80.0–100.0)
Monocytes Absolute: 0.8 10*3/uL (ref 0.1–1.0)
Monocytes Relative: 7 %
Neutro Abs: 10 10*3/uL — ABNORMAL HIGH (ref 1.7–7.7)
Neutrophils Relative %: 82 %
Platelets: 301 10*3/uL (ref 150–400)
RBC: 4.69 MIL/uL (ref 4.22–5.81)
RDW: 14.5 % (ref 11.5–15.5)
WBC: 12.2 10*3/uL — ABNORMAL HIGH (ref 4.0–10.5)
nRBC: 0 % (ref 0.0–0.2)

## 2020-09-22 LAB — COMPREHENSIVE METABOLIC PANEL
ALT: 32 U/L (ref 0–44)
AST: 28 U/L (ref 15–41)
Albumin: 3.8 g/dL (ref 3.5–5.0)
Alkaline Phosphatase: 84 U/L (ref 38–126)
Anion gap: 8 (ref 5–15)
BUN: 18 mg/dL (ref 8–23)
CO2: 25 mmol/L (ref 22–32)
Calcium: 8.9 mg/dL (ref 8.9–10.3)
Chloride: 101 mmol/L (ref 98–111)
Creatinine, Ser: 1.11 mg/dL (ref 0.61–1.24)
GFR, Estimated: >60 mL/min (ref >60)
Glucose, Bld: 167 mg/dL — ABNORMAL HIGH (ref 70–99)
Potassium: 4.3 mmol/L (ref 3.5–5.1)
Sodium: 134 mmol/L — ABNORMAL LOW (ref 135–145)
Total Bilirubin: 1.2 mg/dL (ref 0.3–1.2)
Total Protein: 6.9 g/dL (ref 6.5–8.1)

## 2020-09-22 LAB — TROPONIN I (HIGH SENSITIVITY): Troponin I (High Sensitivity): 35 ng/L — ABNORMAL HIGH (ref <18)

## 2020-09-22 LAB — BRAIN NATRIURETIC PEPTIDE: B Natriuretic Peptide: 2008.2 pg/mL — ABNORMAL HIGH (ref 0.0–100.0)

## 2020-09-22 LAB — RESP PANEL BY RT-PCR (FLU A&B, COVID) ARPGX2
Influenza A by PCR: NEGATIVE
Influenza B by PCR: NEGATIVE
SARS Coronavirus 2 by RT PCR: NEGATIVE

## 2020-09-22 NOTE — ED Triage Notes (Signed)
Pt reports that he has been coughing x 1 month. Is suppose to have a heart cath in the AM. Reports SOB. Denies fever.

## 2020-09-22 NOTE — H&P (Signed)
Chief complaint: Dyspnea.  HPI: 75 year old gentleman with history of coronary artery disease status post CABG has been having shortness of breath and fatigue for about 4 weeks.  He has had progressive dyspnea on exertion to the point it has progressed to class III dyspnea.  Denies any chest pain.  Has been having orthopnea and difficulty sleeping at night.  He had an echocardiogram which showed an ejection fraction of 40 to 45% and a recent stress test that showed a fixed defect with no evidence of ischemia.  Given his progressive symptoms, he decided to call Dr. Gwenlyn Found who advised him to come to the ER.  The patient has shortness of breath but denies any chest pain.    Review of Systems:     Cardiac Review of Systems: {Y] = yes [ ]  = no  Chest Pain [    ]  Resting SOB Blue.Reese   ] Exertional SOB  [ y ]  Orthopnea [ y ]   Pedal Edema [   ]    Palpitations [  ] Syncope  [  ]   Presyncope [   ]  General Review of Systems: [Y] = yes [  ]=no Constitional: recent weight change [  ]; anorexia [  ]; fatigue [  ]; nausea [  ]; night sweats [  ]; fever [  ]; or chills [  ];                                                                     Dental: poor dentition[  ];   Eye : blurred vision [  ]; diplopia [   ]; vision changes [  ];  Amaurosis fugax[  ]; Resp: cough [ y ];  wheezing[  ];  hemoptysis[  ]; shortness of breath[  y]; paroxysmal nocturnal dyspnea[  ]; dyspnea on exertion[ y ]; or orthopnea[ y ];  GI:  gallstones[  ], vomiting[  ];  dysphagia[  ]; melena[  ];  hematochezia [  ]; heartburn[  ];   GU: kidney stones [  ]; hematuria[  ];   dysuria [  ];  nocturia[  ];               Skin: rash [  ], swelling[ y ];, hair loss[  ];  peripheral edema[  ];  or itching[  ]; Musculosketetal: myalgias[  ];  joint swelling[  y];  joint erythema[  ];  joint pain[  ];  back pain[  ];  Heme/Lymph: bruising[  ];  bleeding[  ];  anemia[  ];  Neuro: TIA[  ];  headaches[  ];  stroke[  ];  vertigo[  ];  seizures[   ];   paresthesias[  ];  difficulty walking[  ];  Psych:depression[  ]; anxiety[  ]y;  Endocrine: diabetes[  ];  thyroid dysfunction[  ];  Other:  Past Medical History:  Diagnosis Date  . Cancer ([place])   . Carotid artery disease ([place])   . GERD (gastroesophageal reflux disease)   . Hyperlipemia   . Hypertension    Dr Gwenlyn Found  . Myocardial infarct ([place])    CABG-1995- LIMA-D1-LAD, SVG-OM, SVG-RCA   . Ventricular tachycardia, sustained ([place]) 04/27/2015  No current facility-administered medications on file prior to encounter.   Current Outpatient Medications on File Prior to Encounter  Medication Sig Dispense Refill  . acetaminophen (TYLENOL) 325 MG tablet Take 650 mg by mouth every 6 (six) hours as needed for mild pain, moderate pain, fever or headache.    [name] amLODipine (NORVASC) 5 MG tablet TAKE 1 TABLET(5 MG) BY MOUTH DAILY 30 tablet 11  . aspirin EC 81 MG EC tablet Take 1 tablet (81 mg total) by mouth daily with breakfast.    . atorvastatin (LIPITOR) 40 MG tablet Take 1 tablet (40 mg total) by mouth at bedtime. Or at supper 30 tablet 6  . Carboxymethylcellulose Sodium 0.25 % SOLN INSTILL 1 DROP IN BOTH EYES TWICE A DAY    . carvedilol (COREG) 6.25 MG tablet Take 1 tablet (6.25 mg total) by mouth 2 (two) times daily with a meal. 60 tablet 6  . Cholecalciferol 2000 UNITS TABS Take 2,000 Units by mouth daily.    . clopidogrel (PLAVIX) 75 MG tablet TAKE 1 TABLET(75 MG) BY MOUTH DAILY 90 tablet 0  . ezetimibe (ZETIA) 10 MG tablet TAKE 1 TABLET(10 MG) BY MOUTH DAILY 90 tablet 0  . lisinopril (PRINIVIL,ZESTRIL) 20 MG tablet Take 1 tablet (20 mg total) by mouth every morning. 30 tablet 6  . meloxicam (MOBIC) 7.5 MG tablet Take 1 tablet by mouth 2 (two) times daily as needed.    . methocarbamol (ROBAXIN) 500 MG tablet Take 1 tablet (500 mg total) by mouth every 6 (six) hours as needed for muscle spasms. 90 tablet 1  . pantoprazole (PROTONIX) 40 MG tablet Take 40 mg by mouth 2 (two) times daily.     . tamsulosin (FLOMAX) 0.4 MG CAPS capsule Take 0.4 mg by mouth daily after breakfast.    . traMADol (ULTRAM) 50 MG tablet Take 50 mg by mouth every 6 (six) hours as needed for moderate pain or severe pain.       Allergies  Allergen Reactions  . Clarithromycin Swelling    Social History   Socioeconomic History  . Marital status: Married    Spouse name: Not on file  . Number of children: Not on file  . Years of education: Not on file  . Highest education level: Not on file  Occupational History  . 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 Smoking status: Former Smoker    Quit date: 04/06/1983    Years since quitting: 37.4  . Smokeless tobacco: Never Used  Vaping Use  . Vaping Use: Never used  Substance and Sexual Activity  . Alcohol use: No    Alcohol/week: 0.0 standard drinks  . Drug use: No  . Sexual activity: Not on file  Other Topics Concern  . Not on file  Social History Narrative  . Not on file   Social Determinants of Health   Financial Resource Strain: Not on file  Food Insecurity: Not on file  Transportation Needs: Not on file  Physical Activity: Not on file  Stress: Not on file  Social Connections: Not on file  Intimate Partner Violence: Not on file    Family History  Problem Relation Age of Onset  . Heart failure Mother   . Hypertension Mother   . Hypertension Other     PHYSICAL EXAM: Vitals:   09/22/20 2106  BP: (!) 168/101  Pulse: 67  Resp: 16  Temp: 98.3 F (36.8 C)  SpO2: 98%   General:  Well appearing. No respiratory  distress HEENT: normal Neck: supple.  As JVD. Carotids 2+ bilat; no bruits. No lymphadenopathy or thryomegaly appreciated. Cor: PMI nondisplaced. Regular rate & rhythm. No rubs, gallops or murmurs. Lungs: Bilateral fine crackles Abdomen: soft, nontender, nondistended. No hepatosplenomegaly. No bruits or masses. Good bowel sounds. Extremities: no cyanosis, clubbing, rash, 1+ edema Neuro: alert & oriented x  3, cranial nerves grossly intact. moves all 4 extremities w/o difficulty. Affect pleasant.  ECG: Sinus rhythm with first-degree AV block and old inferior wall MI.  Recent Results (from the past 43800 hour(s))  ECHOCARDIOGRAM COMPLETE   Collection Time: 09/13/20 11:19 AM  Result Value   S' Lateral 5.30   P 1/2 time 615   Narrative      ECHOCARDIOGRAM REPORT       Patient Name:   Victor Estes Date of Exam: 09/13/2020 Medical Rec #:  [id number]           Height:       68.0 in Accession #:    [id number]          Weight:       194.0 lb Date of Birth:  03-07-1946            BSA:          2.017 m Patient Age:    61 years            BP:           122/78 mmHg Patient Gender: M                   HR:           72 bpm. Exam Location:  Outpatient  Procedure: 2D Echo, Cardiac Doppler, Color Doppler and Intracardiac            Opacification Agent  Indications:    R06.00 Shortness of breath   History:        Patient has prior history of Echocardiogram examinations, most                 recent 04/30/2015. Ischemic cardiomyopathy, CAD and Previous                 Myocardial Infarction, Prior CABG, Arrythmias:VT,                 Signs/Symptoms:Shortness of Breath; Risk Factors:Hypertension.                 Nuc Test performed 09/07/20 Defect 1: There is a large defect of                 severe severity present in the basal inferoseptal, basal                 inferior, mid inferoseptal, mid inferior and apical inferior                 location.                 Findings consistent with prior myocardial infarction                 Previous echo revealed LVEF 45% with mild AR.   Sonographer:    Lenard Galloway BA, RDCS Referring Phys: [place]    1. Left ventricular ejection fraction, by estimation, is 40 to 45%. The left ventricle has mildly decreased function. The left ventricle demonstrates regional wall motion abnormalities (see scoring diagram/findings for  description). The left ventricular  internal cavity size was moderately dilated. Left ventricular diastolic function could not be evaluated. There is severe akinesis of the left ventricular, basal-mid inferior wall and inferolateral wall.  2. Right ventricular systolic function is low normal. The right ventricular size is mildly enlarged. There is normal pulmonary artery systolic pressure.  3. Left atrial size was moderately dilated.  4. Right atrial size was moderately dilated.  5. The mitral valve is abnormal. Mild mitral valve regurgitation.  6. The aortic valve is tricuspid. Aortic valve regurgitation trivial to mild. Mild aortic valve sclerosis is present, with no evidence of aortic valve stenosis.  7. Mildly dilated pulmonary artery.  8. The inferior vena cava is dilated in size with >50% respiratory variability, suggesting right atrial pressure of 8 mmHg.  Comparison(s): No significant change from prior study. 04/30/2015: LVEF 40-45%, mild AR.  FINDINGS  Left Ventricle: Left ventricular ejection fraction, by estimation, is 40 to 45%. The left ventricle has mildly decreased function. The left ventricle demonstrates regional wall motion abnormalities. Severe akinesis of the left ventricular, basal-mid  inferior wall and inferolateral wall. Definity contrast agent was given IV to delineate the left ventricular endocardial borders. The left ventricular internal cavity size was moderately dilated. There is no left ventricular hypertrophy. Left ventricular  diastolic function could not be evaluated due to indeterminate diastolic function. Left ventricular diastolic function could not be evaluated.  Right Ventricle: The right ventricular size is mildly enlarged. No increase in right ventricular wall thickness. Right ventricular systolic function is low normal. There is normal pulmonary artery systolic pressure. The tricuspid regurgitant velocity is  2.52 m/s, and with an assumed right atrial  pressure of 8 mmHg, the estimated right ventricular systolic pressure is 54.6 mmHg.  Left Atrium: Left atrial size was moderately dilated.  Right Atrium: Right atrial size was moderately dilated.  Pericardium: There is no evidence of pericardial effusion.  Mitral Valve: The mitral valve is abnormal. There is mild thickening of the mitral valve leaflet(s). Mild mitral valve regurgitation.  Tricuspid Valve: The tricuspid valve is grossly normal. Tricuspid valve regurgitation is mild.  Aortic Valve: The aortic valve is tricuspid. Aortic valve regurgitation trivial to mild. Aortic regurgitation PHT measures 615 msec. Mild aortic valve sclerosis is present, with no evidence of aortic valve stenosis.  Pulmonic Valve: The pulmonic valve was grossly normal. Pulmonic valve regurgitation is trivial.  Aorta: The aortic root and ascending aorta are structurally normal, with no evidence of dilitation.  Pulmonary Artery: The pulmonary artery is mildly dilated.  Venous: The inferior vena cava is dilated in size with greater than 50% respiratory variability, suggesting right atrial pressure of 8 mmHg.  IAS/Shunts: No atrial level shunt detected by color flow Doppler.  Additional Comments: A device lead is visualized.    LEFT VENTRICLE PLAX 2D LVIDd:         6.70 cm LVIDs:         5.30 cm LV PW:         1.20 cm LV IVS:        0.70 cm LVOT diam:     2.30 cm LV SV:         53 LV SV Index:   26 LVOT Area:     4.15 cm    RIGHT VENTRICLE            IVC RV Basal diam:  5.25 cm    IVC diam: 2.30 cm RV Mid diam:    3.60 cm RVSP:  33.4 mmHg  LEFT ATRIUM              Index       RIGHT ATRIUM           Index LA diam:        5.00 cm  2.48 cm/m  RA Pressure: 8.00 mmHg LA Vol (A2C):   67.6 ml  33.51 ml/m RA Area:     24.50 cm LA Vol (A4C):   103.0 ml 51.05 ml/m RA Volume:   84.80 ml  42.03 ml/m LA Biplane Vol: 84.2 ml  41.74 ml/m  AORTIC VALVE LVOT Vmax:   62.80 cm/s LVOT Vmean:   44.725 cm/s LVOT VTI:    0.126 m AI PHT:      615 msec   AORTA Ao Root diam: 2.90 cm Ao Asc diam:  3.50 cm  TRICUSPID VALVE TR Peak grad:   25.4 mmHg TR Vmax:        252.00 cm/s Estimated RAP:  8.00 mmHg RVSP:           33.4 mmHg   SHUNTS Systemic VTI:  0.13 m Systemic Diam: 2.30 cm  Lyman Bishop MD Electronically signed by Lyman Bishop MD Signature Date/Time: 09/13/2020/3:06:55 PM       Final     *Note: Due to a large number of results and/or encounters for the requested time period, some results have not been displayed. A complete set of results can be found in Results Review.    Cath  The LIMA sequenced to the first diagonal and mid LAD. The graft is widely patent with no obstruction.  SVG .  The saphenous vein graft to first OM is widely patent. The OM target is a large caliber vessel that is totally occluded before the graft insertion, but the native vessel is widely patent beyond the graft insertion site.  Ost 1st Mrg to 1st Mrg lesion, 100% stenosed.  SVG .  The saphenous vein graft, presumably to distal RCA, 100% occluded at the aortic anastomosis  Origin lesion, 100% stenosed.  LM lesion, 80% stenosed.  Ost Cx lesion, 90% stenosed.  Prox LAD lesion, 90% stenosed.  Ost RCA lesion, 95% stenosed.  Prox RCA lesion, 90% stenosed.  Mid RCA lesion, 80% stenosed.  There is moderate to severe left ventricular systolic dysfunction.   1. Severe native multivessel coronary artery disease with severe left main stenosis, proximal LAD stenosis, proximal left circumflex stenosis, and severe diffuse RCA stenosis  2. Status post remote aortocoronary bypass surgery with continued patency of the sequential LIMA graft to the first diagonal and LAD, and continued patency of the saphenous vein graft obtuse marginal.  3. Likely chronic occlusion of the saphenous vein graft RCA  4. Moderately severe LV segmental dysfunction consistent with this patient's known  history of old inferior wall MI   Results for orders placed or performed during the hospital encounter of 09/22/20 (from the past 24 hour(s))  CBC with Differential     Status: Abnormal   Collection Time: 09/22/20  8:57 PM  Result Value Ref Range   WBC 12.2 (H) 4.0 - 10.5 K/uL   RBC 4.69 4.22 - 5.81 MIL/uL   Hemoglobin 13.7 13.0 - 17.0 g/dL   HCT 42.6 39.0 - 52.0 %   MCV 90.8 80.0 - 100.0 fL   MCH 29.2 26.0 - 34.0 pg   MCHC 32.2 30.0 - 36.0 g/dL   RDW 14.5 11.5 - 15.5 %   Platelets 301 150 - 400 K/uL  nRBC 0.0 0.0 - 0.2 %   Neutrophils Relative % 82 %   Neutro Abs 10.0 (H) 1.7 - 7.7 K/uL   Lymphocytes Relative 10 %   Lymphs Abs 1.2 0.7 - 4.0 K/uL   Monocytes Relative 7 %   Monocytes Absolute 0.8 0.1 - 1.0 K/uL   Eosinophils Relative 0 %   Eosinophils Absolute 0.0 0.0 - 0.5 K/uL   Basophils Relative 0 %   Basophils Absolute 0.1 0.0 - 0.1 K/uL   Immature Granulocytes 1 %   Abs Immature Granulocytes 0.06 0.00 - 0.07 K/uL  Resp Panel by RT-PCR (Flu A&B, Covid) Nasopharyngeal Swab     Status: None   Collection Time: 09/22/20 10:07 PM   Specimen: Nasopharyngeal Swab; Nasopharyngeal(NP) swabs in vial transport medium  Result Value Ref Range   SARS Coronavirus 2 by RT PCR NEGATIVE NEGATIVE   Influenza A by PCR NEGATIVE NEGATIVE   Influenza B by PCR NEGATIVE NEGATIVE    ASSESSMENT and PLAN/DISCUSSION: Congestive heart failure, ACC-C, NYHA class III: Patient has clinical signs and symptoms of congestive heart failure with acute exacerbation.  Is already on ACE inhibitors and beta-blockers.  IV Lasix for diuresis for symptomatic relief.  Since the patient is on lisinopril, Entresto can only be started after stopping the lisinopril for at least 36 hours.  Start the patient on empagliflozin as well.  Coronary artery disease status post CABG: I do not feel this presentation is related to an acute coronary syndrome.  Will defer further management decisions to Dr. Gwenlyn Found.  Essential  hypertension: Not well controlled.  We will treat the CHF which will help his blood pressure as well.  Patient needs to be on a strict low-salt diet.  Findings and plan explained to the patient and he agrees.

## 2020-09-22 NOTE — ED Provider Notes (Signed)
I received a call from Dr. Quay Burow regarding this patient.  Patient has dyspnea on exertion which is worsening.  Dr. Trisha Mangle he will see and arrange for cardiac catheterization tomorrow.  Dr. Gwenlyn Found has contacted the on-call cardiology fellow for assistance with admission and treatment.   Daleen Bo, MD 09/22/20 2056

## 2020-09-22 NOTE — ED Provider Notes (Signed)
[facility] EMERGENCY DEPARTMENT Provider Note   CSN: [id number] Arrival date & time: 09/22/20  2039     History Chief Complaint  Patient presents with  . Shortness of Breath  . Cough    Victor Estes is a 75 y.o. male.  The history is provided by the patient.  Shortness of Breath Severity:  Moderate Onset quality:  Gradual Duration:  1 month Timing:  Intermittent Progression:  Worsening Chronicity:  New Relieved by:  Rest Worsened by:  Exertion Associated symptoms: cough   Associated symptoms: no chest pain   Cough Associated symptoms: shortness of breath   Associated symptoms: no chest pain    Patient history of CAD, hypertension presents with increasing shortness of breath for the past month.  He reports has been worse over the past week with very minimal exertion.  Improves with rest.  No chest pain.  He has had dizziness at times, but no syncope.    Past Medical History:  Diagnosis Date  . Cancer ([place])   . Carotid artery disease ([place])   . GERD (gastroesophageal reflux disease)   . Hyperlipemia   . Hypertension    Dr Gwenlyn Found  . Myocardial infarct ([place])    CABG-1995- LIMA-D1-LAD, SVG-OM, SVG-RCA   . Ventricular tachycardia, sustained ([place]) 04/27/2015    Patient Active Problem List   Diagnosis Date Noted  . CHF (congestive heart failure) ([place]) 09/22/2020  . NSTEMI (non-ST elevated myocardial infarction) ([place])- 03/2015 2nd VT, no culprit vessel at cath 05/10/2015  . Ischemic cardiomyopathy 05/10/2015  . Ventricular tachyarrhythmia ([place]) 04/29/2015  . Sinus bradycardia   . AV block, 1st degree   . Ventricular tachycardia, sustained ([place]) 04/27/2015  . Ventricular tachycardia ([place]) 04/27/2015  . DJD (degenerative joint disease) of knee 07/26/2013  . Coronary artery disease 04/05/2013  . Essential hypertension 04/05/2013  . Hyperlipidemia LDL goal <70 04/05/2013  . Carotid artery disease ([place]) 04/05/2013    Past Surgical History:   Procedure Laterality Date  . BACK SURGERY    . CARDIAC CATHETERIZATION N/A 04/27/2015   Severe native dz, LIMA-LAD & SVG-OM OK, SVG-RCA chronically occluded. Left Heart Cath and Coronary Angiography;  Surgeon: Sherren Mocha, MD;  Location: [place] CV LAB;  Service: Cardiovascular;  Laterality: N/A;  . CHOLECYSTECTOMY    . COLON SURGERY    . CORONARY ARTERY BYPASS GRAFT  1995    LIMA-D1-LAD, SVG-OM, SVG-RCA, Dr Redmond Pulling  . ELBOW SURGERY    . EP IMPLANTABLE DEVICE N/A 04/29/2015   Procedure: ICD Implant;  Surgeon: Deboraha Sprang, MD;  Location: [place] CV LAB;  Service: Cardiovascular;  Laterality: N/A; St Jude ICD, serial number [id number].  [name] LUMBAR LAMINECTOMY/DECOMPRESSION MICRODISCECTOMY  09/09/2011   Procedure: LUMBAR LAMINECTOMY/DECOMPRESSION MICRODISCECTOMY 1 LEVEL;  Surgeon: Eustace Moore, MD;  Location: [place] NEURO ORS;  Service: Neurosurgery;  Laterality: Right;  Right Lumbar Five-Sacral One Hemilaminectomy   . TOTAL KNEE ARTHROPLASTY Left 07/26/2013   Procedure: TOTAL KNEE ARTHROPLASTY;  Surgeon: Ninetta Lights, MD;  Location: [place];  Service: Orthopedics;  Laterality: Left;       Family History  Problem Relation Age of Onset  . Heart failure Mother   . Hypertension Mother   . Hypertension Other     Social History   Tobacco Use  . Smoking status: Former Smoker    Quit date: 04/06/1983    Years since quitting: 37.4  . Smokeless tobacco: Never Used  Vaping Use  . Vaping Use: Never used  Substance Use Topics  . Alcohol use: No    Alcohol/week: 0.0 standard drinks  . Drug use: No    Home Medications Prior to Admission medications   Medication Sig Start Date End Date Taking? Authorizing Provider  acetaminophen (TYLENOL) 325 MG tablet Take 650 mg by mouth every 6 (six) hours as needed for mild pain, moderate pain, fever or headache.    [provider]  amLODipine (NORVASC) 5 MG tablet TAKE 1 TABLET(5 MG) BY MOUTH DAILY 09/27/15   Lorretta Harp, MD   aspirin EC 81 MG EC tablet Take 1 tablet (81 mg total) by mouth daily with breakfast. 04/30/15   Strader, Fransisco Hertz, PA-C  atorvastatin (LIPITOR) 40 MG tablet Take 1 tablet (40 mg total) by mouth at bedtime. Or at supper 05/10/15   Barrett, Evelene Croon, PA-C  Carboxymethylcellulose Sodium 0.25 % SOLN INSTILL 1 DROP IN BOTH EYES TWICE A DAY 12/25/19   [provider]  carvedilol (COREG) 6.25 MG tablet Take 1 tablet (6.25 mg total) by mouth 2 (two) times daily with a meal. 04/30/15   Strader, Fransisco Hertz, PA-C  Cholecalciferol 2000 UNITS TABS Take 2,000 Units by mouth daily.    [provider]  clopidogrel (PLAVIX) 75 MG tablet TAKE 1 TABLET(75 MG) BY MOUTH DAILY 06/10/17   Lorretta Harp, MD  ezetimibe (ZETIA) 10 MG tablet TAKE 1 TABLET(10 MG) BY MOUTH DAILY 06/24/20   Lorretta Harp, MD  lisinopril (PRINIVIL,ZESTRIL) 20 MG tablet Take 1 tablet (20 mg total) by mouth every morning. 04/30/15   Strader, Fransisco Hertz, PA-C  meloxicam (MOBIC) 7.5 MG tablet Take 1 tablet by mouth 2 (two) times daily as needed. 01/16/20   [provider]  methocarbamol (ROBAXIN) 500 MG tablet Take 1 tablet (500 mg total) by mouth every 6 (six) hours as needed for muscle spasms. 07/26/13   Aundra Dubin, PA-C  pantoprazole (PROTONIX) 40 MG tablet Take 40 mg by mouth 2 (two) times daily.    [provider]  tamsulosin (FLOMAX) 0.4 MG CAPS capsule Take 0.4 mg by mouth daily after breakfast.    [provider]  traMADol (ULTRAM) 50 MG tablet Take 50 mg by mouth every 6 (six) hours as needed for moderate pain or severe pain.    [provider]    Allergies    Clarithromycin  Review of Systems   Review of Systems  Constitutional: Negative for unexpected weight change.  Respiratory: Positive for cough and shortness of breath.   Cardiovascular: Negative for chest pain.  Neurological: Positive for dizziness. Negative for syncope.  All other systems reviewed and are  negative.   Physical Exam Updated Vital Signs BP (!) 154/88   Pulse 71   Temp 98.3 F (36.8 C) (Oral)   Resp (!) 23   Ht 1.727 m (5\' 8" )   Wt 86.2 kg   SpO2 94%   BMI 28.89 kg/m   Physical Exam CONSTITUTIONAL: Elderly, no acute distress HEAD: Normocephalic/atraumatic EYES: EOMI/PERRL ENMT: Mucous membranes moist NECK: supple no meningeal signs SPINE/BACK:entire spine nontender CV: S1/S2 noted, no murmurs/rubs/gallops noted LUNGS: Coarse breath sounds bilaterally, mild tachycardia ABDOMEN: soft, nontender, no rebound or guarding, bowel sounds noted throughout abdomen GU:no cva tenderness NEURO: Pt is awake/alert/appropriate, moves all extremitiesx4.  No facial droop.   EXTREMITIES: pulses normal/equal, full ROM, edema noted to the lower extremities SKIN: warm, color normal PSYCH: no abnormalities of mood noted, alert and oriented to situation  ED Results / Procedures / Treatments  Labs (all labs ordered are listed, but only abnormal results are displayed) Labs Reviewed  COMPREHENSIVE METABOLIC PANEL - Abnormal; Notable for the following components:      Result Value   Sodium 134 (*)    Glucose, Bld 167 (*)    All other components within normal limits  CBC WITH DIFFERENTIAL/PLATELET - Abnormal; Notable for the following components:   WBC 12.2 (*)    Neutro Abs 10.0 (*)    All other components within normal limits  BRAIN NATRIURETIC PEPTIDE - Abnormal; Notable for the following components:   B Natriuretic Peptide 2,008.2 (*)    All other components within normal limits  TROPONIN I (HIGH SENSITIVITY) - Abnormal; Notable for the following components:   Troponin I (High Sensitivity) 35 (*)    All other components within normal limits  RESP PANEL BY RT-PCR (FLU A&B, COVID) ARPGX2  TROPONIN I (HIGH SENSITIVITY)  TROPONIN I (HIGH SENSITIVITY)    EKG EKG Interpretation  Date/Time:  Sunday September 22 2020 21:56:54 EDT Ventricular Rate:  67 PR Interval:  256 QRS  Duration: 108 QT Interval:  428 QTC Calculation: 452 R Axis:   37 Text Interpretation: Sinus rhythm with 1st degree A-V block Possible Inferior infarct , age undetermined T wave abnormality, consider anterolateral ischemia Abnormal ECG t wave inversions more prominant than prior  11/16 Confirmed by Aletta Edouard [phone]) on 09/22/2020 10:43:07 PM   Radiology DG Chest 2 View  Result Date: 09/22/2020 CLINICAL DATA:  Dyspnea EXAM: CHEST - 2 VIEW COMPARISON:  04/30/2015 FINDINGS: The heart size and mediastinal contours are within normal limits. Both lungs are clear. The visualized skeletal structures are unremarkable. Unchanged left chest wall AICD and remote median sternotomy. IMPRESSION: No active cardiopulmonary disease. Electronically Signed   By: Ulyses Jarred M.D.   On: 09/22/2020 21:46    Procedures Procedures   Medications Ordered in ED Medications - No data to display  ED Course  I have reviewed the triage vital signs and the nursing notes.  Pertinent labs & imaging results that were available during my care of the patient were reviewed by me and considered in my medical decision making (see chart for details).    MDM Rules/Calculators/A&P                          Patient presents with increasing shortness of breath with exertion. Cardiology was expecting patient, plans to admit and potential cardiac cath tomorrow morning.  Patient is currently hemodynamically appropriate and denies chest pain  Final Clinical Impression(s) / ED Diagnoses Final diagnoses:  Unstable angina ([address])  Dyspnea on exertion    Rx / DC Orders ED Discharge Orders    None       Ripley Fraise, MD 09/22/20 2346

## 2020-09-23 ENCOUNTER — Observation Stay (HOSPITAL_COMMUNITY): Payer: Medicare Other

## 2020-09-23 ENCOUNTER — Ambulatory Visit (INDEPENDENT_AMBULATORY_CARE_PROVIDER_SITE_OTHER): Payer: Medicare Other

## 2020-09-23 DIAGNOSIS — I10 Essential (primary) hypertension: Secondary | ICD-10-CM | POA: Diagnosis not present

## 2020-09-23 DIAGNOSIS — I5041 Acute combined systolic (congestive) and diastolic (congestive) heart failure: Secondary | ICD-10-CM | POA: Diagnosis not present

## 2020-09-23 DIAGNOSIS — I5023 Acute on chronic systolic (congestive) heart failure: Secondary | ICD-10-CM | POA: Diagnosis not present

## 2020-09-23 DIAGNOSIS — I472 Ventricular tachycardia, unspecified: Secondary | ICD-10-CM

## 2020-09-23 LAB — ECHOCARDIOGRAM COMPLETE
AR max vel: 1.36 cm2
AV Area VTI: 1.38 cm2
AV Area mean vel: 1.35 cm2
AV Mean grad: 7 mmHg
AV Peak grad: 12 mmHg
Ao pk vel: 1.73 m/s
Area-P 1/2: 4.23 cm2
Height: 68 in
P 1/2 time: 653 msec
S' Lateral: 5.2 cm
Weight: 3040 oz

## 2020-09-23 LAB — CBC
HCT: 39.6 % (ref 39.0–52.0)
Hemoglobin: 13.3 g/dL (ref 13.0–17.0)
MCH: 29.6 pg (ref 26.0–34.0)
MCHC: 33.6 g/dL (ref 30.0–36.0)
MCV: 88.2 fL (ref 80.0–100.0)
Platelets: 271 10*3/uL (ref 150–400)
RBC: 4.49 MIL/uL (ref 4.22–5.81)
RDW: 14.4 % (ref 11.5–15.5)
WBC: 11.1 10*3/uL — ABNORMAL HIGH (ref 4.0–10.5)
nRBC: 0 % (ref 0.0–0.2)

## 2020-09-23 LAB — BASIC METABOLIC PANEL
Anion gap: 8 (ref 5–15)
BUN: 17 mg/dL (ref 8–23)
CO2: 22 mmol/L (ref 22–32)
Calcium: 8.8 mg/dL — ABNORMAL LOW (ref 8.9–10.3)
Chloride: 105 mmol/L (ref 98–111)
Creatinine, Ser: 0.98 mg/dL (ref 0.61–1.24)
GFR, Estimated: >60 mL/min (ref >60)
Glucose, Bld: 125 mg/dL — ABNORMAL HIGH (ref 70–99)
Potassium: 3.6 mmol/L (ref 3.5–5.1)
Sodium: 135 mmol/L (ref 135–145)

## 2020-09-23 LAB — PROTIME-INR
INR: 1.2 (ref 0.8–1.2)
Prothrombin Time: 14.7 seconds (ref 11.4–15.2)

## 2020-09-23 LAB — TROPONIN I (HIGH SENSITIVITY)
Troponin I (High Sensitivity): 32 ng/L — ABNORMAL HIGH (ref <18)
Troponin I (High Sensitivity): 33 ng/L — ABNORMAL HIGH (ref <18)

## 2020-09-23 LAB — MAGNESIUM: Magnesium: 1.9 mg/dL (ref 1.7–2.4)

## 2020-09-23 MED ORDER — POTASSIUM CHLORIDE CRYS ER 20 MEQ PO TBCR
40.0000 meq | EXTENDED_RELEASE_TABLET | Freq: Once | ORAL | Status: AC
  Administered 2020-09-23: 40 meq via ORAL
  Filled 2020-09-23: qty 2

## 2020-09-23 MED ORDER — SODIUM CHLORIDE 0.9% FLUSH
3.0000 mL | Freq: Two times a day (BID) | INTRAVENOUS | Status: DC
Start: 2020-09-23 — End: 2020-09-24
  Administered 2020-09-23: 3 mL via INTRAVENOUS

## 2020-09-23 MED ORDER — ONDANSETRON HCL 4 MG/2ML IJ SOLN: 4.0000 mg | Freq: Four times a day (QID) | INTRAMUSCULAR | Status: DC | PRN

## 2020-09-23 MED ORDER — SODIUM CHLORIDE 0.9% FLUSH: 3.0000 mL | INTRAVENOUS | Status: DC | PRN

## 2020-09-23 MED ORDER — FUROSEMIDE 10 MG/ML IJ SOLN
40.0000 mg | Freq: Two times a day (BID) | INTRAMUSCULAR | Status: DC
  Administered 2020-09-23 – 2020-09-25 (×5): 40 mg via INTRAVENOUS
  Filled 2020-09-23 (×5): qty 4

## 2020-09-23 MED ORDER — SODIUM CHLORIDE 0.9 % IV SOLN: INTRAVENOUS | Status: DC

## 2020-09-23 MED ORDER — LOSARTAN POTASSIUM 50 MG PO TABS
100.0000 mg | ORAL_TABLET | Freq: Every day | ORAL | Status: DC
  Administered 2020-09-24 – 2020-09-25 (×2): 100 mg via ORAL
  Filled 2020-09-23 (×2): qty 2

## 2020-09-23 MED ORDER — VITAMIN D 25 MCG (1000 UNIT) PO TABS
2000.0000 [IU] | ORAL_TABLET | Freq: Every day | ORAL | Status: DC
  Administered 2020-09-23 – 2020-09-25 (×3): 2000 [IU] via ORAL
  Filled 2020-09-23 (×3): qty 2

## 2020-09-23 MED ORDER — ZOLPIDEM TARTRATE 5 MG PO TABS
5.0000 mg | ORAL_TABLET | Freq: Every evening | ORAL | Status: DC | PRN
  Administered 2020-09-23 – 2020-09-24 (×2): 5 mg via ORAL
  Filled 2020-09-23 (×2): qty 1

## 2020-09-23 MED ORDER — ATORVASTATIN CALCIUM 40 MG PO TABS
40.0000 mg | ORAL_TABLET | Freq: Every day | ORAL | Status: DC
  Administered 2020-09-23: 40 mg via ORAL
  Filled 2020-09-23: qty 1

## 2020-09-23 MED ORDER — SODIUM CHLORIDE 0.9 % IV SOLN: 250.0000 mL | INTRAVENOUS | Status: DC | PRN

## 2020-09-23 MED ORDER — ACETAMINOPHEN 325 MG PO TABS
650.0000 mg | ORAL_TABLET | ORAL | Status: DC | PRN
  Administered 2020-09-23 – 2020-09-25 (×4): 650 mg via ORAL
  Filled 2020-09-23 (×3): qty 2

## 2020-09-23 MED ORDER — FUROSEMIDE 20 MG PO TABS: 20.0000 mg | ORAL_TABLET | Freq: Every day | ORAL | Status: DC

## 2020-09-23 MED ORDER — EZETIMIBE 10 MG PO TABS
10.0000 mg | ORAL_TABLET | Freq: Every day | ORAL | Status: DC
  Administered 2020-09-23 – 2020-09-25 (×3): 10 mg via ORAL
  Filled 2020-09-23 (×3): qty 1

## 2020-09-23 MED ORDER — CARVEDILOL 6.25 MG PO TABS
6.2500 mg | ORAL_TABLET | Freq: Two times a day (BID) | ORAL | Status: DC
  Administered 2020-09-23 (×2): 6.25 mg via ORAL
  Filled 2020-09-23 (×2): qty 1
  Filled 2020-09-23: qty 2

## 2020-09-23 MED ORDER — SODIUM CHLORIDE 0.9% FLUSH
3.0000 mL | Freq: Two times a day (BID) | INTRAVENOUS | Status: DC
  Administered 2020-09-23: 3 mL via INTRAVENOUS

## 2020-09-23 MED ORDER — ENOXAPARIN SODIUM 40 MG/0.4ML ~~LOC~~ SOLN
40.0000 mg | Freq: Every day | SUBCUTANEOUS | Status: DC
  Administered 2020-09-23 – 2020-09-25 (×2): 40 mg via SUBCUTANEOUS
  Filled 2020-09-23 (×2): qty 0.4

## 2020-09-23 MED ORDER — ASPIRIN EC 81 MG PO TBEC
81.0000 mg | DELAYED_RELEASE_TABLET | Freq: Every day | ORAL | Status: DC
  Administered 2020-09-23 – 2020-09-25 (×3): 81 mg via ORAL
  Filled 2020-09-23 (×3): qty 1

## 2020-09-23 MED ORDER — AMLODIPINE BESYLATE 2.5 MG PO TABS
2.5000 mg | ORAL_TABLET | Freq: Every day | ORAL | Status: DC
  Administered 2020-09-23: 2.5 mg via ORAL
  Filled 2020-09-23 (×2): qty 1

## 2020-09-23 MED ORDER — EMPAGLIFLOZIN 10 MG PO TABS
10.0000 mg | ORAL_TABLET | Freq: Every day | ORAL | Status: DC
Start: 2020-09-23 — End: 2020-09-25
  Administered 2020-09-24 – 2020-09-25 (×2): 10 mg via ORAL
  Filled 2020-09-23 (×3): qty 1

## 2020-09-23 MED ORDER — LISINOPRIL 20 MG PO TABS
20.0000 mg | ORAL_TABLET | Freq: Every morning | ORAL | Status: DC
  Administered 2020-09-23: 20 mg via ORAL
  Filled 2020-09-23: qty 1

## 2020-09-23 MED ORDER — TRAMADOL HCL 50 MG PO TABS: 50.0000 mg | ORAL_TABLET | Freq: Four times a day (QID) | ORAL | Status: DC | PRN

## 2020-09-23 MED ORDER — EMPAGLIFLOZIN 25 MG PO TABS: 25.0000 mg | ORAL_TABLET | Freq: Every day | ORAL | Status: DC

## 2020-09-23 MED ORDER — TAMSULOSIN HCL 0.4 MG PO CAPS
0.4000 mg | ORAL_CAPSULE | Freq: Every day | ORAL | Status: DC
  Administered 2020-09-23 – 2020-09-25 (×3): 0.4 mg via ORAL
  Filled 2020-09-23 (×3): qty 1

## 2020-09-23 NOTE — Progress Notes (Signed)
  Echocardiogram 2D Echocardiogram has been performed.  Randa Lynn Jaymee Tilson 09/23/2020, 10:46 AM

## 2020-09-23 NOTE — TOC Benefit Eligibility Note (Signed)
Patient Teacher, English as a foreign language completed.    The patient is currently admitted and upon discharge could be taking Jardiance 10 mg.  The current 30 day co-pay is, $47.00.   The patient is currently admitted and upon discharge could be taking Entresto 24mg /26mg .  The current 30 day co-pay is, $47.00.   The patient is insured through [place] [place] Patient Advocate Specialist [place] Team Direct Number: [phone]  Fax: [phone]

## 2020-09-23 NOTE — ED Notes (Signed)
Pt is NSR w/ST depression and BBB on monitor

## 2020-09-23 NOTE — H&P (View-Only) (Signed)
Progress Note  Patient Name: Victor Estes Date of Encounter: 09/23/2020  Victor Estes Cardiologist: Quay Burow, MD   Subjective   Patient presented with progressive dyspnea overnight. He states he cannot stand up for long before getting very short of breath. He also describes orthopnea and states he has not been able to sleep for 5 days. He also has new lower extremity edema. No chest pain.  Inpatient Medications    Scheduled Meds:  amLODipine  2.5 mg Oral Daily   aspirin EC  81 mg Oral Daily   atorvastatin  40 mg Oral QHS   carvedilol  6.25 mg Oral BID WC   cholecalciferol  2,000 Units Oral Daily   empagliflozin  10 mg Oral Daily   enoxaparin (LOVENOX) injection  40 mg Subcutaneous Daily   ezetimibe  10 mg Oral Daily   furosemide  20 mg Oral Daily   lisinopril  20 mg Oral q morning   sodium chloride flush  3 mL Intravenous Q12H   tamsulosin  0.4 mg Oral QPC breakfast   Continuous Infusions:  sodium chloride     PRN Meds: sodium chloride, acetaminophen, ondansetron (ZOFRAN) IV, sodium chloride flush, traMADol   Vital Signs    Vitals:   09/23/20 0500 09/23/20 0515 09/23/20 0530 09/23/20 0637  BP: (!) 151/94 (!) 148/88 (!) 147/83 (!) 145/80  Pulse: 71 74 79 61  Resp: (!) 21 (!) 21 (!) 27 16  Temp:      TempSrc:      SpO2: 98% 94% 95% 99%  Weight:      Height:       No intake or output data in the 24 hours ending 09/23/20 0655 Last 3 Weights 09/22/2020 09/17/2020 11/30/2018  Weight (lbs) 190 lb 190 lb 194 lb  Weight (kg) 86.183 kg 86.183 kg 87.998 kg      Telemetry    Normal sinus rhythm with PVC/PACs. Ventricular couplet also noted as well as short runs of NSVT (longest being 4 beats). Baseline rates in the 60's to 70's with spikes to the 90's. - Personally Reviewed  ECG    EKG from 09/22/2020: Normal sinus rhythm, rate 67 bpm, with 1st degree AV block, inferior Q waves, and T wave inversion in V3-V6.  - Personally Reviewed  Physical Exam   GEN:  No acute distress.   Neck: Distended neck veins. Cardiac: RRR. No significant murmurs, rubs, or gallops.  Respiratory: No increased work of breathing. Faint crackles in bilateral bases. GI: Soft, non-distended, and non-tender. MS: 1+ pitting edema of bilateral lower extremities. No deformity. Skin: Warm and dry. Neuro:  No focal deficits. Psych: Normal affect. Responds appropriately.  Labs    High Sensitivity Troponin:   Recent Labs  Lab 09/22/20 2057 09/22/20 2342 09/23/20 0011  TROPONINIHS 35* 33* 32*      Chemistry Recent Labs  Lab 09/22/20 2057 09/23/20 0408  NA 134* 135  K 4.3 3.6  CL 101 105  CO2 25 22  GLUCOSE 167* 125*  BUN 18 17  CREATININE 1.11 0.98  CALCIUM 8.9 8.8*  PROT 6.9  --   ALBUMIN 3.8  --   AST 28  --   ALT 32  --   ALKPHOS 84  --   BILITOT 1.2  --   GFRNONAA >60 >60  ANIONGAP 8 8     Hematology Recent Labs  Lab 09/22/20 2057 09/23/20 0408  WBC 12.2* 11.1*  RBC 4.69 4.49  HGB 13.7 13.3  HCT 42.6  39.6  MCV 90.8 88.2  MCH 29.2 29.6  MCHC 32.2 33.6  RDW 14.5 14.4  PLT 301 271    BNP Recent Labs  Lab 09/22/20 2057  BNP 2,008.2*     DDimer No results for input(s): DDIMER in the last 168 hours.   Radiology    DG Chest 2 View  Result Date: 09/22/2020 CLINICAL DATA:  Dyspnea EXAM: CHEST - 2 VIEW COMPARISON:  04/30/2015 FINDINGS: The heart size and mediastinal contours are within normal limits. Both lungs are clear. The visualized skeletal structures are unremarkable. Unchanged left chest wall AICD and remote median sternotomy. IMPRESSION: No active cardiopulmonary disease. Electronically Signed   By: Ulyses Jarred M.D.   On: 09/22/2020 21:46    Cardiac Studies   Lexiscan Myoview 09/06/2020: There was no ST segment deviation noted during stress. Defect 1: There is a large defect of severe severity present in the basal inferoseptal, basal inferior, mid inferoseptal, mid inferior and apical inferior location. Findings consistent  with prior myocardial infarction.   Severe fixed defect in inferior/inferoseptal walls consistent with prior infarct. No ischemia noted. This is consistent with prior study. Wall motion unable to be assessed, study not gated due to frequent PVCs (15-20/min). _______________  Echocardiogram 09/13/2020: Impressions:  1. Left ventricular ejection fraction, by estimation, is 40 to 45%. The  left ventricle has mildly decreased function. The left ventricle  demonstrates regional wall motion abnormalities (see scoring  diagram/findings for description). The left ventricular   internal cavity size was moderately dilated. Left ventricular diastolic  function could not be evaluated. There is severe akinesis of the left  ventricular, basal-mid inferior wall and inferolateral wall.   2. Right ventricular systolic function is low normal. The right  ventricular size is mildly enlarged. There is normal pulmonary artery  systolic pressure.   3. Left atrial size was moderately dilated.   4. Right atrial size was moderately dilated.   5. The mitral valve is abnormal. Mild mitral valve regurgitation.   6. The aortic valve is tricuspid. Aortic valve regurgitation trivial to  mild. Mild aortic valve sclerosis is present, with no evidence of aortic  valve stenosis.   7. Mildly dilated pulmonary artery.   8. The inferior vena cava is dilated in size with >50% respiratory  variability, suggesting right atrial pressure of 8 mmHg.   Comparison(s): No significant change from prior study. 04/30/2015: LVEF 40-45%, mild AR.  Patient Profile     75 y.o. male with a history of CAD with remote MI in 1993 and subsequent CABG x4 in 1995 and recent low risk Myoview with no evidence of ischemia on 09/06/2020, VT in 03/2015 s/p ICD, carotid artery disease, hypertension, hyperlipidemia, GERD, and remote smoking history who was admitted with acute CHF after presenting with worsening dyspnea.  Assessment & Plan    Acute on  Chronic Combined CHF - Patient presented with progressive shortness of breath, orthopnea, and lower extremity edema. - BNP elevated at 2,008. - Chest x-ray showed no acute findings.  - Recent Echo showed LVEF of 40-45% with severe akinesis of basal-mid, inferior wall, and inferolateral wall, mild MR, mild AI. No significant changes from prior study in 04/2015. - Will stop home Lasix and start IV Lasix 40mg  twice daily. - Continue home Lisinopril 20mg  daily and Coreg 6.25mg  twice daily for now. Consider switching to [facility] but this would requiring ACEi washout. - Started on Jardiance. - Monitor daily weight, strict I/O's, and renal function. - Discussed with patient's primary Cardiologist's  Dr. Gwenlyn Found. Plan is to diuresis today and hopefully [facility] tomorrow if patient able to lay flat. The patient understands that risks include but are not limited to stroke (1 in 1000), death (1 in 33), kidney failure [usually temporary] (1 in 500), bleeding (1 in 200), allergic reaction [possibly serious] (1 in 200), and agrees to proceed.   CAD Demand Ischemia - History of remote CABG x4 in 1995. - EKG does show T wave inversions in V3-V6. - High-sensitivity troponin minimally elevated and flat at 35 >> 33 >> 32. Not consistent with ACS. Consistent with demand ischemia from CHF. - Recent Myoview showed evidence of prior infarct but no ischemia. - No angina.  - Continue aspirin, beta-blocker, high-intensity statin and Zetia. - Plan is for [facility] as above.  VT - History of VT in 2016. S/p ICD. - Telemetry shows frequent PVCs and short runs of NSVT but not sustained VT. - Potassium 3.6 today. Will give 40 mEq of K-Dur. - Will check Magnesium. - Continue Coreg as above. - Continue to monitor on telemetry. - ICD followed by Dr. Caryl Comes.  Hypertension - BP mildly elevated but has not received morning medications yet. - Continue home Amlodipine 2.5mg  daily, Lisinopril 20mg  daily, and Coreg 6.25mg  twice  daily. - Hopefully will also improve with diuresis. - Consider switching to [facility] but would have to let Lisinopril washout.  Hyperlipidemia - LDL 53 in 2019. - Continue Lipitor 40mg  daily and Zetia 10mg  daily. - Will recheck fasting lipid panel tomorrow morning.    For questions or updates, please contact [place] Please consult [email or web address] for contact info under        Signed, Darreld Mclean, PA-C  09/23/2020, 6:55 AM    Personally seen and examined. Agree with APP above with the following comments: Briefly 26 M with CAD s/p CABG in 1990s VT s/p ICD, and HFmrEF seen peri-2022 Covid-19 infection.  Called in with new SOB, orthopnea and LE edema Patient notes that he has not been able to lay flat comfortable for the last 2-3 weeks.   Exam notable for LE edema, holosystolic murmur, no diastolic murmur auscultated, crackles in the bases Labs notable for stable creatinine, K > 3.5;  Novel troponin < 100; BNP 2008 Would recommend IV diuresis; with this regimen, patient is diuresing well.   BB continued in the setting of short runs of NSVT Aggressive potassium replacement Planned for RHC/LHC 09/24/20. If no coronary intervention; will consider ACE washout and ARNI administration  Rudean Haskell, MD [place]  [address] [place], [place] [address] [phone]  12:01 PM

## 2020-09-23 NOTE — Progress Notes (Signed)
Heart Failure Stewardship Pharmacist Progress Note   PCP: Pcp, No PCP-Cardiologist: Quay Burow, MD    HPI:  75 yo M with PMH of CAD s/p CABG, VT s/p ICD, HTN, HLD, GERD, and remote smoking history. Presented to the ED on 09/22/20 with DOE and orthopnea. Last ECHO was done on 09/13/20 and LVEF was 40-45%. Repeat ECHO is pending. Planning for [facility] on 09/24/20.  Current HF Medications: Furosemide 40 mg IV BID Carvedilol 6.25 mg BID Lisinopril 20 mg daily Jardiance 10 mg daily  Prior to admission HF Medications: Carvedilol 6.25 mg BID Lisinopril 20 mg daily  Pertinent Lab Values: . Serum creatinine 0.98, BUN 17, Potassium 3.6, Sodium 135, BNP 2008.2, Magnesium 1.9   Vital Signs: . Weight: 186 lbs (admission weight: 186 lbs) . Blood pressure: 140-160/80s  . Heart rate: 50s   Medication Assistance / Insurance Benefits Check: Does the patient have prescription insurance?  Yes Type of insurance plan: [employer]  Does the patient qualify for medication assistance through manufacturers or grants?   Pending . Eligible grants and/or patient assistance programs: pending . Medication assistance applications in progress: none  . Medication assistance applications approved: none Approved medication assistance renewals will be completed by: pending   Outpatient Pharmacy:  Prior to admission outpatient pharmacy: [facility] Is the patient willing to use [facility] at discharge? Yes Is the patient willing to transition their outpatient pharmacy to utilize a [facility] outpatient pharmacy?   Pending    Assessment: 1. Acute on chronic systolic CHF (EF 71-69%), due to ICM. NYHA class III symptoms. - Continue furosemide 40 mg IV BID. Potassium replaced. - Continue carvedilol 6.25 mg BID - On lisinopril 20 mg daily - consider transitioning to losartan with eventual plan to start Entresto  - Continue Jardiance 10 mg daily - Consider starting spironolactone prior to  discharge   Plan: 1) Medication changes recommended at this time: - Change lisinopril to losartan 100 mg daily  2) Patient assistance: - Jardiance copay $47 per month - Entresto copay $47 per month - Appreciate assistance from Willette Alma, CPhT  3)  Education  - To be completed prior to discharge  Kerby Nora, PharmD, BCPS Heart Failure Stewardship Pharmacist Phone [phone]

## 2020-09-23 NOTE — Progress Notes (Addendum)
Progress Note  Patient Name: Victor Estes Date of Encounter: 09/23/2020  [facility] Cardiologist: Quay Burow, MD   Subjective   Patient presented with progressive dyspnea overnight. He states he cannot stand up for long before getting very short of breath. He also describes orthopnea and states he has not been able to sleep for 5 days. He also has new lower extremity edema. No chest pain.  Inpatient Medications    Scheduled Meds:  amLODipine  2.5 mg Oral Daily   aspirin EC  81 mg Oral Daily   atorvastatin  40 mg Oral QHS   carvedilol  6.25 mg Oral BID WC   cholecalciferol  2,000 Units Oral Daily   empagliflozin  10 mg Oral Daily   enoxaparin (LOVENOX) injection  40 mg Subcutaneous Daily   ezetimibe  10 mg Oral Daily   furosemide  20 mg Oral Daily   lisinopril  20 mg Oral q morning   sodium chloride flush  3 mL Intravenous Q12H   tamsulosin  0.4 mg Oral QPC breakfast   Continuous Infusions:  sodium chloride     PRN Meds: sodium chloride, acetaminophen, ondansetron (ZOFRAN) IV, sodium chloride flush, traMADol   Vital Signs    Vitals:   09/23/20 0500 09/23/20 0515 09/23/20 0530 09/23/20 0637  BP: (!) 151/94 (!) 148/88 (!) 147/83 (!) 145/80  Pulse: 71 74 79 61  Resp: (!) 21 (!) 21 (!) 27 16  Temp:      TempSrc:      SpO2: 98% 94% 95% 99%  Weight:      Height:       No intake or output data in the 24 hours ending 09/23/20 0655 Last 3 Weights 09/22/2020 09/17/2020 11/30/2018  Weight (lbs) 190 lb 190 lb 194 lb  Weight (kg) 86.183 kg 86.183 kg 87.998 kg      Telemetry    Normal sinus rhythm with PVC/PACs. Ventricular couplet also noted as well as short runs of NSVT (longest being 4 beats). Baseline rates in the 60's to 70's with spikes to the 90's. - Personally Reviewed  ECG    EKG from 09/22/2020: Normal sinus rhythm, rate 67 bpm, with 1st degree AV block, inferior Q waves, and T wave inversion in V3-V6.  - Personally Reviewed  Physical Exam   GEN:  No acute distress.   Neck: Distended neck veins. Cardiac: RRR. No significant murmurs, rubs, or gallops.  Respiratory: No increased work of breathing. Faint crackles in bilateral bases. GI: Soft, non-distended, and non-tender. MS: 1+ pitting edema of bilateral lower extremities. No deformity. Skin: Warm and dry. Neuro:  No focal deficits. Psych: Normal affect. Responds appropriately.  Labs    High Sensitivity Troponin:   Recent Labs  Lab 09/22/20 2057 09/22/20 2342 09/23/20 0011  TROPONINIHS 35* 33* 32*      Chemistry Recent Labs  Lab 09/22/20 2057 09/23/20 0408  NA 134* 135  K 4.3 3.6  CL 101 105  CO2 25 22  GLUCOSE 167* 125*  BUN 18 17  CREATININE 1.11 0.98  CALCIUM 8.9 8.8*  PROT 6.9  --   ALBUMIN 3.8  --   AST 28  --   ALT 32  --   ALKPHOS 84  --   BILITOT 1.2  --   GFRNONAA >60 >60  ANIONGAP 8 8     Hematology Recent Labs  Lab 09/22/20 2057 09/23/20 0408  WBC 12.2* 11.1*  RBC 4.69 4.49  HGB 13.7 13.3  HCT 42.6  39.6  MCV 90.8 88.2  MCH 29.2 29.6  MCHC 32.2 33.6  RDW 14.5 14.4  PLT 301 271    BNP Recent Labs  Lab 09/22/20 2057  BNP 2,008.2*     DDimer No results for input(s): DDIMER in the last 168 hours.   Radiology    DG Chest 2 View  Result Date: 09/22/2020 CLINICAL DATA:  Dyspnea EXAM: CHEST - 2 VIEW COMPARISON:  04/30/2015 FINDINGS: The heart size and mediastinal contours are within normal limits. Both lungs are clear. The visualized skeletal structures are unremarkable. Unchanged left chest wall AICD and remote median sternotomy. IMPRESSION: No active cardiopulmonary disease. Electronically Signed   By: Ulyses Jarred M.D.   On: 09/22/2020 21:46    Cardiac Studies   Lexiscan Myoview 09/06/2020: There was no ST segment deviation noted during stress. Defect 1: There is a large defect of severe severity present in the basal inferoseptal, basal inferior, mid inferoseptal, mid inferior and apical inferior location. Findings consistent  with prior myocardial infarction.   Severe fixed defect in inferior/inferoseptal walls consistent with prior infarct. No ischemia noted. This is consistent with prior study. Wall motion unable to be assessed, study not gated due to frequent PVCs (15-20/min). _______________  Echocardiogram 09/13/2020: Impressions:  1. Left ventricular ejection fraction, by estimation, is 40 to 45%. The  left ventricle has mildly decreased function. The left ventricle  demonstrates regional wall motion abnormalities (see scoring  diagram/findings for description). The left ventricular   internal cavity size was moderately dilated. Left ventricular diastolic  function could not be evaluated. There is severe akinesis of the left  ventricular, basal-mid inferior wall and inferolateral wall.   2. Right ventricular systolic function is low normal. The right  ventricular size is mildly enlarged. There is normal pulmonary artery  systolic pressure.   3. Left atrial size was moderately dilated.   4. Right atrial size was moderately dilated.   5. The mitral valve is abnormal. Mild mitral valve regurgitation.   6. The aortic valve is tricuspid. Aortic valve regurgitation trivial to  mild. Mild aortic valve sclerosis is present, with no evidence of aortic  valve stenosis.   7. Mildly dilated pulmonary artery.   8. The inferior vena cava is dilated in size with >50% respiratory  variability, suggesting right atrial pressure of 8 mmHg.   Comparison(s): No significant change from prior study. 04/30/2015: LVEF 40-45%, mild AR.  Patient Profile     75 y.o. male with a history of CAD with remote MI in 1993 and subsequent CABG x4 in 1995 and recent low risk Myoview with no evidence of ischemia on 09/06/2020, VT in 03/2015 s/p ICD, carotid artery disease, hypertension, hyperlipidemia, GERD, and remote smoking history who was admitted with acute CHF after presenting with worsening dyspnea.  Assessment & Plan    Acute on  Chronic Combined CHF - Patient presented with progressive shortness of breath, orthopnea, and lower extremity edema. - BNP elevated at 2,008. - Chest x-ray showed no acute findings.  - Recent Echo showed LVEF of 40-45% with severe akinesis of basal-mid, inferior wall, and inferolateral wall, mild MR, mild AI. No significant changes from prior study in 04/2015. - Will stop home Lasix and start IV Lasix 40mg  twice daily. - Continue home Lisinopril 20mg  daily and Coreg 6.25mg  twice daily for now. Consider switching to [facility] but this would requiring ACEi washout. - Started on Jardiance. - Monitor daily weight, strict I/O's, and renal function. - Discussed with patient's primary Cardiologist's  Dr. Gwenlyn Found. Plan is to diuresis today and hopefully [facility] tomorrow if patient able to lay flat. The patient understands that risks include but are not limited to stroke (1 in 1000), death (1 in 39), kidney failure [usually temporary] (1 in 500), bleeding (1 in 200), allergic reaction [possibly serious] (1 in 200), and agrees to proceed.   CAD Demand Ischemia - History of remote CABG x4 in 1995. - EKG does show T wave inversions in V3-V6. - High-sensitivity troponin minimally elevated and flat at 35 >> 33 >> 32. Not consistent with ACS. Consistent with demand ischemia from CHF. - Recent Myoview showed evidence of prior infarct but no ischemia. - No angina.  - Continue aspirin, beta-blocker, high-intensity statin and Zetia. - Plan is for [facility] as above.  VT - History of VT in 2016. S/p ICD. - Telemetry shows frequent PVCs and short runs of NSVT but not sustained VT. - Potassium 3.6 today. Will give 40 mEq of K-Dur. - Will check Magnesium. - Continue Coreg as above. - Continue to monitor on telemetry. - ICD followed by Dr. Caryl Comes.  Hypertension - BP mildly elevated but has not received morning medications yet. - Continue home Amlodipine 2.5mg  daily, Lisinopril 20mg  daily, and Coreg 6.25mg  twice  daily. - Hopefully will also improve with diuresis. - Consider switching to [facility] but would have to let Lisinopril washout.  Hyperlipidemia - LDL 53 in 2019. - Continue Lipitor 40mg  daily and Zetia 10mg  daily. - Will recheck fasting lipid panel tomorrow morning.    For questions or updates, please contact [place] Please consult [email or web address] for contact info under        Signed, Darreld Mclean, PA-C  09/23/2020, 6:55 AM    Personally seen and examined. Agree with APP above with the following comments: Briefly 89 M with CAD s/p CABG in 1990s VT s/p ICD, and HFmrEF seen peri-2022 Covid-19 infection.  Called in with new SOB, orthopnea and LE edema Patient notes that he has not been able to lay flat comfortable for the last 2-3 weeks.   Exam notable for LE edema, holosystolic murmur, no diastolic murmur auscultated, crackles in the bases Labs notable for stable creatinine, K > 3.5;  Novel troponin < 100; BNP 2008 Would recommend IV diuresis; with this regimen, patient is diuresing well.   BB continued in the setting of short runs of NSVT Aggressive potassium replacement Planned for RHC/LHC 09/24/20. If no coronary intervention; will consider ACE washout and ARNI administration  Rudean Haskell, MD [place]  [address] [place], [place] [phone]  12:01 PM

## 2020-09-24 ENCOUNTER — Inpatient Hospital Stay (HOSPITAL_COMMUNITY)
Admission: EM | Disposition: A | Discharge status: Home / Self Care | Payer: Self-pay | Source: Home / Self Care | Attending: Internal Medicine

## 2020-09-24 ENCOUNTER — Encounter (HOSPITAL_COMMUNITY): Payer: Self-pay | Admitting: Cardiovascular Disease

## 2020-09-24 DIAGNOSIS — E876 Hypokalemia: Secondary | ICD-10-CM | POA: Diagnosis present

## 2020-09-24 DIAGNOSIS — I10 Essential (primary) hypertension: Secondary | ICD-10-CM | POA: Diagnosis not present

## 2020-09-24 DIAGNOSIS — Z7902 Long term (current) use of antithrombotics/antiplatelets: Secondary | ICD-10-CM | POA: Diagnosis not present

## 2020-09-24 DIAGNOSIS — I248 Other forms of acute ischemic heart disease: Secondary | ICD-10-CM | POA: Diagnosis present

## 2020-09-24 DIAGNOSIS — K219 Gastro-esophageal reflux disease without esophagitis: Secondary | ICD-10-CM | POA: Diagnosis present

## 2020-09-24 DIAGNOSIS — E785 Hyperlipidemia, unspecified: Secondary | ICD-10-CM | POA: Diagnosis present

## 2020-09-24 DIAGNOSIS — Z79899 Other long term (current) drug therapy: Secondary | ICD-10-CM | POA: Diagnosis not present

## 2020-09-24 DIAGNOSIS — I252 Old myocardial infarction: Secondary | ICD-10-CM | POA: Diagnosis not present

## 2020-09-24 DIAGNOSIS — I959 Hypotension, unspecified: Secondary | ICD-10-CM | POA: Diagnosis present

## 2020-09-24 DIAGNOSIS — I5041 Acute combined systolic (congestive) and diastolic (congestive) heart failure: Secondary | ICD-10-CM | POA: Diagnosis not present

## 2020-09-24 DIAGNOSIS — I5043 Acute on chronic combined systolic (congestive) and diastolic (congestive) heart failure: Secondary | ICD-10-CM | POA: Diagnosis present

## 2020-09-24 DIAGNOSIS — I25718 Atherosclerosis of autologous vein coronary artery bypass graft(s) with other forms of angina pectoris: Secondary | ICD-10-CM | POA: Diagnosis not present

## 2020-09-24 DIAGNOSIS — I472 Ventricular tachycardia: Secondary | ICD-10-CM | POA: Diagnosis present

## 2020-09-24 DIAGNOSIS — I200 Unstable angina: Secondary | ICD-10-CM | POA: Diagnosis not present

## 2020-09-24 DIAGNOSIS — I25119 Atherosclerotic heart disease of native coronary artery with unspecified angina pectoris: Secondary | ICD-10-CM | POA: Diagnosis not present

## 2020-09-24 DIAGNOSIS — I25118 Atherosclerotic heart disease of native coronary artery with other forms of angina pectoris: Secondary | ICD-10-CM | POA: Diagnosis not present

## 2020-09-24 DIAGNOSIS — G479 Sleep disorder, unspecified: Secondary | ICD-10-CM | POA: Diagnosis present

## 2020-09-24 DIAGNOSIS — Z7982 Long term (current) use of aspirin: Secondary | ICD-10-CM | POA: Diagnosis not present

## 2020-09-24 DIAGNOSIS — I493 Ventricular premature depolarization: Secondary | ICD-10-CM | POA: Diagnosis present

## 2020-09-24 DIAGNOSIS — Z95810 Presence of automatic (implantable) cardiac defibrillator: Secondary | ICD-10-CM | POA: Diagnosis not present

## 2020-09-24 DIAGNOSIS — Z888 Allergy status to other drugs, medicaments and biological substances status: Secondary | ICD-10-CM | POA: Diagnosis not present

## 2020-09-24 DIAGNOSIS — Z20822 Contact with and (suspected) exposure to covid-19: Secondary | ICD-10-CM | POA: Diagnosis present

## 2020-09-24 DIAGNOSIS — I110 Hypertensive heart disease with heart failure: Secondary | ICD-10-CM | POA: Diagnosis present

## 2020-09-24 DIAGNOSIS — I255 Ischemic cardiomyopathy: Secondary | ICD-10-CM | POA: Diagnosis present

## 2020-09-24 DIAGNOSIS — Z8249 Family history of ischemic heart disease and other diseases of the circulatory system: Secondary | ICD-10-CM | POA: Diagnosis not present

## 2020-09-24 DIAGNOSIS — I25110 Atherosclerotic heart disease of native coronary artery with unstable angina pectoris: Secondary | ICD-10-CM | POA: Diagnosis present

## 2020-09-24 DIAGNOSIS — Z791 Long term (current) use of non-steroidal anti-inflammatories (NSAID): Secondary | ICD-10-CM | POA: Diagnosis not present

## 2020-09-24 HISTORY — PX: RIGHT/LEFT HEART CATH AND CORONARY/GRAFT ANGIOGRAPHY: CATH118267

## 2020-09-24 LAB — POCT I-STAT EG7
Acid-Base Excess: 3 mmol/L — ABNORMAL HIGH (ref 0.0–2.0)
Acid-Base Excess: 4 mmol/L — ABNORMAL HIGH (ref 0.0–2.0)
Bicarbonate: 28.9 mmol/L — ABNORMAL HIGH (ref 20.0–28.0)
Bicarbonate: 30.1 mmol/L — ABNORMAL HIGH (ref 20.0–28.0)
Calcium, Ion: 1.2 mmol/L (ref 1.15–1.40)
Calcium, Ion: 1.22 mmol/L (ref 1.15–1.40)
HCT: 38 % — ABNORMAL LOW (ref 39.0–52.0)
HCT: 38 % — ABNORMAL LOW (ref 39.0–52.0)
Hemoglobin: 12.9 g/dL — ABNORMAL LOW (ref 13.0–17.0)
Hemoglobin: 12.9 g/dL — ABNORMAL LOW (ref 13.0–17.0)
O2 Saturation: 69 %
O2 Saturation: 69 %
Potassium: 3.5 mmol/L (ref 3.5–5.1)
Potassium: 3.5 mmol/L (ref 3.5–5.1)
Sodium: 142 mmol/L (ref 135–145)
Sodium: 142 mmol/L (ref 135–145)
TCO2: 30 mmol/L (ref 22–32)
TCO2: 32 mmol/L (ref 22–32)
pCO2, Ven: 48.5 mmHg (ref 44.0–60.0)
pCO2, Ven: 50.4 mmHg (ref 44.0–60.0)
pH, Ven: 7.383 (ref 7.250–7.430)
pH, Ven: 7.384 (ref 7.250–7.430)
pO2, Ven: 37 mmHg (ref 32.0–45.0)
pO2, Ven: 37 mmHg (ref 32.0–45.0)

## 2020-09-24 LAB — BASIC METABOLIC PANEL
Anion gap: 10 (ref 5–15)
BUN: 24 mg/dL — ABNORMAL HIGH (ref 8–23)
CO2: 27 mmol/L (ref 22–32)
Calcium: 9 mg/dL (ref 8.9–10.3)
Chloride: 102 mmol/L (ref 98–111)
Creatinine, Ser: 1.21 mg/dL (ref 0.61–1.24)
GFR, Estimated: >60 mL/min (ref >60)
Glucose, Bld: 102 mg/dL — ABNORMAL HIGH (ref 70–99)
Potassium: 3.4 mmol/L — ABNORMAL LOW (ref 3.5–5.1)
Sodium: 139 mmol/L (ref 135–145)

## 2020-09-24 LAB — POCT I-STAT 7, (LYTES, BLD GAS, ICA,H+H)
Acid-Base Excess: 0 mmol/L (ref 0.0–2.0)
Bicarbonate: 26.1 mmol/L (ref 20.0–28.0)
Calcium, Ion: 1.18 mmol/L (ref 1.15–1.40)
HCT: 38 % — ABNORMAL LOW (ref 39.0–52.0)
Hemoglobin: 12.9 g/dL — ABNORMAL LOW (ref 13.0–17.0)
O2 Saturation: 99 %
Potassium: 3.3 mmol/L — ABNORMAL LOW (ref 3.5–5.1)
Sodium: 132 mmol/L — ABNORMAL LOW (ref 135–145)
TCO2: 28 mmol/L (ref 22–32)
pCO2 arterial: 48.4 mmHg — ABNORMAL HIGH (ref 32.0–48.0)
pH, Arterial: 7.341 — ABNORMAL LOW (ref 7.350–7.450)
pO2, Arterial: 129 mmHg — ABNORMAL HIGH (ref 83.0–108.0)

## 2020-09-24 LAB — LIPID PANEL
Cholesterol: 135 mg/dL (ref 0–200)
HDL: 32 mg/dL — ABNORMAL LOW (ref >40)
LDL Cholesterol: 81 mg/dL (ref 0–99)
Total CHOL/HDL Ratio: 4.2 RATIO
Triglycerides: 108 mg/dL (ref <150)
VLDL: 22 mg/dL (ref 0–40)

## 2020-09-24 SURGERY — RIGHT/LEFT HEART CATH AND CORONARY/GRAFT ANGIOGRAPHY
Anesthesia: LOCAL

## 2020-09-24 MED ORDER — MIDAZOLAM HCL 2 MG/2ML IJ SOLN
INTRAMUSCULAR | Status: DC | PRN
  Administered 2020-09-24: 1 mg via INTRAVENOUS

## 2020-09-24 MED ORDER — HEPARIN (PORCINE) IN NACL 1000-0.9 UT/500ML-% IV SOLN
INTRAVENOUS | Status: AC
  Filled 2020-09-24: qty 500

## 2020-09-24 MED ORDER — ATORVASTATIN CALCIUM 80 MG PO TABS
80.0000 mg | ORAL_TABLET | Freq: Every day | ORAL | Status: DC
  Administered 2020-09-24: 80 mg via ORAL
  Filled 2020-09-24: qty 1

## 2020-09-24 MED ORDER — LIDOCAINE HCL (PF) 1 % IJ SOLN
INTRAMUSCULAR | Status: AC
  Filled 2020-09-24: qty 30

## 2020-09-24 MED ORDER — ACETAMINOPHEN 325 MG PO TABS
ORAL_TABLET | ORAL | Status: AC
  Filled 2020-09-24: qty 2

## 2020-09-24 MED ORDER — HEPARIN (PORCINE) IN NACL 1000-0.9 UT/500ML-% IV SOLN
INTRAVENOUS | Status: DC | PRN
  Administered 2020-09-24 (×2): 500 mL

## 2020-09-24 MED ORDER — FENTANYL CITRATE (PF) 100 MCG/2ML IJ SOLN
INTRAMUSCULAR | Status: AC
  Filled 2020-09-24: qty 2

## 2020-09-24 MED ORDER — SODIUM CHLORIDE 0.9 % IV SOLN: INTRAVENOUS | Status: AC

## 2020-09-24 MED ORDER — VERAPAMIL HCL 2.5 MG/ML IV SOLN
INTRAVENOUS | Status: AC
  Filled 2020-09-24: qty 2

## 2020-09-24 MED ORDER — MIDAZOLAM HCL 2 MG/2ML IJ SOLN
INTRAMUSCULAR | Status: AC
  Filled 2020-09-24: qty 2

## 2020-09-24 MED ORDER — POTASSIUM CHLORIDE CRYS ER 20 MEQ PO TBCR
40.0000 meq | EXTENDED_RELEASE_TABLET | Freq: Once | ORAL | Status: AC
  Administered 2020-09-24: 40 meq via ORAL
  Filled 2020-09-24: qty 2

## 2020-09-24 MED ORDER — LIDOCAINE HCL (PF) 1 % IJ SOLN
INTRAMUSCULAR | Status: DC | PRN
  Administered 2020-09-24: 5 mL via INTRADERMAL
  Administered 2020-09-24: 30 mL via INTRADERMAL

## 2020-09-24 MED ORDER — IOHEXOL 350 MG/ML SOLN
INTRAVENOUS | Status: DC | PRN
  Administered 2020-09-24: 90 mL

## 2020-09-24 MED ORDER — HEPARIN SODIUM (PORCINE) 1000 UNIT/ML IJ SOLN
INTRAMUSCULAR | Status: AC
  Filled 2020-09-24: qty 1

## 2020-09-24 MED ORDER — FENTANYL CITRATE (PF) 100 MCG/2ML IJ SOLN
INTRAMUSCULAR | Status: DC | PRN
  Administered 2020-09-24: 25 ug via INTRAVENOUS

## 2020-09-24 MED ORDER — CARVEDILOL 3.125 MG PO TABS
3.1250 mg | ORAL_TABLET | Freq: Two times a day (BID) | ORAL | Status: DC
  Administered 2020-09-24 – 2020-09-25 (×2): 3.125 mg via ORAL
  Filled 2020-09-24 (×2): qty 1

## 2020-09-24 SURGICAL SUPPLY — 17 items
CATH INFINITI 5 FR IM (CATHETERS) ×2 IMPLANT
CATH INFINITI 5 FR RCB (CATHETERS) ×2 IMPLANT
CATH INFINITI 5FR MULTPACK ANG (CATHETERS) ×2 IMPLANT
CATH SWAN GANZ 7F STRAIGHT (CATHETERS) ×2 IMPLANT
GLIDESHEATH SLEND SS 6F .021 (SHEATH) IMPLANT
GLIDESHEATH SLENDER 7FR .021G (SHEATH) ×2 IMPLANT
GUIDEWIRE .025 260CM (WIRE) ×2 IMPLANT
GUIDEWIRE INQWIRE 1.5J.035X260 (WIRE) ×2 IMPLANT
INQWIRE 1.5J .035X260CM (WIRE) ×4
KIT HEART LEFT (KITS) ×2 IMPLANT
PACK CARDIAC CATHETERIZATION (CUSTOM PROCEDURE TRAY) ×2 IMPLANT
SHEATH PINNACLE 5F 10CM (SHEATH) ×2 IMPLANT
SHEATH PINNACLE 7F 10CM (SHEATH) IMPLANT
TRANSDUCER W/STOPCOCK (MISCELLANEOUS) ×2 IMPLANT
TUBING CIL FLEX 10 FLL-RA (TUBING) ×2 IMPLANT
WIRE EMERALD 3MM-J .035X150CM (WIRE) ×2 IMPLANT
WIRE EMERALD 3MM-J .035X260CM (WIRE) ×2 IMPLANT

## 2020-09-24 NOTE — Progress Notes (Signed)
Heart Failure Stewardship Pharmacist Progress Note   PCP: Pcp, No PCP-Cardiologist: Quay Burow, MD    HPI:  75 yo M with PMH of CAD s/p CABG, VT s/p ICD, HTN, HLD, GERD, and remote smoking history. Presented to the ED on 09/22/20 with DOE and orthopnea. Last ECHO was done on 09/13/20 and LVEF was 40-45%. Repeat ECHO on 09/24/20 with LVEF of 20-25%. S/p R/LHC on 09/24/20 showed stable CAD, and filling pressures: RA 7, PA 25, wedge 18, CO 4.9, CI 2.5) - recommended probable long term DAPT for CAD and GDMT for HFrEF.  Current HF Medications: Furosemide 40 mg IV BID Carvedilol 3.125 mg BID Losartan 100 mg daily Jardiance 10 mg daily  Prior to admission HF Medications: Carvedilol 6.25 mg BID Lisinopril 20 mg daily  Pertinent Lab Values: . Serum creatinine 1.21, BUN 24, Potassium 3.4, Sodium 139, BNP 2008.2, Magnesium 1.9   Vital Signs: . Weight: 181 lbs (admission weight: 186 lbs) . Blood pressure: 120-130/70s . Heart rate: 50-60s   Medication Assistance / Insurance Benefits Check: Does the patient have prescription insurance?  Yes Type of insurance plan: [employer]  Does the patient qualify for medication assistance through manufacturers or grants?   Pending . Eligible grants and/or patient assistance programs: pending . Medication assistance applications in progress: none  . Medication assistance applications approved: none Approved medication assistance renewals will be completed by: pending   Outpatient Pharmacy:  Prior to admission outpatient pharmacy: [facility] Is the patient willing to use [facility] at discharge? Yes Is the patient willing to transition their outpatient pharmacy to utilize a [facility] outpatient pharmacy?   Pending    Assessment: 1. Acute on chronic systolic CHF (EF 03-21%), due to ICM. NYHA class III symptoms. - Continue furosemide 40 mg IV BID. Potassium replaced. - Continue carvedilol 3.125 mg BID - Transitioned from  lisinopril to losartan 100 mg daily. Eventual optimization to Entresto. Last dose of lisinopril 3/28 ~10am - Continue Jardiance 10 mg daily - Consider starting spironolactone prior to discharge   Plan: 1) Medication changes recommended at this time: - Continue IV diuresis  - Start Entresto in AM  2) Patient assistance: - Jardiance copay $47 per month - Entresto copay $47 per month - Appreciate assistance from Willette Alma, CPhT  3)  Education  - To be completed prior to discharge  Kerby Nora, PharmD, BCPS Heart Failure Stewardship Pharmacist Phone [phone]

## 2020-09-24 NOTE — Interval H&P Note (Signed)
Cath Lab Visit (complete for each Cath Lab visit)  Clinical Evaluation Leading to the Procedure:   ACS: No.  Non-ACS:    Anginal Classification: CCS II  Anti-ischemic medical therapy: Maximal Therapy (2 or more classes of medications)  Non-Invasive Test Results: No non-invasive testing performed  Prior CABG: Previous CABG      History and Physical Interval Note:  09/24/2020 9:09 AM  Victor Estes  has presented today for surgery, with the diagnosis of chest pain.  The various methods of treatment have been discussed with the patient and family. After consideration of risks, benefits and other options for treatment, the patient has consented to  Procedure(s): RIGHT/LEFT HEART CATH AND CORONARY/GRAFT ANGIOGRAPHY (N/A) as a surgical intervention.  The patient's history has been reviewed, patient examined, no change in status, stable for surgery.  I have reviewed the patient's chart and labs.  Questions were answered to the patient's satisfaction.     Shelva Majestic

## 2020-09-24 NOTE — Progress Notes (Addendum)
Progress Note  Patient Name: Victor Estes Rounds Date of Encounter: 09/24/2020  [facility] Cardiologist: Quay Burow, MD   Subjective   No acute overnight events. Just came back from [facility]. Breathing much better today but not quite back to baseline. Able to lay flat right now without any distress.  Inpatient Medications    Scheduled Meds: . [MAR Hold] amLODipine  2.5 mg Oral Daily  . [MAR Hold] aspirin EC  81 mg Oral Daily  . [MAR Hold] atorvastatin  40 mg Oral QHS  . [MAR Hold] carvedilol  6.25 mg Oral BID WC  . [MAR Hold] cholecalciferol  2,000 Units Oral Daily  . [MAR Hold] empagliflozin  10 mg Oral Daily  . [MAR Hold] enoxaparin (LOVENOX) injection  40 mg Subcutaneous Daily  . [MAR Hold] ezetimibe  10 mg Oral Daily  . [MAR Hold] furosemide  40 mg Intravenous BID  . [MAR Hold] losartan  100 mg Oral Daily  . [MAR Hold] potassium chloride  40 mEq Oral Once  . sodium chloride flush  3 mL Intravenous Q12H  . [MAR Hold] tamsulosin  0.4 mg Oral QPC breakfast   Continuous Infusions: . sodium chloride    . sodium chloride 10 mL/hr at 09/24/20 0559   PRN Meds: sodium chloride, [MAR Hold] acetaminophen, [MAR Hold] ondansetron (ZOFRAN) IV, sodium chloride flush, [MAR Hold] traMADol, [MAR Hold] zolpidem   Vital Signs    Vitals:   09/24/20 0955 09/24/20 1000 09/24/20 1005 09/24/20 1015  BP: 115/70 126/65  133/64  Pulse: (!) 55 (!) 0 (!) 0 (!) 53  Resp: (!) 22 20 (!) 0 (!) 22  Temp:      TempSrc:      SpO2: 96% 94% 94% 95%  Weight:      Height:        Intake/Output Summary (Last 24 hours) at 09/24/2020 1026 Last data filed at 09/24/2020 0800 Gross per 24 hour  Intake --  Output 3500 ml  Net -3500 ml   Last 3 Weights 09/24/2020 09/23/2020 09/22/2020  Weight (lbs) 181 lb 10.5 oz 186 lb 11.7 oz 190 lb  Weight (kg) 82.4 kg 84.7 kg 86.183 kg      Telemetry    Sinus rhythm with rates in the 50's to 60's. PVC. - Personally Reviewed  ECG    No new ECG tracing  today. - Personally Reviewed  Physical Exam   GEN: No acute distress.   Neck: No JVD. Cardiac: Bradycardic with normal rhythm. No murmurs, rubs, or gallops. Right femoral cath site soft with no signs of hematoma. Respiratory: Clear to auscultation bilaterally. No wheezes, rhonchi, or rales. GI: Soft, non-distended, and non-tender.  MS: No lower extremity edema. No deformity. Skin: Warm and dry. Neuro:  No focal deficits. Psych: Normal affect. Responds appropriately.  Labs    High Sensitivity Troponin:   Recent Labs  Lab 09/22/20 2057 09/22/20 2342 09/23/20 0011  TROPONINIHS 35* 33* 32*      Chemistry Recent Labs  Lab 09/22/20 2057 09/23/20 0408 09/24/20 0348  NA 134* 135 139  K 4.3 3.6 3.4*  CL 101 105 102  CO2 25 22 27   GLUCOSE 167* 125* 102*  BUN 18 17 24*  CREATININE 1.11 0.98 1.21  CALCIUM 8.9 8.8* 9.0  PROT 6.9  --   --   ALBUMIN 3.8  --   --   AST 28  --   --   ALT 32  --   --   ALKPHOS 84  --   --  BILITOT 1.2  --   --   GFRNONAA >60 >60 >60  ANIONGAP 8 8 10      Hematology Recent Labs  Lab 09/22/20 2057 09/23/20 0408  WBC 12.2* 11.1*  RBC 4.69 4.49  HGB 13.7 13.3  HCT 42.6 39.6  MCV 90.8 88.2  MCH 29.2 29.6  MCHC 32.2 33.6  RDW 14.5 14.4  PLT 301 271    BNP Recent Labs  Lab 09/22/20 2057  BNP 2,008.2*     DDimer No results for input(s): DDIMER in the last 168 hours.   Radiology    DG Chest 2 View  Result Date: 09/22/2020 CLINICAL DATA:  Dyspnea EXAM: CHEST - 2 VIEW COMPARISON:  04/30/2015 FINDINGS: The heart size and mediastinal contours are within normal limits. Both lungs are clear. The visualized skeletal structures are unremarkable. Unchanged left chest wall AICD and remote median sternotomy. IMPRESSION: No active cardiopulmonary disease. Electronically Signed   By: Ulyses Jarred M.D.   On: 09/22/2020 21:46   ECHOCARDIOGRAM COMPLETE  Result Date: 09/23/2020    ECHOCARDIOGRAM REPORT   Patient Name:   NAKOTA ELSEN Date  of Exam: 09/23/2020 Medical Rec #:  [id number]           Height:       68.0 in Accession #:    [id number]          Weight:       190.0 lb Date of Birth:  11-24-1945            BSA:          2.000 m Patient Age:    75 years            BP:           138/105 mmHg Patient Gender: M                   HR:           53 bpm. Exam Location:  Inpatient Procedure: 2D Echo, Cardiac Doppler and Color Doppler Indications:    I50.23 Acute on chronic systolic (congestive) heart failure  History:        Patient has prior history of Echocardiogram examinations, most                 recent 09/13/2020. Previous Myocardial Infarction, Carotid                 Disease; Risk Factors:Hypertension and Dyslipidemia. Cancer.                 GERD.  Sonographer:    Jonelle Sidle Dance Referring Phys: [id number] [place]  1. Left ventricular ejection fraction, by estimation, is 20 to 25%. The left ventricle has severely decreased function. The left ventricle demonstrates global hypokinesis. The left ventricular internal cavity size was mildly dilated. Left ventricular diastolic parameters are consistent with Grade II diastolic dysfunction (pseudonormalization).  2. Right ventricular systolic function is moderately reduced. The right ventricular size is normal. There is mildly elevated pulmonary artery systolic pressure. The estimated right ventricular systolic pressure is 68.1 mmHg.  3. Left atrial size was severely dilated.  4. Right atrial size was moderately dilated.  5. The mitral valve is normal in structure. Trivial mitral valve regurgitation. No evidence of mitral stenosis.  6. The aortic valve is tricuspid. Aortic valve regurgitation is trivial. No aortic stenosis is present.  7. The inferior vena cava is dilated in size with <50% respiratory variability, suggesting right atrial  pressure of 15 mmHg. FINDINGS  Left Ventricle: Left ventricular ejection fraction, by estimation, is 20 to 25%. The left ventricle has severely decreased  function. The left ventricle demonstrates global hypokinesis. The left ventricular internal cavity size was mildly dilated. There is no left ventricular hypertrophy. Left ventricular diastolic parameters are consistent with Grade II diastolic dysfunction (pseudonormalization). Right Ventricle: The right ventricular size is normal. No increase in right ventricular wall thickness. Right ventricular systolic function is moderately reduced. There is mildly elevated pulmonary artery systolic pressure. The tricuspid regurgitant velocity is 2.54 m/s, and with an assumed right atrial pressure of 15 mmHg, the estimated right ventricular systolic pressure is 63.0 mmHg. Left Atrium: Left atrial size was severely dilated. Right Atrium: Right atrial size was moderately dilated. Pericardium: There is no evidence of pericardial effusion. Mitral Valve: The mitral valve is normal in structure. Trivial mitral valve regurgitation. No evidence of mitral valve stenosis. Tricuspid Valve: The tricuspid valve is normal in structure. Tricuspid valve regurgitation is mild. Aortic Valve: The aortic valve is tricuspid. Aortic valve regurgitation is trivial. Aortic regurgitation PHT measures 653 msec. No aortic stenosis is present. Aortic valve mean gradient measures 7.0 mmHg. Aortic valve peak gradient measures 12.0 mmHg. Aortic valve area, by VTI measures 1.38 cm. Pulmonic Valve: The pulmonic valve was normal in structure. Pulmonic valve regurgitation is not visualized. Aorta: The aortic root is normal in size and structure. Venous: The inferior vena cava is dilated in size with less than 50% respiratory variability, suggesting right atrial pressure of 15 mmHg. IAS/Shunts: No atrial level shunt detected by color flow Doppler. Additional Comments: A device lead is visualized in the right ventricle.  LEFT VENTRICLE PLAX 2D LVIDd:         6.30 cm  Diastology LVIDs:         5.20 cm  LV e' medial:    4.57 cm/s LV PW:         1.40 cm  LV E/e'  medial:  16.9 LV IVS:        0.80 cm  LV e' lateral:   7.07 cm/s LVOT diam:     2.20 cm  LV E/e' lateral: 10.9 LV SV:         49 LV SV Index:   24 LVOT Area:     3.80 cm  RIGHT VENTRICLE            IVC RV Basal diam:  3.90 cm    IVC diam: 2.60 cm RV Mid diam:    3.30 cm RV S prime:     8.38 cm/s TAPSE (M-mode): 1.6 cm LEFT ATRIUM              Index       RIGHT ATRIUM           Index LA diam:        5.80 cm  2.90 cm/m  RA Area:     27.70 cm LA Vol (A2C):   103.0 ml 51.51 ml/m RA Volume:   97.20 ml  48.61 ml/m LA Vol (A4C):   125.0 ml 62.51 ml/m LA Biplane Vol: 119.0 ml 59.51 ml/m  AORTIC VALVE AV Area (Vmax):    1.36 cm AV Area (Vmean):   1.35 cm AV Area (VTI):     1.38 cm AV Vmax:           173.00 cm/s AV Vmean:          124.000 cm/s AV VTI:  0.353 m AV Peak Grad:      12.0 mmHg AV Mean Grad:      7.0 mmHg LVOT Vmax:         62.00 cm/s LVOT Vmean:        43.900 cm/s LVOT VTI:          0.128 m LVOT/AV VTI ratio: 0.36 AI PHT:            653 msec  AORTA Ao Root diam: 3.50 cm Ao Asc diam:  3.10 cm MITRAL VALVE               TRICUSPID VALVE MV Area (PHT): 4.23 cm    TR Peak grad:   25.8 mmHg MV Decel Time: 180 msec    TR Vmax:        254.00 cm/s MV E velocity: 77.35 cm/s                            SHUNTS                            Systemic VTI:  0.13 m                            Systemic Diam: 2.20 cm Loralie Champagne MD Electronically signed by Loralie Champagne MD Signature Date/Time: 09/23/2020/2:44:33 PM    Final     Cardiac Studies   Echocardiogram 09/13/2020: Impressions: 1. Left ventricular ejection fraction, by estimation, is 40 to 45%. The  left ventricle has mildly decreased function. The left ventricle  demonstrates regional wall motion abnormalities (see scoring  diagram/findings for description). The left ventricular  internal cavity size was moderately dilated. Left ventricular diastolic  function could not be evaluated. There is severe akinesis of the left  ventricular, basal-mid  inferior wall and inferolateral wall.  2. Right ventricular systolic function is low normal. The right  ventricular size is mildly enlarged. There is normal pulmonary artery  systolic pressure.  3. Left atrial size was moderately dilated.  4. Right atrial size was moderately dilated.  5. The mitral valve is abnormal. Mild mitral valve regurgitation.  6. The aortic valve is tricuspid. Aortic valve regurgitation trivial to  mild. Mild aortic valve sclerosis is present, with no evidence of aortic  valve stenosis.  7. Mildly dilated pulmonary artery.  8. The inferior vena cava is dilated in size with >50% respiratory  variability, suggesting right atrial pressure of 8 mmHg.   Comparison(s): No significant change from prior study. 04/30/2015: LVEF  40-45%, mild AR.  _______________  Echocardiogram 09/23/2020: Impressions: 1. Left ventricular ejection fraction, by estimation, is 20 to 25%. The  left ventricle has severely decreased function. The left ventricle  demonstrates global hypokinesis. The left ventricular internal cavity size  was mildly dilated. Left ventricular  diastolic parameters are consistent with Grade II diastolic dysfunction  (pseudonormalization).  2. Right ventricular systolic function is moderately reduced. The right  ventricular size is normal. There is mildly elevated pulmonary artery  systolic pressure. The estimated right ventricular systolic pressure is  19.5 mmHg.  3. Left atrial size was severely dilated.  4. Right atrial size was moderately dilated.  5. The mitral valve is normal in structure. Trivial mitral valve  regurgitation. No evidence of mitral stenosis.  6. The aortic valve is tricuspid. Aortic valve regurgitation is trivial.  No aortic stenosis  is present.  7. The inferior vena cava is dilated in size with <50% respiratory  variability, suggesting right atrial pressure of 15 mmHg.  _______________  Right/Left Cardiac Catheterization  09/24/2020:  Mid LM to Dist LM lesion is 95% stenosed.  Ost Cx lesion is 95% stenosed.  Dist LM to Ost LAD lesion is 80% stenosed.  Mid LAD lesion is 100% stenosed.  Mid Cx lesion is 95% stenosed.  2nd Mrg lesion is 100% stenosed.  Ost RCA to Prox RCA lesion is 60% stenosed.  Prox RCA to Mid RCA lesion is 85% stenosed.  RPAV lesion is 100% stenosed.  Origin lesion is 100% stenosed.  Diagnostic Dominance: Right     Patient Profile     75 y.o. male with a history of CAD with remote MI in 1993 and subsequent CABG x4 in 1995 and recent low risk Myoview with no evidence of ischemia on 09/06/2020, VT in 03/2015 s/p ICD, carotid artery disease, hypertension, hyperlipidemia, GERD, and remote smoking history who was admitted with acute CHF after presenting with worsening dyspnea.  Assessment & Plan    Acute on Chronic Combined CHF - Patient presented with progressive shortness of breath, orthopnea, and lower extremity edema. - BNP elevated at 2,008. - Chest x-ray showed no acute findings.  - Echo this admission showed Echo showed LVEF of 20-25% with global hypokinesis and grade 2 diastolic dysfunction. RV size normal with moderately reduced systolic function and mildly elevated PASP of 40.8 mmHg. EF down from 40-45% on recent Echo on 09/13/2020.  - Currently on IV Lasix 40mg  twice daily. Documented 3.9 L of urinary output yesterday and net negative 4.2L this admission. Weight down 5lbs from yesterday. Creatinine stable but slight bump in BUN today. - LVEDP elevated at 62mmHg on cath today. - Will continue IV Lasix today and hopefully transition to PO tomorrow. - Lisinopril stopped and switched to Losartan 100mg  daily today. Plan to start [facility] tomorrow after 36 hours of ACEi washout if BP and renal function allows. - Will decrease Coreg to 3.125mg  twice daily given rates in the 50's. - Started on Jardiance this admission. - Continue to monitor daily weight, strict I/O's, and renal  function.  CAD Demand Ischemia - History of remote CABG x4 in 1995. No angina. - EKG does show T wave inversions in V3-V6. - High-sensitivity troponin minimally elevated and flat at 35 >> 33 >> 32. Not consistent with ACS. Consistent with demand ischemia from CHF. - Recent Myoview showed evidence of prior infarct but no ischemia. - R/LHC today showed stable CAD. Old occluded SVT to distal RCA but patent LIMA to LAD and SVG to OM2. No PCI required. - Continue aspirin, beta-blocker, high-intensity statin and Zetia.  VT - History of VT in 2016. S/p ICD. - Telemetry shows frequent PVCs and short runs of NSVT but not sustained VT. - Potassium 3.4 today. Will replete. - Magnesium 1.9 yesterday. - Continue Coreg as above. - Continue to monitor on telemetry. - ICD followed by Dr. Caryl Comes.  Hypertension - BP improved with diuresis. - Lisinopril stopped and Losartan started as above. Plan is to hopefully switch to [facility] tomorrow. - Will decrease Coreg to 3.125mg  twice daily given baseline rates in the 50's.  - Will stop Amlodipine to allow for optimization of CHF medications.  Hyperlipidemia - Lipid panel this admission: Total cholesterol 135, Triglycerides 108, HDL 32, LDL 81.  - LDL goal <70 given CAD. - On Lipitor 40mg  daily and Zetia 10mg  daily at home. Will  increase Lipitor to 80mg  daily.  - Recheck lipid panel and LFTs in 2 months.   For questions or updates, please contact [place] Please consult [email or web address] for contact info under        Signed, Darreld Mclean, PA-C  09/24/2020, 10:26 AM     Personally seen and examined. Agree with APP above with the following comments: Briefly 75 yo M with symptomatic HFmrEF Patient notes that he was able to sleep last night now that he had diuresed Exam notable for R groin site clean and dry without bruit or hematoma, edema has improved Labs notable for slight increase in creatinine Personally reviewed relevant tests; no  new obstructive lesions on cath Would recommend changes as above; discussed GDMT, salt and water restrictions with family.  Patient start ambulatory daily weights.  Potential DC 3/30 or 3/31; but likely will see a slight change in creatinine post diuresis and LHC.  Rudean Haskell, MD [id number] [place], [place] [address] [phone]  12:17 PM

## 2020-09-24 NOTE — Progress Notes (Signed)
Received pt from cath lab at 1100. VSS on RA. R groin and brachial site stable. Pt on bedrest at this time. Frequent vitals initiated. Noted that pt O2 sat would dip to low 80's on RA then would come back up to high 90's quickly when pt encouraged to take a deep breath. Pt placed on 2L O2 [place]. Will continue to monitor.

## 2020-09-24 NOTE — Progress Notes (Signed)
Site area: rt groin arterial site Site Prior to Removal:  Level 0 Pressure Applied For: 20 minutes Manual:   yes Patient Status During Pull:  stable Post Pull Site:  Level 0 Post Pull Instructions Given:  yes Post Pull Pulses Present: rt dp palpable Dressing Applied:  Gauze and tegaderm Bedrest begins @ 1045 Comments:

## 2020-09-25 ENCOUNTER — Encounter (HOSPITAL_COMMUNITY): Payer: Self-pay | Admitting: Internal Medicine

## 2020-09-25 ENCOUNTER — Other Ambulatory Visit: Payer: Self-pay | Admitting: Student

## 2020-09-25 DIAGNOSIS — I5043 Acute on chronic combined systolic (congestive) and diastolic (congestive) heart failure: Secondary | ICD-10-CM

## 2020-09-25 DIAGNOSIS — Z95810 Presence of automatic (implantable) cardiac defibrillator: Secondary | ICD-10-CM

## 2020-09-25 DIAGNOSIS — I248 Other forms of acute ischemic heart disease: Secondary | ICD-10-CM

## 2020-09-25 LAB — BASIC METABOLIC PANEL
Anion gap: 8 (ref 5–15)
BUN: 21 mg/dL (ref 8–23)
CO2: 30 mmol/L (ref 22–32)
Calcium: 9 mg/dL (ref 8.9–10.3)
Chloride: 102 mmol/L (ref 98–111)
Creatinine, Ser: 1.12 mg/dL (ref 0.61–1.24)
GFR, Estimated: >60 mL/min (ref >60)
Glucose, Bld: 103 mg/dL — ABNORMAL HIGH (ref 70–99)
Potassium: 3.6 mmol/L (ref 3.5–5.1)
Sodium: 140 mmol/L (ref 135–145)

## 2020-09-25 MED ORDER — EMPAGLIFLOZIN 10 MG PO TABS: 10.0000 mg | ORAL_TABLET | Freq: Every day | ORAL | 2 refills | Status: DC

## 2020-09-25 MED ORDER — FUROSEMIDE 20 MG PO TABS: 20.0000 mg | ORAL_TABLET | Freq: Every day | ORAL | 1 refills | Status: DC | PRN

## 2020-09-25 MED ORDER — NITROGLYCERIN 0.4 MG SL SUBL: 0.4000 mg | SUBLINGUAL_TABLET | SUBLINGUAL | 2 refills | Status: DC | PRN

## 2020-09-25 MED ORDER — CARVEDILOL 3.125 MG PO TABS: 3.1250 mg | ORAL_TABLET | Freq: Two times a day (BID) | ORAL | 2 refills | Status: DC

## 2020-09-25 MED ORDER — LOSARTAN POTASSIUM 50 MG PO TABS
100.0000 mg | ORAL_TABLET | Freq: Every day | ORAL | Status: DC
Start: 2020-09-26 — End: 2020-09-25

## 2020-09-25 MED ORDER — TRAMADOL HCL 50 MG PO TABS: 50.0000 mg | ORAL_TABLET | Freq: Four times a day (QID) | ORAL | Status: DC | PRN

## 2020-09-25 MED ORDER — ATORVASTATIN CALCIUM 80 MG PO TABS: 80.0000 mg | ORAL_TABLET | Freq: Every day | ORAL | 2 refills | Status: DC

## 2020-09-25 MED ORDER — LOSARTAN POTASSIUM 100 MG PO TABS: 100.0000 mg | ORAL_TABLET | Freq: Every day | ORAL | 2 refills | Status: DC

## 2020-09-25 MED ORDER — SPIRONOLACTONE 25 MG PO TABS: 12.5000 mg | ORAL_TABLET | Freq: Every day | ORAL | 2 refills | Status: DC

## 2020-09-25 MED ORDER — SACUBITRIL-VALSARTAN 24-26 MG PO TABS: 1.0000 | ORAL_TABLET | Freq: Two times a day (BID) | ORAL | Status: DC

## 2020-09-25 MED ORDER — SPIRONOLACTONE 12.5 MG HALF TABLET: 12.5000 mg | ORAL_TABLET | Freq: Every day | ORAL | Status: DC

## 2020-09-25 MED FILL — LOSARTAN POTASSIUM 100 MG T: 100 | 30 days supply | Qty: 30 | Fill #0

## 2020-09-25 MED FILL — ATORVASTATIN CALCIUM 80 MG: 80 | 30 days supply | Qty: 30 | Fill #0

## 2020-09-25 MED FILL — CARVEDILOL 3.125 MG TABLET: 3.125 | 30 days supply | Qty: 60 | Fill #0

## 2020-09-25 MED FILL — NITROGLYCERIN 0.4 MG TAB SL: 0.4 | 8 days supply | Qty: 25 | Fill #0

## 2020-09-25 MED FILL — JARDIANCE 10 MG TABLET: 10 | 30 days supply | Qty: 30 | Fill #0

## 2020-09-25 MED FILL — FUROSEMIDE 20 MG TAB: 20 | 30 days supply | Qty: 30 | Fill #0

## 2020-09-25 MED FILL — SPIRONOLACTONE 25 MG TABLET: 25 | 30 days supply | Qty: 15 | Fill #0

## 2020-09-25 MED FILL — Verapamil HCl IV Soln 2.5 MG/ML: INTRAVENOUS | Qty: 2 | Status: AC

## 2020-09-25 NOTE — Plan of Care (Signed)
  Problem: Education: Goal: Knowledge of General Education information will improve Description: Including pain rating scale, medication(s)/side effects and non-pharmacologic comfort measures Outcome: Progressing   Problem: Clinical Measurements: Goal: Will remain free from infection Outcome: Progressing Goal: Diagnostic test results will improve Outcome: Progressing Goal: Respiratory complications will improve Outcome: Progressing Goal: Cardiovascular complication will be avoided Outcome: Progressing   Problem: Nutrition: Goal: Adequate nutrition will be maintained Outcome: Progressing   Problem: Pain Managment: Goal: General experience of comfort will improve Outcome: Progressing   Problem: Safety: Goal: Ability to remain free from injury will improve Outcome: Progressing   Problem: Skin Integrity: Goal: Risk for impaired skin integrity will decrease Outcome: Progressing   Problem: Education: Goal: Understanding of cardiac disease, CV risk reduction, and recovery process will improve Outcome: Progressing   Problem: Cardiac: Goal: Ability to achieve and maintain adequate cardiovascular perfusion will improve Outcome: Progressing

## 2020-09-25 NOTE — Discharge Summary (Addendum)
Discharge Summary    Patient ID: Victor Estes MRN: [id number]; DOB: June 07, 1946  Admit date: 09/22/2020 Discharge date: 09/25/2020  PCP:  Kathyrn Lass   [place]  Cardiologist:  Virl Axe, MD  Electrophysiologist:  None   Discharge Diagnoses    Principal Problem:   Acute on chronic combined systolic and diastolic CHF (congestive heart failure) ([place]) Active Problems:   CAD (coronary artery disease)   Demand ischemia ([place])   History of ventricular tachycardia   S/P ICD (internal cardiac defibrillator) procedure   Hypertension   Hyperlipidemia LDL goal <70    Diagnostic Studies/Procedures    Echocardiogram 09/23/2020: Impressions: 1. Left ventricular ejection fraction, by estimation, is 20 to 25%. The  left ventricle has severely decreased function. The left ventricle  demonstrates global hypokinesis. The left ventricular internal cavity size  was mildly dilated. Left ventricular  diastolic parameters are consistent with Grade II diastolic dysfunction  (pseudonormalization).  2. Right ventricular systolic function is moderately reduced. The right  ventricular size is normal. There is mildly elevated pulmonary artery  systolic pressure. The estimated right ventricular systolic pressure is  34.0 mmHg.  3. Left atrial size was severely dilated.  4. Right atrial size was moderately dilated.  5. The mitral valve is normal in structure. Trivial mitral valve  regurgitation. No evidence of mitral stenosis.  6. The aortic valve is tricuspid. Aortic valve regurgitation is trivial.  No aortic stenosis is present.  7. The inferior vena cava is dilated in size with <50% respiratory  variability, suggesting right atrial pressure of 15 mmHg.  _______________  Right/Left Cardiac Catheterization 09/24/2020:  Mid LM to Dist LM lesion is 95% stenosed.  Ost Cx lesion is 95% stenosed.  Dist LM to Ost LAD lesion is 80% stenosed.  Mid LAD lesion is  100% stenosed.  Mid Cx lesion is 95% stenosed.  2nd Mrg lesion is 100% stenosed.  Ost RCA to Prox RCA lesion is 60% stenosed.  Prox RCA to Mid RCA lesion is 85% stenosed.  RPAV lesion is 100% stenosed.  Origin lesion is 100% stenosed.  Diagnostic Dominance: Right      History of Present Illness     Victor Estes is a 75 y.o. male with a history of CAD with remote MI in 1993 and subsequent CABG x4 in 1995 and recent low risk Myoview with no evidence of ischemia on 09/06/2020, ischemic cardiomyopathy, VT in 03/2015 s/p ICD, carotid artery disease, hypertension, hyperlipidemia, GERD, and remote smoking history who was admitted on 09/22/2020 with acute CHF after presenting with progressive shortness of breath and fatigue over the last 4 weeks. Also described orthopnea and difficulty sleeping at night. Denied any chest pain. Recent outpatient Echo on 09/13/2020 showed LVEF of 40-45% with severe akinesis of the basal-mid inferior wall and inferolateral wall. Also had recent Myoview which showed evidence of prior infarct but no ischemia. Symptoms continued to progress so patient was advised to come to the ED.  In the ED: BNP elevated at 2,008. High-sensitivity troponin minimally elevated and flat in the 30's. Chest x-ray showed no acute findings. WBC 12.2, Hgb 13.7, Plts 301. Na 134, K 4.3, Glucose 167, BUN 18, Cr 1.11. Respiratory panel negative for COVID and influenza. Patient was admitted for acute on chronic CHF.  Hospital Course     Consultants: None  Acute on Chronic Combined CHF Ischemic Cardiomyopathy Patient admitted with acute on chronic combined CHF as stated above after presenting with progressive shortness of breath,  orthopnea, and lower extremity edema. BNP elevated at 2,008. Chest x-ray showed no acute findings. Echo this admission showedEcho showedLVEF of 20-25% with global hypokinesis and grade 2 diastolic dysfunction. RV size normal with moderately reduced systolic  function and mildly elevated PASP of 40.8 mmHg. EF down from 40-45% on recent Echo on 09/13/2020.Patient was diuresed with IV Lasix with excellent urinary response and GDMT was optimized. Home Lisinopril was stopped and he was transitioned to ARB. He underwent right/left cardiac catheterization on 09/24/2020 whish showed stable CAD. LVEDP was elevated at 8mmgHg so IV Lasix was continued for additional day. Net negative 5.6L this admission. Discharge weight 174 lbs.  Will discharge patient on Lasix 20mg  PRN for weight gain, Losartan 100mg  daily (new), Coreg 3.125mg  twice daily (decreased from home dose of 6.25mg  twice daily due to low heart rates), Spironolactone 12.5mg  daily (new), and Jardiance 10mg  daily (new). Discussed importance of daily weights and sodium/fluid restrictions.   Of note, initial plan was to start low dose Entresto at discharge; however, systolic BP dropped to the 80's on day of discharge. Patient was asymptomatic with this and BP improved on repeat check  but decision was made to hold off on transitioning to [place]. He will have close follow-up in the [facility] on 10/01/2020. Advised patient to keep a log of BP/HR and bring to this visit. If BP and renal function stable at that time, can switch to [facility].  CAD Demand Ischemia History of remote CABG x4 in 1995.Patient denied any chest pain prior to or during admission. High-sensitivity troponin minimally elevated and flat at 35 >> 33 >> 32. Not consistent with ACS. Consistent with demand ischemia from CHF. Recent Myoview showed evidence of prior infarct but no ischemia. Patient underwent right/left cardiac catheterization which showed stable CAD. Old occluded SVT to distal RCA but patent LIMA to LAD and SVG to OM2. No PCI required. Continue DAPT (Aspirin/Plavix), beta-blocker, high-intensity statin and Zetia.  History of VT s/p ICD Patient did have frequent PVCs and a few short runs of NSVT on telemetry but no  sustained VT during admission. Continue Coreg as above. ICD followed by Dr. Caryl Comes.  Hypertension BP initially elevated but improved with diuresis. Will manage with medications for CHF as above. Home Amlodipine was stopped to allow for optimization of GDMT for CHF.   Hyperlipidemia Lipid panel this admission: Total cholesterol 135, Triglycerides 108, HDL 32, LDL 81. LDL goal <70 given CAD. OnLipitor 40mg  daily and Zetia 10mg  dailyat home. Lipitor increased to 80mg  daily. Will need repeat lipid panel and LFTs in 2 months.  Hypokalemia - Resolved Mild hypokalemia during admission. Nadir 3.3. Supplemented and improved to 3.6 on day of discharge. Started on Spironolactone. Recommend repeat BMET at follow-up visit.  Chronic Pain Patient reports history of chronic pain for which he take Tramadol as needed. He states this is prescribed by the [place]. Reviewed [place] Controlled Substance website per STOP protocol and did not see any Tramadol listed. OK for patient to continue to take this from our standpoint but advised him that we would not be prescribing it which he understood and was OK with.   Patient seen and examined by Dr. Gasper Sells today and determined to be stable for discharge. Outpatient follow-up has been arranged. Medications as below.  Did the patient have an acute coronary syndrome (MI, NSTEMI, STEMI, etc) this admission?:  No.   The elevated Troponin was due to the acute medical illness (demand ischemia).      _____________  Discharge Vitals Blood pressure 119/68, pulse (!) 56, temperature 97.8 F (36.6 C), temperature source Oral, resp. rate 19, height 5\' 8"  (1.727 m), weight 79 kg, SpO2 94 %.  Filed Weights   09/23/20 1046 09/24/20 0441 09/25/20 0500  Weight: 84.7 kg 82.4 kg 79 kg    Labs & Radiologic Studies    CBC Recent Labs    09/22/20 2057 09/23/20 0408 09/24/20 0930 09/24/20 0937  WBC 12.2* 11.1*  --   --   NEUTROABS 10.0*  --   --   --   HGB 13.7 13.3 12.9*   12.9* 12.9*  HCT 42.6 39.6 38.0*  38.0* 38.0*  MCV 90.8 88.2  --   --   PLT 301 271  --   --    Basic Metabolic Panel Recent Labs    09/23/20 1125 09/24/20 0348 09/24/20 0930 09/24/20 0937 09/25/20 0742  NA  --  139   < > 132* 140  K  --  3.4*   < > 3.3* 3.6  CL  --  102  --   --  102  CO2  --  27  --   --  30  GLUCOSE  --  102*  --   --  103*  BUN  --  24*  --   --  21  CREATININE  --  1.21  --   --  1.12  CALCIUM  --  9.0  --   --  9.0  MG 1.9  --   --   --   --    < > = values in this interval not displayed.   Liver Function Tests Recent Labs    09/22/20 2057  AST 28  ALT 32  ALKPHOS 84  BILITOT 1.2  PROT 6.9  ALBUMIN 3.8   No results for input(s): LIPASE, AMYLASE in the last 72 hours. High Sensitivity Troponin:   Recent Labs  Lab 09/22/20 2057 09/22/20 2342 09/23/20 0011  TROPONINIHS 35* 33* 32*    BNP Invalid input(s): POCBNP D-Dimer No results for input(s): DDIMER in the last 72 hours. Hemoglobin A1C No results for input(s): HGBA1C in the last 72 hours. Fasting Lipid Panel Recent Labs    09/24/20 0348  CHOL 135  HDL 32*  LDLCALC 81  TRIG 108  CHOLHDL 4.2   Thyroid Function Tests No results for input(s): TSH, T4TOTAL, T3FREE, THYROIDAB in the last 72 hours.  Invalid input(s): FREET3 _____________  DG Chest 2 View  Result Date: 09/22/2020 CLINICAL DATA:  Dyspnea EXAM: CHEST - 2 VIEW COMPARISON:  04/30/2015 FINDINGS: The heart size and mediastinal contours are within normal limits. Both lungs are clear. The visualized skeletal structures are unremarkable. Unchanged left chest wall AICD and remote median sternotomy. IMPRESSION: No active cardiopulmonary disease. Electronically Signed   By: Ulyses Jarred M.D.   On: 09/22/2020 21:46   CARDIAC CATHETERIZATION  Result Date: 09/24/2020  Mid LM to Dist LM lesion is 95% stenosed.  Ost Cx lesion is 95% stenosed.  Dist LM to Ost LAD lesion is 80% stenosed.  Mid LAD lesion is 100% stenosed.  Mid Cx  lesion is 95% stenosed.  2nd Mrg lesion is 100% stenosed.  Ost RCA to Prox RCA lesion is 60% stenosed.  Prox RCA to Mid RCA lesion is 85% stenosed.  RPAV lesion is 100% stenosed.  Origin lesion is 100% stenosed.  Severe multivessel native CAD with 95% distal left main stenosis, 80% ostial LAD stenosis with total occlusion of the  mid LAD; 95% ostial circumflex stenosis with 95% mid stenosis and occluded OM branch; 60% proximal RCA with diffuse 80 to 85% mid RCA stenoses.  The continuation of the branch of the RCA after the PDA takeoff appears occluded.  There is collateralization to the PL vessel via the circumflex coronary artery. Patent sequential LIMA graft supplying the diagonal vessel in the mid LAD. Patent SVG supplying the large OM 2 vessel of the circumflex coronary artery. Old occlusion of the SVG which had supplied the distal RCA. RECOMMENDATION: Guideline directed medical therapy for HFrEF.  Probable continue long-term DAPT therapy if patient was on Plavix prior to admission.  Aggressive lipid-lowering therapy with target LDL less than 70 and preferably 50 or below.   MYOCARDIAL PERFUSION IMAGING  Result Date: 09/06/2020  There was no ST segment deviation noted during stress.  Defect 1: There is a large defect of severe severity present in the basal inferoseptal, basal inferior, mid inferoseptal, mid inferior and apical inferior location.  Findings consistent with prior myocardial infarction.  Severe fixed defect in inferior/inferoseptal walls consistent with prior infarct. No ischemia noted. This is consistent with prior study. Wall motion unable to be assessed, study not gated due to frequent PVCs (15-20/min).   ECHOCARDIOGRAM COMPLETE  Result Date: 09/23/2020    ECHOCARDIOGRAM REPORT   Patient Name:   Victor Estes Date of Exam: 09/23/2020 Medical Rec #:  [id number]           Height:       68.0 in Accession #:    [id number]          Weight:       190.0 lb Date of Birth:  07-02-1945             BSA:          2.000 m Patient Age:    20 years            BP:           138/105 mmHg Patient Gender: M                   HR:           53 bpm. Exam Location:  Inpatient Procedure: 2D Echo, Cardiac Doppler and Color Doppler Indications:    I50.23 Acute on chronic systolic (congestive) heart failure  History:        Patient has prior history of Echocardiogram examinations, most                 recent 09/13/2020. Previous Myocardial Infarction, Carotid                 Disease; Risk Factors:Hypertension and Dyslipidemia. Cancer.                 GERD.  Sonographer:    Jonelle Sidle Dance Referring Phys: [id number] [place]  1. Left ventricular ejection fraction, by estimation, is 20 to 25%. The left ventricle has severely decreased function. The left ventricle demonstrates global hypokinesis. The left ventricular internal cavity size was mildly dilated. Left ventricular diastolic parameters are consistent with Grade II diastolic dysfunction (pseudonormalization).  2. Right ventricular systolic function is moderately reduced. The right ventricular size is normal. There is mildly elevated pulmonary artery systolic pressure. The estimated right ventricular systolic pressure is 03.5 mmHg.  3. Left atrial size was severely dilated.  4. Right atrial size was moderately dilated.  5. The mitral valve is normal in structure. Trivial mitral  valve regurgitation. No evidence of mitral stenosis.  6. The aortic valve is tricuspid. Aortic valve regurgitation is trivial. No aortic stenosis is present.  7. The inferior vena cava is dilated in size with <50% respiratory variability, suggesting right atrial pressure of 15 mmHg. FINDINGS  Left Ventricle: Left ventricular ejection fraction, by estimation, is 20 to 25%. The left ventricle has severely decreased function. The left ventricle demonstrates global hypokinesis. The left ventricular internal cavity size was mildly dilated. There is no left ventricular hypertrophy.  Left ventricular diastolic parameters are consistent with Grade II diastolic dysfunction (pseudonormalization). Right Ventricle: The right ventricular size is normal. No increase in right ventricular wall thickness. Right ventricular systolic function is moderately reduced. There is mildly elevated pulmonary artery systolic pressure. The tricuspid regurgitant velocity is 2.54 m/s, and with an assumed right atrial pressure of 15 mmHg, the estimated right ventricular systolic pressure is 16.1 mmHg. Left Atrium: Left atrial size was severely dilated. Right Atrium: Right atrial size was moderately dilated. Pericardium: There is no evidence of pericardial effusion. Mitral Valve: The mitral valve is normal in structure. Trivial mitral valve regurgitation. No evidence of mitral valve stenosis. Tricuspid Valve: The tricuspid valve is normal in structure. Tricuspid valve regurgitation is mild. Aortic Valve: The aortic valve is tricuspid. Aortic valve regurgitation is trivial. Aortic regurgitation PHT measures 653 msec. No aortic stenosis is present. Aortic valve mean gradient measures 7.0 mmHg. Aortic valve peak gradient measures 12.0 mmHg. Aortic valve area, by VTI measures 1.38 cm. Pulmonic Valve: The pulmonic valve was normal in structure. Pulmonic valve regurgitation is not visualized. Aorta: The aortic root is normal in size and structure. Venous: The inferior vena cava is dilated in size with less than 50% respiratory variability, suggesting right atrial pressure of 15 mmHg. IAS/Shunts: No atrial level shunt detected by color flow Doppler. Additional Comments: A device lead is visualized in the right ventricle.  LEFT VENTRICLE PLAX 2D LVIDd:         6.30 cm  Diastology LVIDs:         5.20 cm  LV e' medial:    4.57 cm/s LV PW:         1.40 cm  LV E/e' medial:  16.9 LV IVS:        0.80 cm  LV e' lateral:   7.07 cm/s LVOT diam:     2.20 cm  LV E/e' lateral: 10.9 LV SV:         49 LV SV Index:   24 LVOT Area:     3.80 cm   RIGHT VENTRICLE            IVC RV Basal diam:  3.90 cm    IVC diam: 2.60 cm RV Mid diam:    3.30 cm RV S prime:     8.38 cm/s TAPSE (M-mode): 1.6 cm LEFT ATRIUM              Index       RIGHT ATRIUM           Index LA diam:        5.80 cm  2.90 cm/m  RA Area:     27.70 cm LA Vol (A2C):   103.0 ml 51.51 ml/m RA Volume:   97.20 ml  48.61 ml/m LA Vol (A4C):   125.0 ml 62.51 ml/m LA Biplane Vol: 119.0 ml 59.51 ml/m  AORTIC VALVE AV Area (Vmax):    1.36 cm AV Area (Vmean):   1.35 cm AV  Area (VTI):     1.38 cm AV Vmax:           173.00 cm/s AV Vmean:          124.000 cm/s AV VTI:            0.353 m AV Peak Grad:      12.0 mmHg AV Mean Grad:      7.0 mmHg LVOT Vmax:         62.00 cm/s LVOT Vmean:        43.900 cm/s LVOT VTI:          0.128 m LVOT/AV VTI ratio: 0.36 AI PHT:            653 msec  AORTA Ao Root diam: 3.50 cm Ao Asc diam:  3.10 cm MITRAL VALVE               TRICUSPID VALVE MV Area (PHT): 4.23 cm    TR Peak grad:   25.8 mmHg MV Decel Time: 180 msec    TR Vmax:        254.00 cm/s MV E velocity: 77.35 cm/s                            SHUNTS                            Systemic VTI:  0.13 m                            Systemic Diam: 2.20 cm Loralie Champagne MD Electronically signed by Loralie Champagne MD Signature Date/Time: 09/23/2020/2:44:33 PM    Final    ECHOCARDIOGRAM COMPLETE  Result Date: 09/13/2020    ECHOCARDIOGRAM REPORT   Patient Name:   Victor Estes Date of Exam: 09/13/2020 Medical Rec #:  [id number]           Height:       68.0 in Accession #:    [id number]          Weight:       194.0 lb Date of Birth:  11-27-1945            BSA:          2.017 m Patient Age:    80 years            BP:           122/78 mmHg Patient Gender: M                   HR:           72 bpm. Exam Location:  Outpatient Procedure: 2D Echo, Cardiac Doppler, Color Doppler and Intracardiac            Opacification Agent Indications:    R06.00 Shortness of breath  History:        Patient has prior history of Echocardiogram  examinations, most                 recent 04/30/2015. Ischemic cardiomyopathy, CAD and Previous                 Myocardial Infarction, Prior CABG, Arrythmias:VT,                 Signs/Symptoms:Shortness of Breath; Risk Factors:Hypertension.  Nuc Test performed 09/07/20 Defect 1: There is a large defect of                 severe severity present in the basal inferoseptal, basal                 inferior, mid inferoseptal, mid inferior and apical inferior                 location.                 Findings consistent with prior myocardial infarction                 Previous echo revealed LVEF 45% with mild AR.  Sonographer:    Lenard Galloway BA, RDCS Referring Phys: [place]  1. Left ventricular ejection fraction, by estimation, is 40 to 45%. The left ventricle has mildly decreased function. The left ventricle demonstrates regional wall motion abnormalities (see scoring diagram/findings for description). The left ventricular  internal cavity size was moderately dilated. Left ventricular diastolic function could not be evaluated. There is severe akinesis of the left ventricular, basal-mid inferior wall and inferolateral wall.  2. Right ventricular systolic function is low normal. The right ventricular size is mildly enlarged. There is normal pulmonary artery systolic pressure.  3. Left atrial size was moderately dilated.  4. Right atrial size was moderately dilated.  5. The mitral valve is abnormal. Mild mitral valve regurgitation.  6. The aortic valve is tricuspid. Aortic valve regurgitation trivial to mild. Mild aortic valve sclerosis is present, with no evidence of aortic valve stenosis.  7. Mildly dilated pulmonary artery.  8. The inferior vena cava is dilated in size with >50% respiratory variability, suggesting right atrial pressure of 8 mmHg. Comparison(s): No significant change from prior study. 04/30/2015: LVEF 40-45%, mild AR. FINDINGS  Left Ventricle: Left ventricular  ejection fraction, by estimation, is 40 to 45%. The left ventricle has mildly decreased function. The left ventricle demonstrates regional wall motion abnormalities. Severe akinesis of the left ventricular, basal-mid inferior wall and inferolateral wall. Definity contrast agent was given IV to delineate the left ventricular endocardial borders. The left ventricular internal cavity size was moderately dilated. There is no left ventricular hypertrophy. Left ventricular  diastolic function could not be evaluated due to indeterminate diastolic function. Left ventricular diastolic function could not be evaluated. Right Ventricle: The right ventricular size is mildly enlarged. No increase in right ventricular wall thickness. Right ventricular systolic function is low normal. There is normal pulmonary artery systolic pressure. The tricuspid regurgitant velocity is 2.52 m/s, and with an assumed right atrial pressure of 8 mmHg, the estimated right ventricular systolic pressure is 94.8 mmHg. Left Atrium: Left atrial size was moderately dilated. Right Atrium: Right atrial size was moderately dilated. Pericardium: There is no evidence of pericardial effusion. Mitral Valve: The mitral valve is abnormal. There is mild thickening of the mitral valve leaflet(s). Mild mitral valve regurgitation. Tricuspid Valve: The tricuspid valve is grossly normal. Tricuspid valve regurgitation is mild. Aortic Valve: The aortic valve is tricuspid. Aortic valve regurgitation trivial to mild. Aortic regurgitation PHT measures 615 msec. Mild aortic valve sclerosis is present, with no evidence of aortic valve stenosis. Pulmonic Valve: The pulmonic valve was grossly normal. Pulmonic valve regurgitation is trivial. Aorta: The aortic root and ascending aorta are structurally normal, with no evidence of dilitation. Pulmonary Artery: The pulmonary artery is mildly dilated. Venous: The inferior vena cava is dilated in size  with greater than 50% respiratory  variability, suggesting right atrial pressure of 8 mmHg. IAS/Shunts: No atrial level shunt detected by color flow Doppler. Additional Comments: A device lead is visualized.  LEFT VENTRICLE PLAX 2D LVIDd:         6.70 cm LVIDs:         5.30 cm LV PW:         1.20 cm LV IVS:        0.70 cm LVOT diam:     2.30 cm LV SV:         53 LV SV Index:   26 LVOT Area:     4.15 cm  RIGHT VENTRICLE            IVC RV Basal diam:  5.25 cm    IVC diam: 2.30 cm RV Mid diam:    3.60 cm RVSP:           33.4 mmHg LEFT ATRIUM              Index       RIGHT ATRIUM           Index LA diam:        5.00 cm  2.48 cm/m  RA Pressure: 8.00 mmHg LA Vol (A2C):   67.6 ml  33.51 ml/m RA Area:     24.50 cm LA Vol (A4C):   103.0 ml 51.05 ml/m RA Volume:   84.80 ml  42.03 ml/m LA Biplane Vol: 84.2 ml  41.74 ml/m  AORTIC VALVE LVOT Vmax:   62.80 cm/s LVOT Vmean:  44.725 cm/s LVOT VTI:    0.126 m AI PHT:      615 msec  AORTA Ao Root diam: 2.90 cm Ao Asc diam:  3.50 cm TRICUSPID VALVE TR Peak grad:   25.4 mmHg TR Vmax:        252.00 cm/s Estimated RAP:  8.00 mmHg RVSP:           33.4 mmHg  SHUNTS Systemic VTI:  0.13 m Systemic Diam: 2.30 cm Lyman Bishop MD Electronically signed by Lyman Bishop MD Signature Date/Time: 09/13/2020/3:06:55 PM    Final    Disposition   Patient is being discharged home today in good condition.  Follow-up Plans & Appointments     Follow-up Information    Darreld Mclean, PA-C Follow up.   Specialties: Physician Assistant, Cardiology Why: Hospital follow-up with Cardiology scheduled for 10/08/2020 at 10:15am. Please arrive 15 minutes early for check-in. If this date/time does not work for you, please call our office to reschedule. Contact information: [address] [place] [address] [place] [address] [phone]        [place]. Go on 10/01/2020.   Specialty: Cardiology Why: AT 11AM. HEART IMPACT (HV TOC) within [facility] [place]  parking at [employer], off [address] all your medicaitons with you and you daily weight log. Contact information: [address] [id number] [place] [phone]             Discharge Instructions    Diet - low sodium heart healthy   Complete by: As directed    Increase activity slowly   Complete by: As directed       Discharge Medications   Allergies as of 09/25/2020      Reactions   Clarithromycin Swelling      Medication List    STOP taking these medications  amLODipine 5 MG tablet Commonly known as: NORVASC   lisinopril 20 MG tablet Commonly known as: ZESTRIL     TAKE these medications   acetaminophen 325 MG tablet Commonly known as: TYLENOL Take 650 mg by mouth every 6 (six) hours as needed for mild pain, moderate pain, fever or headache.   aspirin 81 MG EC tablet Take 1 tablet (81 mg total) by mouth daily with breakfast.   atorvastatin 80 MG tablet Commonly known as: LIPITOR Take 1 tablet (80 mg total) by mouth at bedtime. What changed:   medication strength  how much to take  additional instructions   Carboxymethylcellulose Sodium 0.25 % Soln Place 1 drop into both eyes in the morning and at bedtime.   carvedilol 3.125 MG tablet Commonly known as: COREG Take 1 tablet (3.125 mg total) by mouth 2 (two) times daily with a meal. What changed:   medication strength  how much to take   Cholecalciferol 50 MCG (2000 UT) Tabs Take 2,000 Units by mouth daily.   clopidogrel 75 MG tablet Commonly known as: PLAVIX TAKE 1 TABLET(75 MG) BY MOUTH DAILY What changed: See the new instructions.   empagliflozin 10 MG Tabs tablet Commonly known as: JARDIANCE Take 1 tablet (10 mg total) by mouth daily. Start taking on: September 26, 2020   ezetimibe 10 MG tablet Commonly known as: ZETIA TAKE 1 TABLET(10 MG) BY MOUTH DAILY   furosemide 20 MG tablet Commonly known as: Lasix Take 1 tablet (20 mg total) by  mouth daily as needed (weight gain (3lbs in 1 day or 5lbs in 1 week)).   losartan 100 MG tablet Commonly known as: Cozaar Take 1 tablet (100 mg total) by mouth daily.   nitroGLYCERIN 0.4 MG SL tablet Commonly known as: Nitrostat Place 1 tablet (0.4 mg total) under the tongue every 5 (five) minutes as needed for chest pain.   pantoprazole 40 MG tablet Commonly known as: PROTONIX Take 40 mg by mouth 2 (two) times daily.   spironolactone 25 MG tablet Commonly known as: Aldactone Take 0.5 tablets (12.5 mg total) by mouth daily.   tamsulosin 0.4 MG Caps capsule Commonly known as: FLOMAX Take 0.4 mg by mouth daily after breakfast.   traMADol 50 MG tablet Commonly known as: ULTRAM Take 1 tablet (50 mg total) by mouth every 6 (six) hours as needed for moderate pain or severe pain.   traZODone 50 MG tablet Commonly known as: DESYREL Take 50 mg by mouth at bedtime.          Outstanding Labs/Studies   Repeat BMET at follow-up visit. Repeat lipid panel and LFTs in 2 weeks after increasing statin.  Duration of Discharge Encounter   Greater than 30 minutes including physician time.  Signed, Darreld Mclean, PA-C 09/25/2020, 12:29 PM   Personally seen and examined. Agree with APP above with the following comments: Briefly 75 yo M with decompensated HF.  On GDMT therapy above his hypotension is asymptomatic without evidence of end organ dysfunction.  Patient aware of red flags for worsening HF.  Goal in outpatient setting would be to transition to [facility] if tolerated.  Rudean Haskell, MD [address] [place], [place] [address] [phone]  4:59 PM

## 2020-09-25 NOTE — Plan of Care (Signed)
  Problem: Education: Goal: Knowledge of General Education information will improve Description: Including pain rating scale, medication(s)/side effects and non-pharmacologic comfort measures Outcome: Adequate for Discharge   Problem: Health Behavior/Discharge Planning: Goal: Ability to manage health-related needs will improve Outcome: Adequate for Discharge   Problem: Clinical Measurements: Goal: Ability to maintain clinical measurements within normal limits will improve Outcome: Adequate for Discharge Goal: Will remain free from infection Outcome: Adequate for Discharge Goal: Diagnostic test results will improve Outcome: Adequate for Discharge Goal: Respiratory complications will improve Outcome: Adequate for Discharge Goal: Cardiovascular complication will be avoided Outcome: Adequate for Discharge   Problem: Activity: Goal: Risk for activity intolerance will decrease Outcome: Adequate for Discharge   Problem: Nutrition: Goal: Adequate nutrition will be maintained Outcome: Adequate for Discharge   Problem: Coping: Goal: Level of anxiety will decrease Outcome: Adequate for Discharge   Problem: Elimination: Goal: Will not experience complications related to bowel motility Outcome: Adequate for Discharge   Problem: Pain Managment: Goal: General experience of comfort will improve Outcome: Adequate for Discharge   Problem: Safety: Goal: Ability to remain free from injury will improve Outcome: Adequate for Discharge   Problem: Skin Integrity: Goal: Risk for impaired skin integrity will decrease Outcome: Adequate for Discharge   Problem: Education: Goal: Ability to demonstrate management of disease process will improve Outcome: Adequate for Discharge Goal: Ability to verbalize understanding of medication therapies will improve Outcome: Adequate for Discharge Goal: Individualized Educational Video(s) Outcome: Adequate for Discharge   Problem: Activity: Goal:  Capacity to carry out activities will improve Outcome: Adequate for Discharge   Problem: Cardiac: Goal: Ability to achieve and maintain adequate cardiopulmonary perfusion will improve Outcome: Adequate for Discharge   Problem: Education: Goal: Understanding of cardiac disease, CV risk reduction, and recovery process will improve Outcome: Adequate for Discharge Goal: Individualized Educational Video(s) Outcome: Adequate for Discharge   Problem: Cardiac: Goal: Ability to achieve and maintain adequate cardiovascular perfusion will improve Outcome: Adequate for Discharge

## 2020-09-25 NOTE — Progress Notes (Signed)
Heart Failure Nurse Navigator Progress Note  [facility] [place] PCP-Cardiologist: Adora Fridge., MD Admission Diagnosis: unstable angina Admitted from: home with spouse  Presentation:   Victor Estes presented with chest pain, dyspnea on exertion and orthopnea. Pleasant man sitting in recliner chair on room air. Had R/L [facility] 3/29.   ECHO/ LVEF: 3/18: 40-45%.  3/29: 20-25%   Clinical Course:  Past Medical History:  Diagnosis Date  . Cancer ([place])   . Carotid artery disease ([place])   . GERD (gastroesophageal reflux disease)   . Hyperlipemia   . Hypertension    Dr Gwenlyn Found  . Myocardial infarct ([place])    CABG-1995- LIMA-D1-LAD, SVG-OM, SVG-RCA   . Ventricular tachycardia, sustained ([place]) 04/27/2015     Social History   Socioeconomic History  . Marital status: Married    Spouse name: Not on file  . Number of children: Not on file  . Years of education: Not on file  . Highest education level: Not on file  Occupational History  . 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 Smoking status: Former Smoker    Quit date: 04/06/1983    Years since quitting: 37.4  . Smokeless tobacco: Never Used  Vaping Use  . Vaping Use: Never used  Substance and Sexual Activity  . Alcohol use: No    Alcohol/week: 0.0 standard drinks  . Drug use: No  . Sexual activity: Not on file  Other Topics Concern  . Not on file  Social History Narrative  . Not on file   Social Determinants of Health   Financial Resource Strain: Not on file  Food Insecurity: Not on file  Transportation Needs: Not on file  Physical Activity: Not on file  Stress: Not on file  Social Connections: Not on file    High Risk Criteria for Readmission and/or Poor Patient Outcomes:  Heart failure hospital admissions (last 6 months): 1   No Show rate: 3%  Difficult social situation: no  Demonstrates medication adherence: yes  Primary Language: English  Literacy level: able to read/write and  comprehend  Education Assessment and Provision:  Detailed education and instructions provided on heart failure disease management including the following:  Signs and symptoms of Heart Failure When to call the physician Importance of daily weights Low sodium diet Fluid restriction Medication management Anticipated future follow-up appointments  Patient education given on each of the above topics.  Patient acknowledges understanding via teach back method and acceptance of all instructions.  Education Materials:  "Living Better With Heart Failure" Booklet, HF zone tool, & Daily Weight Tracker Tool.  Patient has scale at home: yes Patient has pill box at home: yes  Barriers of Care:   -none  Considerations/Referrals:   Referral made to Heart Failure Pharmacist Stewardship: yes, at bedside Referral made to Heart & Vascular TOC clinic: yes, 4/5 @ 11AM  Items for Follow-up on DC/TOC: -medication optimization -fluid intake compliance  Pricilla Holm, RN, BSN Heart Failure Nurse Navigator [phone]

## 2020-09-25 NOTE — Progress Notes (Signed)
  Mobility Specialist Criteria Algorithm Info.  Mobility Team: [facility] elevated:Self regulated Activity: Ambulated in hall (in chair before and after ambulation) Range of motion: Active; All extremities Level of assistance: Independent Assistive device: None Minutes sitting in chair:  Minutes stood: 5 minutes Minutes ambulated: 5 minutes Distance ambulated (ft): 480 ft Mobility response: Tolerated well Bed Position: Chair  Patient agreed to participate in mobility this morning. Per pt he is independent with ADL's, transfers and ambulation. He ambulated in hallway independently 480 feet with fast but steady gait. Tolerated ambulation well without complaint or incident and is now sitting in recliner chair with all needs met.    09/25/2020 3:04 PM

## 2020-09-25 NOTE — Progress Notes (Addendum)
Progress Note  Patient Name: Victor Estes Date of Encounter: 09/25/2020  [facility] Cardiologist: Quay Burow, MD   Subjective   No acute overnight events. Feeling much better. He feels like his breathing is almost back to baseline. No chest pain. No palpitations, lightheadedness, or dizziness. Very eager to go home.  Inpatient Medications    Scheduled Meds: . aspirin EC  81 mg Oral Daily  . atorvastatin  80 mg Oral QHS  . carvedilol  3.125 mg Oral BID WC  . cholecalciferol  2,000 Units Oral Daily  . empagliflozin  10 mg Oral Daily  . enoxaparin (LOVENOX) injection  40 mg Subcutaneous Daily  . ezetimibe  10 mg Oral Daily  . furosemide  40 mg Intravenous BID  . losartan  100 mg Oral Daily  . tamsulosin  0.4 mg Oral QPC breakfast   Continuous Infusions:  PRN Meds: acetaminophen, ondansetron (ZOFRAN) IV, traMADol, zolpidem   Vital Signs    Vitals:   09/25/20 0530 09/25/20 0531 09/25/20 0532 09/25/20 0732  BP:    123/67  Pulse: 64 (!) 52 (!) 48 (!) 59  Resp: 19 18 20 20   Temp:    97.8 F (36.6 C)  TempSrc:    Oral  SpO2: (!) 89% 96% 98% 95%  Weight:      Height:        Intake/Output Summary (Last 24 hours) at 09/25/2020 0906 Last data filed at 09/25/2020 0147 Gross per 24 hour  Intake 751.44 ml  Output 2150 ml  Net -1398.56 ml   Last 3 Weights 09/25/2020 09/24/2020 09/23/2020  Weight (lbs) 174 lb 2.6 oz 181 lb 10.5 oz 186 lb 11.7 oz  Weight (kg) 79 kg 82.4 kg 84.7 kg      Telemetry    Normal sinus rhythm with PVC/PACs. A few couplets and short runs of NSVT noted (3-5 beats). Will have compensatory pause after PVC followed by a few beats of a paced ventricular rhythm. Baseline rates in the 50's to 60's. - Personally Reviewed  ECG    No new ECG tracing today. - Personally Reviewed  Physical Exam   GEN: No acute distress.   Neck: No JVD. Cardiac: RRR. Possible very soft systolic murmur. No rubs or gallops. Radial pulses 2+ and equal. Right  femoral cath site soft with no signs of hematoma. Respiratory: Clear to auscultation bilaterally. No wheezes, rhonchi, or rales. GI: Soft, non-distended, and non-tender. MS: No lower extremity edema. No deformity. Skin: Warm and dry. Neuro:  No focal deficits. Psych: Normal affect. Responds appropriately.  Labs    High Sensitivity Troponin:   Recent Labs  Lab 09/22/20 2057 09/22/20 2342 09/23/20 0011  TROPONINIHS 35* 33* 32*      Chemistry Recent Labs  Lab 09/22/20 2057 09/23/20 0408 09/24/20 0348 09/24/20 0930 09/24/20 0937  NA 134* 135 139 142  142 132*  K 4.3 3.6 3.4* 3.5  3.5 3.3*  CL 101 105 102  --   --   CO2 25 22 27   --   --   GLUCOSE 167* 125* 102*  --   --   BUN 18 17 24*  --   --   CREATININE 1.11 0.98 1.21  --   --   CALCIUM 8.9 8.8* 9.0  --   --   PROT 6.9  --   --   --   --   ALBUMIN 3.8  --   --   --   --   AST  28  --   --   --   --   ALT 32  --   --   --   --   ALKPHOS 84  --   --   --   --   BILITOT 1.2  --   --   --   --   GFRNONAA >60 >60 >60  --   --   ANIONGAP 8 8 10   --   --      Hematology Recent Labs  Lab 09/22/20 2057 09/23/20 0408 09/24/20 0930 09/24/20 0937  WBC 12.2* 11.1*  --   --   RBC 4.69 4.49  --   --   HGB 13.7 13.3 12.9*  12.9* 12.9*  HCT 42.6 39.6 38.0*  38.0* 38.0*  MCV 90.8 88.2  --   --   MCH 29.2 29.6  --   --   MCHC 32.2 33.6  --   --   RDW 14.5 14.4  --   --   PLT 301 271  --   --     BNP Recent Labs  Lab 09/22/20 2057  BNP 2,008.2*     DDimer No results for input(s): DDIMER in the last 168 hours.   Radiology    CARDIAC CATHETERIZATION  Result Date: 09/24/2020  Mid LM to Dist LM lesion is 95% stenosed.  Ost Cx lesion is 95% stenosed.  Dist LM to Ost LAD lesion is 80% stenosed.  Mid LAD lesion is 100% stenosed.  Mid Cx lesion is 95% stenosed.  2nd Mrg lesion is 100% stenosed.  Ost RCA to Prox RCA lesion is 60% stenosed.  Prox RCA to Mid RCA lesion is 85% stenosed.  RPAV lesion is 100%  stenosed.  Origin lesion is 100% stenosed.  Severe multivessel native CAD with 95% distal left main stenosis, 80% ostial LAD stenosis with total occlusion of the mid LAD; 95% ostial circumflex stenosis with 95% mid stenosis and occluded OM branch; 60% proximal RCA with diffuse 80 to 85% mid RCA stenoses.  The continuation of the branch of the RCA after the PDA takeoff appears occluded.  There is collateralization to the PL vessel via the circumflex coronary artery. Patent sequential LIMA graft supplying the diagonal vessel in the mid LAD. Patent SVG supplying the large OM 2 vessel of the circumflex coronary artery. Old occlusion of the SVG which had supplied the distal RCA. RECOMMENDATION: Guideline directed medical therapy for HFrEF.  Probable continue long-term DAPT therapy if patient was on Plavix prior to admission.  Aggressive lipid-lowering therapy with target LDL less than 70 and preferably 50 or below.   ECHOCARDIOGRAM COMPLETE  Result Date: 09/23/2020    ECHOCARDIOGRAM REPORT   Patient Name:   Victor Estes Date of Exam: 09/23/2020 Medical Rec #:  [id number]           Height:       68.0 in Accession #:    [id number]          Weight:       190.0 lb Date of Birth:  [birth year]            BSA:          2.000 m Patient Age:    75 years            BP:           138/105 mmHg Patient Gender: M  HR:           53 bpm. Exam Location:  Inpatient Procedure: 2D Echo, Cardiac Doppler and Color Doppler Indications:    I50.23 Acute on chronic systolic (congestive) heart failure  History:        Patient has prior history of Echocardiogram examinations, most                 recent 09/13/2020. Previous Myocardial Infarction, Carotid                 Disease; Risk Factors:Hypertension and Dyslipidemia. Cancer.                 GERD.  Sonographer:    Jonelle Sidle Dance Referring Phys: [id number] Kemp  1. Left ventricular ejection fraction, by estimation, is 20 to 25%. The left ventricle has  severely decreased function. The left ventricle demonstrates global hypokinesis. The left ventricular internal cavity size was mildly dilated. Left ventricular diastolic parameters are consistent with Grade II diastolic dysfunction (pseudonormalization).  2. Right ventricular systolic function is moderately reduced. The right ventricular size is normal. There is mildly elevated pulmonary artery systolic pressure. The estimated right ventricular systolic pressure is 67.3 mmHg.  3. Left atrial size was severely dilated.  4. Right atrial size was moderately dilated.  5. The mitral valve is normal in structure. Trivial mitral valve regurgitation. No evidence of mitral stenosis.  6. The aortic valve is tricuspid. Aortic valve regurgitation is trivial. No aortic stenosis is present.  7. The inferior vena cava is dilated in size with <50% respiratory variability, suggesting right atrial pressure of 15 mmHg. FINDINGS  Left Ventricle: Left ventricular ejection fraction, by estimation, is 20 to 25%. The left ventricle has severely decreased function. The left ventricle demonstrates global hypokinesis. The left ventricular internal cavity size was mildly dilated. There is no left ventricular hypertrophy. Left ventricular diastolic parameters are consistent with Grade II diastolic dysfunction (pseudonormalization). Right Ventricle: The right ventricular size is normal. No increase in right ventricular wall thickness. Right ventricular systolic function is moderately reduced. There is mildly elevated pulmonary artery systolic pressure. The tricuspid regurgitant velocity is 2.54 m/s, and with an assumed right atrial pressure of 15 mmHg, the estimated right ventricular systolic pressure is 41.9 mmHg. Left Atrium: Left atrial size was severely dilated. Right Atrium: Right atrial size was moderately dilated. Pericardium: There is no evidence of pericardial effusion. Mitral Valve: The mitral valve is normal in structure. Trivial  mitral valve regurgitation. No evidence of mitral valve stenosis. Tricuspid Valve: The tricuspid valve is normal in structure. Tricuspid valve regurgitation is mild. Aortic Valve: The aortic valve is tricuspid. Aortic valve regurgitation is trivial. Aortic regurgitation PHT measures 653 msec. No aortic stenosis is present. Aortic valve mean gradient measures 7.0 mmHg. Aortic valve peak gradient measures 12.0 mmHg. Aortic valve area, by VTI measures 1.38 cm. Pulmonic Valve: The pulmonic valve was normal in structure. Pulmonic valve regurgitation is not visualized. Aorta: The aortic root is normal in size and structure. Venous: The inferior vena cava is dilated in size with less than 50% respiratory variability, suggesting right atrial pressure of 15 mmHg. IAS/Shunts: No atrial level shunt detected by color flow Doppler. Additional Comments: A device lead is visualized in the right ventricle.  LEFT VENTRICLE PLAX 2D LVIDd:         6.30 cm  Diastology LVIDs:         5.20 cm  LV e' medial:    4.57 cm/s LV  PW:         1.40 cm  LV E/e' medial:  16.9 LV IVS:        0.80 cm  LV e' lateral:   7.07 cm/s LVOT diam:     2.20 cm  LV E/e' lateral: 10.9 LV SV:         49 LV SV Index:   24 LVOT Area:     3.80 cm  RIGHT VENTRICLE            IVC RV Basal diam:  3.90 cm    IVC diam: 2.60 cm RV Mid diam:    3.30 cm RV S prime:     8.38 cm/s TAPSE (M-mode): 1.6 cm LEFT ATRIUM              Index       RIGHT ATRIUM           Index LA diam:        5.80 cm  2.90 cm/m  RA Area:     27.70 cm LA Vol (A2C):   103.0 ml 51.51 ml/m RA Volume:   97.20 ml  48.61 ml/m LA Vol (A4C):   125.0 ml 62.51 ml/m LA Biplane Vol: 119.0 ml 59.51 ml/m  AORTIC VALVE AV Area (Vmax):    1.36 cm AV Area (Vmean):   1.35 cm AV Area (VTI):     1.38 cm AV Vmax:           173.00 cm/s AV Vmean:          124.000 cm/s AV VTI:            0.353 m AV Peak Grad:      12.0 mmHg AV Mean Grad:      7.0 mmHg LVOT Vmax:         62.00 cm/s LVOT Vmean:        43.900 cm/s  LVOT VTI:          0.128 m LVOT/AV VTI ratio: 0.36 AI PHT:            653 msec  AORTA Ao Root diam: 3.50 cm Ao Asc diam:  3.10 cm MITRAL VALVE               TRICUSPID VALVE MV Area (PHT): 4.23 cm    TR Peak grad:   25.8 mmHg MV Decel Time: 180 msec    TR Vmax:        254.00 cm/s MV E velocity: 77.35 cm/s                            SHUNTS                            Systemic VTI:  0.13 m                            Systemic Diam: 2.20 cm Loralie Champagne MD Electronically signed by Loralie Champagne MD Signature Date/Time: 09/23/2020/2:44:33 PM    Final     Cardiac Studies   Echocardiogram 09/13/2020: Impressions: 1. Left ventricular ejection fraction, by estimation, is 40 to 45%. The  left ventricle has mildly decreased function. The left ventricle  demonstrates regional wall motion abnormalities (see scoring  diagram/findings for description). The left ventricular  internal cavity size was moderately dilated. Left ventricular diastolic  function could not  be evaluated. There is severe akinesis of the left  ventricular, basal-mid inferior wall and inferolateral wall.  2. Right ventricular systolic function is low normal. The right  ventricular size is mildly enlarged. There is normal pulmonary artery  systolic pressure.  3. Left atrial size was moderately dilated.  4. Right atrial size was moderately dilated.  5. The mitral valve is abnormal. Mild mitral valve regurgitation.  6. The aortic valve is tricuspid. Aortic valve regurgitation trivial to  mild. Mild aortic valve sclerosis is present, with no evidence of aortic  valve stenosis.  7. Mildly dilated pulmonary artery.  8. The inferior vena cava is dilated in size with >50% respiratory  variability, suggesting right atrial pressure of 8 mmHg.   Comparison(s): No significant change from prior study. 04/30/2015: LVEF  40-45%, mild AR.  _______________  Echocardiogram 09/23/2020: Impressions: 1. Left ventricular ejection fraction, by  estimation, is 20 to 25%. The  left ventricle has severely decreased function. The left ventricle  demonstrates global hypokinesis. The left ventricular internal cavity size  was mildly dilated. Left ventricular  diastolic parameters are consistent with Grade II diastolic dysfunction  (pseudonormalization).  2. Right ventricular systolic function is moderately reduced. The right  ventricular size is normal. There is mildly elevated pulmonary artery  systolic pressure. The estimated right ventricular systolic pressure is  71.2 mmHg.  3. Left atrial size was severely dilated.  4. Right atrial size was moderately dilated.  5. The mitral valve is normal in structure. Trivial mitral valve  regurgitation. No evidence of mitral stenosis.  6. The aortic valve is tricuspid. Aortic valve regurgitation is trivial.  No aortic stenosis is present.  7. The inferior vena cava is dilated in size with <50% respiratory  variability, suggesting right atrial pressure of 15 mmHg.  _______________  Right/Left Cardiac Catheterization 09/24/2020:  Mid LM to Dist LM lesion is 95% stenosed.  Ost Cx lesion is 95% stenosed.  Dist LM to Ost LAD lesion is 80% stenosed.  Mid LAD lesion is 100% stenosed.  Mid Cx lesion is 95% stenosed.  2nd Mrg lesion is 100% stenosed.  Ost RCA to Prox RCA lesion is 60% stenosed.  Prox RCA to Mid RCA lesion is 85% stenosed.  RPAV lesion is 100% stenosed.  Origin lesion is 100% stenosed.  Diagnostic Dominance: Right     Patient Profile     75 y.o.malewith a history of CAD with remote MI in 1993 and subsequent CABG x4 in 1995 and recent low risk Myoview with no evidence of ischemia on 09/06/2020, VT in 03/2015 s/p ICD, carotid artery disease, hypertension, hyperlipidemia, GERD, and remote smoking history who was admitted with acute CHF after presenting with worsening dyspnea.  Assessment & Plan    Acute on Chronic Combined CHF - Patient presented with  progressive shortness of breath, orthopnea, and lower extremity edema. - BNP elevated at 2,008. - Chest x-ray showed no acute findings.  - Echo this admission showed Echo showed LVEF of 20-25% with global hypokinesis and grade 2 diastolic dysfunction. RV size normal with moderately reduced systolic function and mildly elevated PASP of 40.8 mmHg. EF down from 40-45% on recent Echo on 09/13/2020.  - LVEDP elevated at 11mmHg on cath yesterday. - Currently on IV Lasix 40mg  twice daily. Documented 2.4 L of urinary output yesterday and net negative 5.6L this admission. Weight down 7lbs  from yesterday and 16 lbs from admission. Today's BMET pending. - Patient appears euvolemic on exam.  - Will stop IV Lasix.  Will discuss what does of PO Lasix to send him home on with MD. - Lisinopril was stopped and patient was started on Losartan 100mg  daily. Will like start Entresto today if renal function stable. - Coreg decreased to 3.125mg  twice daily yesterday due to low resting heart rates. Continues this dose. - Continue Jardiance (started this admission). - Continue to monitor daily weight, strict I/O's, and renal function.  CAD Demand Ischemia - History of remote CABG x4 in 1995. No angina. - EKG does show T wave inversions in V3-V6. - High-sensitivity troponin minimally elevated and flat at 35 >> 33 >> 32. Not consistent with ACS. Consistent with demand ischemia from CHF. - Recent Myoview showed evidence of prior infarct but no ischemia. - R/LHC today showed stable CAD. Old occluded SVT to distal RCA but patent LIMA to LAD and SVG to OM2. No PCI required. - Continue aspirin, beta-blocker, high-intensity statin and Zetia.  VT - History of VT in 2016. S/p ICD. - Telemetry shows frequent PVCs and short runs of NSVT. No sustained ventricular rhythm. Does have compensatory pauses after some PVC followed by a few beats of a paced ventricular rhythm. - Potassium 3.4 yesterday. Repleted. Today's BMET  pending. - Magnesium 1.9 yesterday. - Continue Coreg as above. - Continue to monitor on telemetry. - ICD followed by Dr. Caryl Comes.  Hypertension - BP improved with diuresis. - Lisinopril stopped and Losartan started as above. Plan is to hopefully switch to [facility] tomorrow. - Continue Coreg as above. - Home Amlodipine stopped to allow for maximization of CHF medications.  Hyperlipidemia - Lipid panel this admission: Total cholesterol 135, Triglycerides 108, HDL 32, LDL 81.  - LDL goal <70 given CAD. - On Lipitor 40mg  daily and Zetia 10mg  daily at home. Lipitor increased to 80mg  daily. - Recheck lipid panel and LFTs in 2 months.  For questions or updates, please contact [place] Please consult [email or web address] for contact info under        Signed, Darreld Mclean, PA-C  09/25/2020, 9:06 AM

## 2020-09-25 NOTE — Discharge Instructions (Signed)
Medication Changes:  - STOP Amlodipine. - STOP Lisinopril.  - START Losartan 100mg  once daily. - START Spironolactone 12.5mg  once daily. - START Jardiance 10mg  once daily. - START Lasix 20mg  once daily as needed for 3lb weight gain in 1 day or 5lb weight gain in 1 week. - INCREASE Atorvastatin (Lipitor) to 80mg  once daily.  Please keep a log of your blood pressure and heart rate and bring to follow-up visit.  _______________  Heart Failure Education: 1. Weigh yourself EVERY morning after you go to the bathroom but before you eat or drink anything. Write this number down in a weight log/diary. If you gain 3 pounds overnight or 5 pounds in a week, take Lasix 20mg  as described above. If you have to take Lasix multiple days in a row due to persistent weight gain, please call our office and let us know. 2. Take your medicines as prescribed. If you have concerns about your medications, please call us before you stop taking them.  3. Eat low salt foods--Limit salt (sodium) to 2000 mg per day. This will help prevent your body from holding onto fluid. Read food labels as many processed foods have a lot of sodium, especially canned goods and prepackaged meats. If you would like some assistance choosing low sodium foods, we would be happy to set you up with a nutritionist. 4. Limit all fluids for the day to less than 2 liters (64 ounces). Fluid includes all drinks, coffee, juice, ice chips, soup, jello, and all other liquids. 5. Stay as active as you can everyday. Staying active will give you more energy and make your muscles stronger. Start with 5 minutes at a time and work your way up to 30 minutes a day. Break up your activities--do some in the morning and some in the afternoon. Start with 3 days per week and work your way up to 5 days as you can.  If you have chest pain, feel short of breath, dizzy, or lightheaded, STOP. If you don't feel better after a short rest, call 911. If you do feel better, call  the office to let us know you have symptoms with exercise. _______________  Groin Site Care: Refer to this sheet in the next few weeks. These instructions provide you with information on caring for yourself after your procedure. Your caregiver may also give you more specific instructions. Your treatment has been planned according to current medical practices, but problems sometimes occur. Call your caregiver if you have any problems or questions after your procedure. HOME CARE INSTRUCTIONS  You may shower 24 hours after the procedure. Remove the bandage (dressing) and gently wash the site with plain soap and water. Gently pat the site dry.   Do not apply powder or lotion to the site.   Do not sit in a bathtub, swimming pool, or whirlpool for 5 to 7 days.   No bending, squatting, or lifting anything over 10 pounds (4.5 kg) as directed by your caregiver.   Inspect the site at least twice daily.   Do not drive home if you are discharged the same day of the procedure. Have someone else drive you.   You may drive 24 hours after the procedure unless otherwise instructed by your caregiver.  What to expect:  Any bruising will usually fade within 1 to 2 weeks.   Blood that collects in the tissue (hematoma) may be painful to the touch. It should usually decrease in size and tenderness within 1 to 2 weeks.  SEEK IMMEDIATE MEDICAL CARE IF:  You have unusual pain at the groin site or down the affected leg.   You have redness, warmth, swelling, or pain at the groin site.   You have drainage (other than a small amount of blood on the dressing).   You have chills.   You have a fever or persistent symptoms for more than 72 hours.   You have a fever and your symptoms suddenly get worse.   Your leg becomes pale, cool, tingly, or numb.   You have heavy bleeding from the site. Hold pressure on the site.

## 2020-09-25 NOTE — Progress Notes (Addendum)
Heart Failure Stewardship Pharmacist Progress Note   PCP: Pcp, No PCP-Cardiologist: Quay Burow, MD    HPI:  75 yo M with PMH of CAD s/p CABG, VT s/p ICD, HTN, HLD, GERD, and remote smoking history. Presented to the ED on 09/22/20 with DOE and orthopnea. Last ECHO was done on 09/13/20 and LVEF was 40-45%. Repeat ECHO on 09/24/20 with LVEF of 20-25%. S/p R/LHC on 09/24/20 showed stable CAD, and filling pressures: RA 7, PA 25, wedge 18, CO 4.9, CI 2.5) - recommended probable long term DAPT for CAD and GDMT for HFrEF.  Current HF Medications: Carvedilol 3.125 mg BID Losartan 100 mg daily Jardiance 10 mg daily  Prior to admission HF Medications: Carvedilol 6.25 mg BID Lisinopril 20 mg daily  Pertinent Lab Values: . Serum creatinine 1.12, BUN 21, Potassium 3.6, Sodium 140, BNP 2008.2, Magnesium 1.9   Vital Signs: . Weight: 174 lbs (admission weight: 186 lbs) . Blood pressure: 110-120/60s . Heart rate: 50-60s   Medication Assistance / Insurance Benefits Check: Does the patient have prescription insurance?  Yes Type of insurance plan: [employer] / [facility]  Outpatient Pharmacy:  Prior to admission outpatient pharmacy: [facility] Is the patient willing to use [facility] pharmacy at discharge? Yes Is the patient willing to transition their outpatient pharmacy to utilize a [facility] outpatient pharmacy?   No    Assessment: 1. Acute on chronic systolic CHF (EF 16-10%), due to ICM. NYHA class III symptoms. - Off IV lasix today - Continue carvedilol 3.125 mg BID - Transitioned from lisinopril to losartan 100 mg daily. Consider optimization to [facility] today. Last dose of lisinopril 3/28 ~10am - Continue Jardiance 10 mg daily - Consider starting spironolactone prior to discharge   Plan: 1) Medication changes recommended at this time: - Start Entresto 24/26 mg BID  - Stop losartan   2) Patient assistance: - Jardiance copay $47 per month (free through [facility]) -  Entresto copay $47 per month (free through [facility]) - Appreciate assistance from [employer], CPhT  3)  Education  - Patient has been educated on current HF medications and potential additions to HF medication regimen  - Patient verbalizes understanding that over the next few months, these medication doses may change and more medications may be added to optimize HF regimen - Patient has been educated on basic disease state pathophysiology and goals of therapy - Time spent 30 mins   Kerby Nora, PharmD, BCPS Heart Failure Cytogeneticist Phone [phone]

## 2020-09-26 LAB — CUP PACEART REMOTE DEVICE CHECK
Battery Remaining Longevity: 61 mo
Battery Remaining Percentage: 59 %
Battery Voltage: 2.93 V
Brady Statistic RV Percent Paced: 3.3 %
Date Time Interrogation Session: 20220330172438
HighPow Impedance: 71 Ohm
HighPow Impedance: 71 Ohm
Implantable Lead Implant Date: 20161031
Implantable Lead Location: 753860
Implantable Lead Model: 181
Implantable Lead Serial Number: 333140
Implantable Pulse Generator Implant Date: 20161031
Lead Channel Impedance Value: 330 Ohm
Lead Channel Pacing Threshold Amplitude: 1 V
Lead Channel Pacing Threshold Pulse Width: 0.5 ms
Lead Channel Sensing Intrinsic Amplitude: 5.6 mV
Lead Channel Setting Pacing Amplitude: 2.5 V
Lead Channel Setting Pacing Pulse Width: 0.5 ms
Lead Channel Setting Sensing Sensitivity: 0.5 mV
Pulse Gen Serial Number: 7308468

## 2020-09-30 NOTE — Progress Notes (Signed)
Cardiology Office Note:    Date:  10/08/2020   ID:  Victor Estes, DOB 1946-06-03, MRN [id number]  PCP:  Merryl Hacker, No  Cardiologist:  Quay Burow, MD  Electrophysiologist:  Virl Axe, MD   Referring MD: No ref. provider found   Chief Complaint: hospital follow-up of CHF  History of Present Illness:    Victor Estes is a 75 y.o. male with a history of CAD with remote MI in 1993 and subsequent CABG x4 in 1995 and stable disease on recent cardiac catheterization in 08/2020, ischemic cardiomyopathy with chronic combined CHF and EF of 20-25% on Echo in 08/2020, VT in 03/2015 s/p ICD, carotid artery disease, hypertension, hyperlipidemia, GERD, and remote smoking history who is followed by Dr. Gwenlyn Found and Dr. Caryl Comes and presents today for hospital follow-up of CHF.  Patient recently seen by Dr. Gwenlyn Found in the office for progressive shortness of breath. Myoview and Echo were ordered for further evaluation. Myoview on 09/06/2020 showed evidence of prior infarct but no ischemia. Echo on 09/13/2020 showed LVEF of 40-45% with severe akinesis of the basal-mid inferior wall and inferolateral wall. Symptoms persistent and patient was ultimately advised to go to the ED. He was admitted from 09/22/2020 to 09/25/2020 for acute on chronic combined CHF. BNP was elevated at 2,008. High sensitivity troponin minimally elevated and flat in the low 30's consistent with demand ischemia. He was diuresed with IV Lasix with excellent urinary response. Echo this admission showed LVEF of 20-25% with global hypokinesis and grade 2 diastolic dysfunction. RV size normal with moderately reduced systolic function and mildly elevated PASP of 40.8 mmHg. EF down from 40-45% on recent Echo on 09/13/2020. He underwent right/left cardiac catheterization on 3/29 which showed stable CAD and elevated LVEDP of 19 mmHg. IV Lasix was continued for an additional day. Home Lisinopril and GDMT was optimized.  He was started on Losartan,  Spironolactone, and Jardiance. Home Coreg was decreased due to low heart rates. Lipitor was increased due to LDL of 81. Discharge weight was 174 lbs. Patient was discharge on PRN Lasix. Of note, the initial plan was to transition patient to [facility] prior to discharge; however, systolic BP dropped to the 80's on day of discharge. Patient was asymptomatic with this and BP improve don repeat check so the decision was made to hold off on transitioning to [place].   Patient was seen in the [facility] on 10/01/2020 at which time he was doing well with no shortness of breath. Weights had been stable and he had not needed any Lasix since discharge. Losartan was stopped and he was started on Entresto 24-26mg  twice daily. Renal function and electrolytes were stable at that visit.  Patient presents today for follow-up. He has done very well since discharge. Shorttness of breath has resolved. He was able to play golf the other day without any problems. No orthopnea, PND, or edema. No chest pain, palpitations, lightheadedness, dizziness, or near syncope/syncope. His weights are stable around 174 lbs. He has been compliant with his medications and has not had any issues with them.  Past Medical History:  Diagnosis Date  . Cancer ([place])   . Carotid artery disease ([place])   . GERD (gastroesophageal reflux disease)   . Hyperlipemia   . Hypertension    Dr Gwenlyn Found  . Myocardial infarct ([place])    CABG-1995- LIMA-D1-LAD, SVG-OM, SVG-RCA   . Ventricular tachycardia, sustained ([place]) 04/27/2015    Past Surgical History:  Procedure Laterality Date  . BACK SURGERY    .  CARDIAC CATHETERIZATION N/A 04/27/2015   Severe native dz, LIMA-LAD & SVG-OM OK, SVG-RCA chronically occluded. Left Heart Cath and Coronary Angiography;  Surgeon: Sherren Mocha, MD;  Location: [place] [facility];  Service: Cardiovascular;  Laterality: N/A;  . CHOLECYSTECTOMY    . COLON SURGERY    . CORONARY ARTERY BYPASS GRAFT  1995     LIMA-D1-LAD, SVG-OM, SVG-RCA, Dr Redmond Pulling  . ELBOW SURGERY    . EP IMPLANTABLE DEVICE N/A 04/29/2015   Procedure: ICD Implant;  Surgeon: Deboraha Sprang, MD;  Location: [place] [facility];  Service: Cardiovascular;  Laterality: N/A; St Jude ICD, serial number [id number].  [name] LUMBAR LAMINECTOMY/DECOMPRESSION MICRODISCECTOMY  09/09/2011   Procedure: LUMBAR LAMINECTOMY/DECOMPRESSION MICRODISCECTOMY 1 LEVEL;  Surgeon: Eustace Moore, MD;  Location: [place] [facility];  Service: Neurosurgery;  Laterality: Right;  Right Lumbar Five-Sacral One Hemilaminectomy   . RIGHT/LEFT HEART CATH AND CORONARY/GRAFT ANGIOGRAPHY N/A 09/24/2020   Procedure: RIGHT/LEFT HEART CATH AND CORONARY/GRAFT ANGIOGRAPHY;  Surgeon: Troy Sine, MD;  Location: [place] [facility];  Service: Cardiovascular;  Laterality: N/A;  . TOTAL KNEE ARTHROPLASTY Left 07/26/2013   Procedure: TOTAL KNEE ARTHROPLASTY;  Surgeon: Ninetta Lights, MD;  Location: [place];  Service: Orthopedics;  Laterality: Left;    Current Medications: Current Meds  Medication Sig  . acetaminophen (TYLENOL) 325 MG tablet Take 650 mg by mouth every 6 (six) hours as needed for mild pain, moderate pain, fever or headache.  [name] aspirin 81 MG EC tablet Take 1 tablet (81 mg total) by mouth daily with breakfast.  . atorvastatin (LIPITOR) 80 MG tablet Take 1 tablet (80 mg total) by mouth at bedtime.  . Carboxymethylcellulose Sodium 0.25 % SOLN Place 1 drop into both eyes in the morning and at bedtime.  . carvedilol (COREG) 3.125 MG tablet TAKE 1 TABLET (3.125 MG TOTAL) BY MOUTH 2 (TWO) TIMES DAILY WITH A MEAL.  [name] Cholecalciferol 2000 UNITS TABS Take 2,000 Units by mouth daily.  . clopidogrel (PLAVIX) 75 MG tablet Take 1 tablet (75 mg total) by mouth daily.  . empagliflozin (JARDIANCE) 10 MG TABS tablet Take 1 tablet (10 mg total) by mouth daily.  [name] ezetimibe (ZETIA) 10 MG tablet Take 1 tablet (10 mg total) by mouth daily.  . furosemide (LASIX) 20 MG tablet Take 1 tablet (20 mg total)  by mouth daily as needed (weight gain (3lbs in 1 day or 5lbs in 1 week)).  . nitroGLYCERIN (NITROSTAT) 0.4 MG SL tablet Place 1 tablet (0.4 mg total) under the tongue every 5 (five) minutes as needed for chest pain.  . pantoprazole (PROTONIX) 40 MG tablet Take 40 mg by mouth 2 (two) times daily.  . sacubitril-valsartan (ENTRESTO) 49-51 MG Take 1 tablet by mouth 2 (two) times daily.  [name] spironolactone (ALDACTONE) 25 MG tablet TAKE 1/2 TABLET (12.5 MG TOTAL) BY MOUTH DAILY.  . tamsulosin (FLOMAX) 0.4 MG CAPS capsule Take 0.4 mg by mouth daily after breakfast.  . traMADol (ULTRAM) 50 MG tablet Take 1 tablet (50 mg total) by mouth every 6 (six) hours as needed for moderate pain or severe pain.  . traZODone (DESYREL) 50 MG tablet Take 50 mg by mouth at bedtime.  . [DISCONTINUED] sacubitril-valsartan (ENTRESTO) 24-26 MG Take 1 tablet by mouth 2 (two) times daily.     Allergies:   Clarithromycin   Social History   Socioeconomic History  . Marital status: Married    Spouse name: Veterinary surgeon  . Number of children: 2  . Years  of education: Not on file  . Highest education level: High school graduate  Occupational History  . 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 Smoking status: Former Smoker    Quit date: 04/06/1983    Years since quitting: 37.5  . Smokeless tobacco: Never Used  Vaping Use  . Vaping Use: Never used  Substance and Sexual Activity  . Alcohol use: No    Alcohol/week: 0.0 standard drinks  . Drug use: No  . Sexual activity: Not on file  Other Topics Concern  . Not on file  Social History Narrative  . Not on file   Social Determinants of Health   Financial Resource Strain: Low Risk   . Difficulty of Paying Living Expenses: Not hard at all  Food Insecurity: No Food Insecurity  . Worried About Charity fundraiser in the Last Year: Never true  . Ran Out of Food in the Last Year: Never true  Transportation Needs: No Transportation Needs  . Lack of  Transportation (Medical): No  . Lack of Transportation (Non-Medical): No  Physical Activity: Insufficiently Active  . Days of Exercise per Week: 3 days  . Minutes of Exercise per Session: 30 min  Stress: Not on file  Social Connections: Not on file     Family History: The patient's family history includes Heart failure in his mother; Hypertension in his mother and another family member.  ROS:   Please see the history of present illness.     EKGs/Labs/Other Studies Reviewed:    The following studies were reviewed today:  Carotid Ultrasounds 08/22/2020: Summary:  - Right Carotid: Velocities in the right ICA are consistent with a 60-79% stenosis. Non-hemodynamically significant plaque <50% noted in the CCA.  - Left Carotid: Velocities in the left ICA are consistent with a 1-39% stenosis. Non-hemodynamically significant plaque <50% noted in the CCA.  - Vertebrals: Bilateral vertebral arteries demonstrate antegrade flow.  - Subclavians: Normal flow hemodynamics were seen in bilateral subclavian arteries. _______________  Echocardiogram 09/13/2020: Impressions: 1. Left ventricular ejection fraction, by estimation, is 40 to 45%. The  left ventricle has mildly decreased function. The left ventricle  demonstrates regional wall motion abnormalities (see scoring  diagram/findings for description). The left ventricular  internal cavity size was moderately dilated. Left ventricular diastolic  function could not be evaluated. There is severe akinesis of the left  ventricular, basal-mid inferior wall and inferolateral wall.  2. Right ventricular systolic function is low normal. The right  ventricular size is mildly enlarged. There is normal pulmonary artery  systolic pressure.  3. Left atrial size was moderately dilated.  4. Right atrial size was moderately dilated.  5. The mitral valve is abnormal. Mild mitral valve regurgitation.  6. The aortic valve is tricuspid. Aortic valve  regurgitation trivial to  mild. Mild aortic valve sclerosis is present, with no evidence of aortic  valve stenosis.  7. Mildly dilated pulmonary artery.  8. The inferior vena cava is dilated in size with >50% respiratory  variability, suggesting right atrial pressure of 8 mmHg.   Comparison(s): No significant change from prior study. 04/30/2015: LVEF  40-45%, mild AR.  _______________  Myoview 09/06/2020:  There was no ST segment deviation noted during stress.  Defect 1: There is a large defect of severe severity present in the basal inferoseptal, basal inferior, mid inferoseptal, mid inferior and apical inferior location.  Findings consistent with prior myocardial infarction.   Severe fixed defect in inferior/inferoseptal walls consistent with prior infarct. No  ischemia noted. This is consistent with prior study. Wall motion unable to be assessed, study not gated due to frequent PVCs (15-20/min). _______________  Echocardiogram 09/23/2020: Impressions: 1. Left ventricular ejection fraction, by estimation, is 20 to 25%. The  left ventricle has severely decreased function. The left ventricle  demonstrates global hypokinesis. The left ventricular internal cavity size  was mildly dilated. Left ventricular  diastolic parameters are consistent with Grade II diastolic dysfunction  (pseudonormalization).  2. Right ventricular systolic function is moderately reduced. The right  ventricular size is normal. There is mildly elevated pulmonary artery  systolic pressure. The estimated right ventricular systolic pressure is  54.6 mmHg.  3. Left atrial size was severely dilated.  4. Right atrial size was moderately dilated.  5. The mitral valve is normal in structure. Trivial mitral valve  regurgitation. No evidence of mitral stenosis.  6. The aortic valve is tricuspid. Aortic valve regurgitation is trivial.  No aortic stenosis is present.  7. The inferior vena cava is dilated in size  with <50% respiratory  variability, suggesting right atrial pressure of 15 mmHg.  _______________  Right/Left Cardiac Catheterization 09/24/2020:  Mid LM to Dist LM lesion is 95% stenosed.  Ost Cx lesion is 95% stenosed.  Dist LM to Ost LAD lesion is 80% stenosed.  Mid LAD lesion is 100% stenosed.  Mid Cx lesion is 95% stenosed.  2nd Mrg lesion is 100% stenosed.  Ost RCA to Prox RCA lesion is 60% stenosed.  Prox RCA to Mid RCA lesion is 85% stenosed.  RPAV lesion is 100% stenosed.  Origin lesion is 100% stenosed.  Diagnostic Dominance: Right    EKG:  EKG not ordered today.   Recent Labs: 09/22/2020: ALT 32; B Natriuretic Peptide 2,008.2 09/23/2020: Magnesium 1.9; Platelets 271 09/24/2020: Hemoglobin 12.9 10/01/2020: BUN 13; Creatinine, Ser 1.00; Potassium 4.4; Sodium 138  Recent Lipid Panel    Component Value Date/Time   CHOL 135 09/24/2020 0348   CHOL 110 10/05/2017 0851   TRIG 108 09/24/2020 0348   HDL 32 (L) 09/24/2020 0348   HDL 39 (L) 10/05/2017 0851   CHOLHDL 4.2 09/24/2020 0348   VLDL 22 09/24/2020 0348   LDLCALC 81 09/24/2020 0348   LDLCALC 53 10/05/2017 0851    Physical Exam:    Vital Signs: BP 134/68   Pulse 69   Ht 5\' 8"  (1.727 m)   Wt 174 lb 12.8 oz (79.3 kg)   BMI 26.58 kg/m     Wt Readings from Last 3 Encounters:  10/08/20 174 lb 12.8 oz (79.3 kg)  10/01/20 174 lb 3.2 oz (79 kg)  09/25/20 174 lb 2.6 oz (79 kg)     General: 75 y.o. male in no acute distress. HEENT: Normocephalic and atraumatic. Sclera clear.  Neck: Supple. No JVD. Heart: RRR. Distinct S1 and S2. No significant murmurs, gallops, or rubs. Radial pulses 2+ and equal bilaterally. Right femoral cath site soft with resolving ecchymosis but no hematoma. Lungs: No increased work of breathing. Clear to ausculation bilaterally. No wheezes, rhonchi, or rales.  Abdomen: Soft, non-distended, and non-tender to palpation.  Extremities: No lower extremity edema.    Skin: Warm and  dry. Neuro: Alert and oriented x3. No focal deficits. Psych: Normal affect. Responds appropriately.  Assessment:    1. Chronic combined systolic and diastolic congestive heart failure ([place])   2. Ischemic cardiomyopathy   3. Coronary artery disease involving native coronary artery of native heart without angina pectoris   4. History of ventricular tachycardia  5. S/P ICD (internal cardiac defibrillator) procedure   6. Bilateral carotid artery stenosis   7. Hyperlipidemia LDL goal <70   8. Primary hypertension   9. Medication management     Plan:    Chronic Combined CHF Ischemic Cardiomyopathy - Patient recently admitted with acute on chronic combined CHF.  - Echo during admission showed LVEF of 20-25% with global hypokinesis and grade 2 diastolic dysfunction. RV size normal with moderately reduced systolic function and mildly elevated PASP of 40.8 mmHg. - Appears euvolemic on exam. - Continue Lasix 20mg  as needed for weight gain.  - Will increase Entresto to 49-51mg  twice daily. - Continue Coreg 3.125mg  twice daily. - Continue Spironolactone 12.5mg  daily. - Continue Jardiance 10mg  daily.  - Continue daily weights importance of daily weights and sodium/fluid restrictions.  - Will repeat BMET today and then have patient come back for PharmD visit in 1-2 weeks for further optimization of CHF if able. - Repeat Echo in 3 months after optimization of GDMT to reassess LV function.  Will fax updated medication list to [facility] per patient's request.  CAD - History of CABG x4 in 1995. Recent LHC during admission showed stable CAD with old occluded SVG to distal RCA but patent LIMA to LAD and SVG to OM2. No PCI required. - No angina. - Continue DAPT with Aspirin and DAPT.  - Continue beta-blocker and high-intensity statin.  History of VT s/p ICD - Continue Coreg as above. - ICD followed by Dr. Caryl Comes.  Bilateral Carotid Stenosis - Carotid ultrasound in 07/2020 showed 60-79% stenosis of  right ICA and 1-39% stenosis of left ICA. - Continue DAPT for CAD as above. - Plan is for repeat dopplers in 07/2021.  Hypertension - BP well controlled at 134/68 today. - Continue medications for CHF as above.  Hyperlipidemia - Lipid panel during recent admission: Total cholesterol 135, Triglycerides 108, HDL 32, LDL 81. - LDL goal <70 given CAD.   - Lipitor increased to 80mg  daily during admission.  - Continue Zetia 10mg  dialy.  - Will need to repeat lipid panel and LFTS in 2-3 months.  Disposition: Follow up with Dr. Gwenlyn Found in 3 months after repeat Echo.   Medication Adjustments/Labs and Tests Ordered: Current medicines are reviewed at length with the patient today.  Concerns regarding medicines are outlined above.  Orders Placed This Encounter  Procedures  . Hepatic function panel  . Lipid panel  . Basic metabolic panel  . ECHOCARDIOGRAM COMPLETE   Meds ordered this encounter  Medications  . sacubitril-valsartan (ENTRESTO) 49-51 MG    Sig: Take 1 tablet by mouth 2 (two) times daily.    Dispense:  60 tablet    Refill:  1    Patient Instructions  Medication Instructions:   INCREASE Entresto to 49-51 mg 1 tablet 2 times a day  *If you need a refill on your cardiac medications before your next appointment, please call your pharmacy*   Lab Work: Your physician recommends that you return for lab work TODAY:   BMET  Your physician recommends that you return for lab work in 2 months:   Fasting Lipid Panel-DO NOT EAT OR DRINK PAST MIDNIGHT. OKAY TO HAVE WATER.  Hepatic (Liver) Function Test  If you have labs (blood work) drawn today and your tests are completely normal, you will receive your results only by: [name] MyChart Message (if you have MyChart) OR . A paper copy in the mail If you have any lab test that is abnormal or we  need to change your treatment, we will call you to review the results.   Testing/Procedures: Your physician has requested that you have an  echocardiogram. Echocardiography is a painless test that uses sound waves to create images of your heart. It provides your doctor with information about the size and shape of your heart and how well your heart's chambers and valves are working. This procedure takes approximately one hour. There are no restrictions for this procedure.   Please schedule for 3 months    Follow-Up: At [facility], you and your health needs are our priority.  As part of our continuing mission to provide you with exceptional heart care, we have created designated Provider Care Teams.  These Care Teams include your primary Cardiologist (physician) and Advanced Practice Providers (APPs -  Physician Assistants and Nurse Practitioners) who all work together to provide you with the care you need, when you need it.  We recommend signing up for the patient portal called "MyChart".  Sign up information is provided on this After Visit Summary.  MyChart is used to connect with patients for Virtual Visits (Telemedicine).  Patients are able to view lab/test results, encounter notes, upcoming appointments, etc.  Non-urgent messages can be sent to your provider as well.   To learn more about what you can do with MyChart, go to [email or web address].    Your next appointment:   1 week (s)  3 month(s) after Echocardiogram   The format for your next appointment:   In Person  In Person  Provider:   Pharm D Quay Burow, MD  Other Instructions      Signed, Darreld Mclean, PA-C  10/08/2020 1:05 PM    [place]

## 2020-10-01 ENCOUNTER — Ambulatory Visit (HOSPITAL_COMMUNITY)
Admit: 2020-10-01 | Discharge: 2020-10-01 | Disposition: A | Discharge status: Home / Self Care | Payer: Medicare Other | Attending: Internal Medicine | Admitting: Internal Medicine

## 2020-10-01 ENCOUNTER — Encounter (HOSPITAL_COMMUNITY): Payer: Self-pay

## 2020-10-01 ENCOUNTER — Other Ambulatory Visit: Payer: Self-pay

## 2020-10-01 VITALS — BP 110/60 | HR 63 | Wt 174.2 lb

## 2020-10-01 DIAGNOSIS — Z7902 Long term (current) use of antithrombotics/antiplatelets: Secondary | ICD-10-CM | POA: Diagnosis not present

## 2020-10-01 DIAGNOSIS — Z8249 Family history of ischemic heart disease and other diseases of the circulatory system: Secondary | ICD-10-CM | POA: Insufficient documentation

## 2020-10-01 DIAGNOSIS — I472 Ventricular tachycardia, unspecified: Secondary | ICD-10-CM

## 2020-10-01 DIAGNOSIS — I255 Ischemic cardiomyopathy: Secondary | ICD-10-CM

## 2020-10-01 DIAGNOSIS — I252 Old myocardial infarction: Secondary | ICD-10-CM | POA: Insufficient documentation

## 2020-10-01 DIAGNOSIS — I110 Hypertensive heart disease with heart failure: Secondary | ICD-10-CM | POA: Diagnosis not present

## 2020-10-01 DIAGNOSIS — Z7982 Long term (current) use of aspirin: Secondary | ICD-10-CM | POA: Insufficient documentation

## 2020-10-01 DIAGNOSIS — Z95810 Presence of automatic (implantable) cardiac defibrillator: Secondary | ICD-10-CM | POA: Diagnosis not present

## 2020-10-01 DIAGNOSIS — E785 Hyperlipidemia, unspecified: Secondary | ICD-10-CM | POA: Diagnosis not present

## 2020-10-01 DIAGNOSIS — Z951 Presence of aortocoronary bypass graft: Secondary | ICD-10-CM | POA: Diagnosis not present

## 2020-10-01 DIAGNOSIS — I5042 Chronic combined systolic (congestive) and diastolic (congestive) heart failure: Secondary | ICD-10-CM | POA: Insufficient documentation

## 2020-10-01 DIAGNOSIS — Z79899 Other long term (current) drug therapy: Secondary | ICD-10-CM | POA: Insufficient documentation

## 2020-10-01 DIAGNOSIS — I2510 Atherosclerotic heart disease of native coronary artery without angina pectoris: Secondary | ICD-10-CM | POA: Insufficient documentation

## 2020-10-01 DIAGNOSIS — I6523 Occlusion and stenosis of bilateral carotid arteries: Secondary | ICD-10-CM | POA: Insufficient documentation

## 2020-10-01 DIAGNOSIS — Z87891 Personal history of nicotine dependence: Secondary | ICD-10-CM | POA: Insufficient documentation

## 2020-10-01 LAB — BASIC METABOLIC PANEL
Anion gap: 7 (ref 5–15)
BUN: 13 mg/dL (ref 8–23)
CO2: 28 mmol/L (ref 22–32)
Calcium: 9.8 mg/dL (ref 8.9–10.3)
Chloride: 103 mmol/L (ref 98–111)
Creatinine, Ser: 1 mg/dL (ref 0.61–1.24)
GFR, Estimated: >60 mL/min (ref >60)
Glucose, Bld: 186 mg/dL — ABNORMAL HIGH (ref 70–99)
Potassium: 4.4 mmol/L (ref 3.5–5.1)
Sodium: 138 mmol/L (ref 135–145)

## 2020-10-01 MED ORDER — ENTRESTO 24-26 MG PO TABS: 1.0000 | ORAL_TABLET | Freq: Two times a day (BID) | ORAL | 1 refills | Status: DC

## 2020-10-01 NOTE — Progress Notes (Signed)
[facility] Transitions of Care Clinic Heart Failure Pharmacist Encounter  HPI:  75 yo M with PMH of CAD s/p CABG, VT s/p ICD, HTN, HLD, GERD, and remote smoking history. Presented to the ED on 09/22/20 with DOE and orthopnea. Last ECHO was done on 09/13/20 and LVEF was 40-45%. Repeat ECHO on 09/24/20 with LVEF of 20-25%. S/p R/LHC on 09/24/20 showed stable CAD, and filling pressures: RA 7, PA 25, wedge 18, CO 4.9, CI 2.5) - recommended probable long term DAPT for CAD and GDMT for HFrEF. He was then discharged on 09/25/20.  Today, Victor Estes presents to the [facility] for follow up. He denies having shortness of breath, DOE, edema, lightheadedness or dizziness. He has been following a low sodium and fluid restricted diet at home. He has been taking all medications as prescribed.     HF Medications: Furosemide 20 mg daily PRN Carvedilol 3.125 mg BID Losartan 100 mg daily Spironolactone 12.5 mg daily Jardiance 10 mg daily  Has the patient been experiencing any side effects to the medications prescribed?  no  Does the patient have any problems obtaining medications due to transportation or finances?   no  Understanding of regimen: good Understanding of indications: good Potential of compliance: good Patient understands to avoid NSAIDs. Patient understands to avoid decongestants.   Pertinent Lab Values: . Serum creatinine 1.12, BUN 21, Potassium 3.6, Sodium 140, BNP 2008.2, Magnesium 1.9   Vital Signs: . Weight: 174 lbs (discharge weight: 174 lbs) . Blood pressure: 110/60 mmHg  . Heart rate: 63 bpm   Medication Assistance / Insurance Benefits Check: Does the patient have prescription insurance?  Yes Type of insurance plan: [employer] / [facility]  Outpatient Pharmacy:  Current outpatient pharmacy: [id number] Was the [facility] pharmacy used to supply discharge medications? yes  If [facility] pharmacy was used, were the refills transferred out  to current pharmacy yet? no  Is the patient willing to transition their outpatient pharmacy to utilize a [facility] outpatient pharmacy with or without mail order?   No  Assessment: 1) Chronic systolic CHF (EF 29-92%), due to ICM. NYHA class II symptoms. - Continue furosemide 20 mg daily PRN - Continue carvedilol 3.125 mg BID - Stop losartan and switch to Entresto 24/26 mg BID - Continue spironolactone 12.5 mg daily. BMET today. - Continue Jardiance 10 mg daily  Plan: 1) Medication changes: - Stop losartan 100 mg daily - Start Entresto 24/26 mg BID  2) Patient Assistance: Delene Loll prescription sent to [facility] today (provided free 30-day Entresto copay card) Delene Loll prescription and all new prescriptions at discharge will be sent to [facility] in [place] for future fills  3) Follow up: - Next appointment with [facility] on 10/08/20  Kerby Nora, PharmD, BCPS Heart Failure Transitions of Care Clinic Pharmacist [phone]

## 2020-10-01 NOTE — Patient Instructions (Signed)
STOP Losartan START Entresto 24/26 mg, one tab twice a day  Labs today We will only contact you if something comes back abnormal or we need to make some changes. Otherwise no news is good news!  Keep follow up as scheduled with [facility]  At the [facility], you and your health needs are our priority. As part of our continuing mission to provide you with exceptional heart care, we have created designated Provider Care Teams. These Care Teams include your primary Cardiologist (physician) and Advanced Practice Providers (APPs- Physician Assistants and Nurse Practitioners) who all work together to provide you with the care you need, when you need it.   You may see any of the following providers on your designated Care Team at your next follow up: [name] Dr Glori Bickers . Dr Loralie Champagne . Dr Vickki Muff . Darrick Grinder, NP . Lyda Jester, [place] . Audry Riles, PharmD   Please be sure to bring in all your medications bottles to every appointment.

## 2020-10-01 NOTE — Progress Notes (Signed)
[facility]  PCP: none Primary Cardiologist: Barrie Folk  HPI:  Victor Estes is a 75 y.o.  male  with a PMH significant for CAD with remote MI in 1993 and subsequent CABG x4 in 1995 and recent low risk Myoview with no evidence of ischemia on 09/06/2020,ischemic cardiomyopathy with chronic combined CHF and EF of 20-25% on Echo in 08/2020,VT in 03/2015 s/p ICD, carotid artery disease, hypertension, hyperlipidemia, GERD, and remote smoking history.  Known longstanding CAD and ischemic cardiomyopathy. MI 1993 then Underwent CABG to LIMA-D1-LAD, SVG-OM, SVG-RCA in 1995. Has had  EF 40-45% dating back to at least 2014.  Developed Vtach in 2016 and ICD was placed.  Mostly managed in the outpatient setting until he began developing worsening shortness of breath, orthopnea.  Seen by his cardiologist and an echo was ordered which shoed EF remained 40-45% 09/13/20 and myoview which shoed stable chronic ischemia.  Symptoms continued to worsen and patient advised to go to the ED.  He was found to be volume overloaded with flat troponin.  Had L/RHC:  Severe multivessel native CAD with 95% distal left main stenosis, 80% ostial LAD stenosis with total occlusion of the mid LAD; 95% ostial circumflex stenosis with 95% mid stenosis and occluded OM branch; 60% proximal RCA with diffuse 80 to 85% mid RCA stenoses.  The continuation of the branch of the RCA after the PDA takeoff appears occluded.  There is collateralization to the PL vessel via the circumflex coronary artery.  Patent sequential LIMA graft supplying the diagonal vessel in the mid LAD.  Patent SVG supplying the large OM 2 vessel of the circumflex coronary artery.  Old occlusion of the SVG which had supplied the distal RCA.  RECOMMENDATION: Guideline directed medical therapy for HFrEF.  Probable continue long-term DAPT therapy if patient was on Plavix prior to admission.  Aggressive  lipid-lowering therapy with target LDL less than 70 and preferably 50 or below  Since leaving the hospital not short of breath, sleeping better.  Yesterday went to work at his [employer], mainly handing people tools and supervising.  Walked around the jobsite all day and mowed the lawn yesterday.  No chest pain episodes.  Weight at home stable at 178 hasn't needed any lasix.  Discharge weight 174 lbs and 174lbs here today    ROS: All systems negative except as listed in HPI, PMH and Problem List.  SH:  Social History   Socioeconomic History  . Marital status: Married    Spouse name: Veterinary surgeon  . Number of children: 2  . Years of education: Not on file  . Highest education level: High school graduate  Occupational History  . 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 Smoking status: Former Smoker    Quit date: 04/06/1983    Years since quitting: 37.5  . Smokeless tobacco: Never Used  Vaping Use  . Vaping Use: Never used  Substance and Sexual Activity  . Alcohol use: No    Alcohol/week: 0.0 standard drinks  . Drug use: No  . Sexual activity: Not on file  Other Topics Concern  . Not on file  Social History Narrative  . Not on file   Social Determinants of Health   Financial Resource Strain: Low Risk   . Difficulty of Paying Living Expenses: Not hard at all  Food Insecurity: No Food Insecurity  . Worried About Charity fundraiser in the Last Year: Never  true  . Ran Out of Food in the Last Year: Never true  Transportation Needs: No Transportation Needs  . Lack of Transportation (Medical): No  . Lack of Transportation (Non-Medical): No  Physical Activity: Insufficiently Active  . Days of Exercise per Week: 3 days  . Minutes of Exercise per Session: 30 min  Stress: Not on file  Social Connections: Not on file  Intimate Partner Violence: Not on file    FH:  Family History  Problem Relation Age of Onset  . Heart failure Mother   . Hypertension  Mother   . Hypertension Other     Past Medical History:  Diagnosis Date  . Cancer ([place])   . Carotid artery disease ([place])   . GERD (gastroesophageal reflux disease)   . Hyperlipemia   . Hypertension    Dr Gwenlyn Found  . Myocardial infarct ([place])    CABG-1995- LIMA-D1-LAD, SVG-OM, SVG-RCA   . Ventricular tachycardia, sustained ([place]) 04/27/2015    Current Outpatient Medications  Medication Sig Dispense Refill  . acetaminophen (TYLENOL) 325 MG tablet Take 650 mg by mouth every 6 (six) hours as needed for mild pain, moderate pain, fever or headache.    [name] aspirin EC 81 MG EC tablet Take 1 tablet (81 mg total) by mouth daily with breakfast.    . atorvastatin (LIPITOR) 80 MG tablet Take 1 tablet (80 mg total) by mouth at bedtime. 30 tablet 2  . Carboxymethylcellulose Sodium 0.25 % SOLN Place 1 drop into both eyes in the morning and at bedtime.    . carvedilol (COREG) 3.125 MG tablet TAKE 1 TABLET (3.125 MG TOTAL) BY MOUTH 2 (TWO) TIMES DAILY WITH A MEAL. 60 tablet 2  . Cholecalciferol 2000 UNITS TABS Take 2,000 Units by mouth daily.    . clopidogrel (PLAVIX) 75 MG tablet TAKE 1 TABLET(75 MG) BY MOUTH DAILY (Patient taking differently: Take 75 mg by mouth daily.) 90 tablet 0  . empagliflozin (JARDIANCE) 10 MG TABS tablet Take 1 tablet (10 mg total) by mouth daily. 30 tablet 2  . ezetimibe (ZETIA) 10 MG tablet TAKE 1 TABLET(10 MG) BY MOUTH DAILY (Patient taking differently: Take 10 mg by mouth daily.) 90 tablet 3  . furosemide (LASIX) 20 MG tablet Take 1 tablet (20 mg total) by mouth daily as needed (weight gain (3lbs in 1 day or 5lbs in 1 week)). 30 tablet 1  . nitroGLYCERIN (NITROSTAT) 0.4 MG SL tablet Place 1 tablet (0.4 mg total) under the tongue every 5 (five) minutes as needed for chest pain. 25 tablet 2  . pantoprazole (PROTONIX) 40 MG tablet Take 40 mg by mouth 2 (two) times daily.    . sacubitril-valsartan (ENTRESTO) 24-26 MG Take 1 tablet by mouth 2 (two) times daily. 60 tablet 1  .  spironolactone (ALDACTONE) 25 MG tablet TAKE 1/2 TABLET (12.5 MG TOTAL) BY MOUTH DAILY. 15 tablet 2  . tamsulosin (FLOMAX) 0.4 MG CAPS capsule Take 0.4 mg by mouth daily after breakfast.    . traMADol (ULTRAM) 50 MG tablet Take 1 tablet (50 mg total) by mouth every 6 (six) hours as needed for moderate pain or severe pain. 30 tablet   . traZODone (DESYREL) 50 MG tablet Take 50 mg by mouth at bedtime.     No current facility-administered medications for this encounter.    Vitals:   10/01/20 1057  BP: 110/60  Pulse: 63  SpO2: 95%  Weight: 79 kg (174 lb 3.2 oz)    PHYSICAL EXAM: Cardiac: JVD flat, normal  rate and rhythm, clear s1 and s2, no murmurs, rubs or gallops, no LE edema Pulmonary: CTAB, not in distress Abdominal: non distended abdomen, soft and nontender Psych: Alert, conversant, in good spirits   ASSESSMENT & PLAN:   Chronic Combined Systolic and Diastolic CHF;Biventricular CHF, Ischemic Cardiomyopathy - Prior ECHO from 2014 up to 09/13/2020 EF 40-45% - Admission ECHO 08/2020 LVEF of 20-25%  and grade 2 diastolic dysfunction. RV size normal with moderately reduced systolic function and mildly elevated PASP of 40.8 mmHg. - History of CABG x4 in 1995. 08/2020 Recent LHC during admission showed stable CAD with old occluded SVG to distal RCA but patent LIMA to LAD and SVG to OM2. No PCI required -Interrogated his ICD device today he has no arrhythmias, very minimal RV pacing 3% - NYHA Class II, Appears euvolemic on exam - Continue Lasix 20mg  as needed for weight gain.  - Continue Coreg 3.125mg  twice daily, Spironolactone 12.5mg , Jardiance 10mg . - Switch losartan to entresto start at low dose - Continue DAPT with Aspirin and Plavix.  - Continue beta-blocker and high-intensity statin. - He is well compensated today, I am hopeful his EF will recover with aggressive medical management, was only on coreg and lisinopril prior to admission, his options for advanced therapies are limited  by his age and RV dysfunction   History of VT s/p ICD - Continue Coreg as above. - Interrogated his device today he has no arrhythmias, very minimal RV pacing 3% - ICD followed by Dr. Caryl Comes.  Bilateral Carotid Stenosis - Carotid ultrasound in 07/2020 showed 60-79% stenosis of right ICA and 1-39% stenosis of left ICA. - Continue DAPT/statin as above. - Plan is for repeat dopplers in 07/2021.   Follow up with general cardiology

## 2020-10-02 ENCOUNTER — Other Ambulatory Visit (HOSPITAL_COMMUNITY): Payer: Self-pay | Admitting: Cardiology

## 2020-10-02 MED ORDER — ASPIRIN 81 MG PO TBEC: 81.0000 mg | DELAYED_RELEASE_TABLET | Freq: Every day | ORAL | 11 refills | Status: AC

## 2020-10-02 MED ORDER — ENTRESTO 24-26 MG PO TABS: 1.0000 | ORAL_TABLET | Freq: Two times a day (BID) | ORAL | 11 refills | Status: DC

## 2020-10-02 MED ORDER — ATORVASTATIN CALCIUM 80 MG PO TABS: 80.0000 mg | ORAL_TABLET | Freq: Every day | ORAL | 11 refills | Status: DC

## 2020-10-02 MED ORDER — SPIRONOLACTONE 25 MG PO TABS: 0.5000 | ORAL_TABLET | Freq: Every day | ORAL | 11 refills | Status: DC

## 2020-10-02 MED ORDER — CARVEDILOL 3.125 MG PO TABS: ORAL_TABLET | Freq: Two times a day (BID) | ORAL | 11 refills | Status: DC

## 2020-10-02 MED ORDER — FUROSEMIDE 20 MG PO TABS: 20.0000 mg | ORAL_TABLET | Freq: Every day | ORAL | 11 refills | Status: DC | PRN

## 2020-10-02 MED ORDER — CLOPIDOGREL BISULFATE 75 MG PO TABS: 75.0000 mg | ORAL_TABLET | Freq: Every day | ORAL | 11 refills | Status: DC

## 2020-10-02 MED ORDER — EMPAGLIFLOZIN 10 MG PO TABS: 10.0000 mg | ORAL_TABLET | Freq: Every day | ORAL | 11 refills | Status: DC

## 2020-10-02 MED ORDER — EZETIMIBE 10 MG PO TABS: 10.0000 mg | ORAL_TABLET | Freq: Every day | ORAL | 11 refills | Status: DC

## 2020-10-02 NOTE — Telephone Encounter (Signed)
All cardiac medications printed and sent to [facility] via epic routing

## 2020-10-07 NOTE — Progress Notes (Signed)
Remote ICD transmission.   

## 2020-10-08 ENCOUNTER — Ambulatory Visit (INDEPENDENT_AMBULATORY_CARE_PROVIDER_SITE_OTHER): Payer: Medicare Other | Admitting: Student

## 2020-10-08 ENCOUNTER — Encounter: Payer: Self-pay | Admitting: Student

## 2020-10-08 ENCOUNTER — Other Ambulatory Visit: Payer: Self-pay

## 2020-10-08 VITALS — BP 134/68 | HR 69 | Ht 68.0 in | Wt 174.8 lb

## 2020-10-08 DIAGNOSIS — I255 Ischemic cardiomyopathy: Secondary | ICD-10-CM | POA: Diagnosis not present

## 2020-10-08 DIAGNOSIS — I10 Essential (primary) hypertension: Secondary | ICD-10-CM | POA: Diagnosis not present

## 2020-10-08 DIAGNOSIS — I6523 Occlusion and stenosis of bilateral carotid arteries: Secondary | ICD-10-CM

## 2020-10-08 DIAGNOSIS — I2510 Atherosclerotic heart disease of native coronary artery without angina pectoris: Secondary | ICD-10-CM | POA: Diagnosis not present

## 2020-10-08 DIAGNOSIS — Z79899 Other long term (current) drug therapy: Secondary | ICD-10-CM

## 2020-10-08 DIAGNOSIS — Z8679 Personal history of other diseases of the circulatory system: Secondary | ICD-10-CM

## 2020-10-08 DIAGNOSIS — I5042 Chronic combined systolic (congestive) and diastolic (congestive) heart failure: Secondary | ICD-10-CM | POA: Diagnosis not present

## 2020-10-08 DIAGNOSIS — E785 Hyperlipidemia, unspecified: Secondary | ICD-10-CM

## 2020-10-08 DIAGNOSIS — Z95810 Presence of automatic (implantable) cardiac defibrillator: Secondary | ICD-10-CM | POA: Diagnosis not present

## 2020-10-08 LAB — BASIC METABOLIC PANEL
BUN/Creatinine Ratio: 19 (ref 10–24)
BUN: 20 mg/dL (ref 8–27)
CO2: 22 mmol/L (ref 20–29)
Calcium: 9.8 mg/dL (ref 8.6–10.2)
Chloride: 100 mmol/L (ref 96–106)
Creatinine, Ser: 1.05 mg/dL (ref 0.76–1.27)
Glucose: 158 mg/dL — ABNORMAL HIGH (ref 65–99)
Potassium: 4.8 mmol/L (ref 3.5–5.2)
Sodium: 136 mmol/L (ref 134–144)
eGFR: 74 mL/min/{1.73_m2} (ref >59)

## 2020-10-08 MED ORDER — ENTRESTO 49-51 MG PO TABS: 1.0000 | ORAL_TABLET | Freq: Two times a day (BID) | ORAL | 1 refills | Status: DC

## 2020-10-08 NOTE — Patient Instructions (Signed)
Medication Instructions:   INCREASE Entresto to 49-51 mg 1 tablet 2 times a day  *If you need a refill on your cardiac medications before your next appointment, please call your pharmacy*   Lab Work: Your physician recommends that you return for lab work TODAY:   BMET  Your physician recommends that you return for lab work in 2 months:   Fasting Lipid Panel-DO NOT EAT OR DRINK PAST MIDNIGHT. OKAY TO HAVE WATER.  Hepatic (Liver) Function Test  If you have labs (blood work) drawn today and your tests are completely normal, you will receive your results only by: [name] MyChart Message (if you have MyChart) OR . A paper copy in the mail If you have any lab test that is abnormal or we need to change your treatment, we will call you to review the results.   Testing/Procedures: Your physician has requested that you have an echocardiogram. Echocardiography is a painless test that uses sound waves to create images of your heart. It provides your doctor with information about the size and shape of your heart and how well your heart's chambers and valves are working. This procedure takes approximately one hour. There are no restrictions for this procedure.   Please schedule for 3 months    Follow-Up: At [facility], you and your health needs are our priority.  As part of our continuing mission to provide you with exceptional heart care, we have created designated Provider Care Teams.  These Care Teams include your primary Cardiologist (physician) and Advanced Practice Providers (APPs -  Physician Assistants and Nurse Practitioners) who all work together to provide you with the care you need, when you need it.  We recommend signing up for the patient portal called "MyChart".  Sign up information is provided on this After Visit Summary.  MyChart is used to connect with patients for Virtual Visits (Telemedicine).  Patients are able to view lab/test results, encounter notes, upcoming appointments,  etc.  Non-urgent messages can be sent to your provider as well.   To learn more about what you can do with MyChart, go to [email or web address].    Your next appointment:   1 week (s)  3 month(s) after Echocardiogram   The format for your next appointment:   In Person  In Person  Provider:   Pharm D Quay Burow, MD  Other Instructions

## 2020-10-24 ENCOUNTER — Other Ambulatory Visit: Payer: Self-pay

## 2020-10-24 ENCOUNTER — Ambulatory Visit (INDEPENDENT_AMBULATORY_CARE_PROVIDER_SITE_OTHER): Payer: Medicare Other | Admitting: Pharmacist

## 2020-10-24 VITALS — BP 128/64 | HR 58 | Resp 15 | Ht 68.0 in | Wt 177.2 lb

## 2020-10-24 DIAGNOSIS — I255 Ischemic cardiomyopathy: Secondary | ICD-10-CM

## 2020-10-24 DIAGNOSIS — I200 Unstable angina: Secondary | ICD-10-CM

## 2020-10-24 MED ORDER — CARVEDILOL 12.5 MG PO TABS: 12.5000 mg | ORAL_TABLET | Freq: Two times a day (BID) | ORAL | 3 refills | Status: DC

## 2020-10-24 MED ORDER — CLOPIDOGREL BISULFATE 75 MG PO TABS: 75.0000 mg | ORAL_TABLET | Freq: Every day | ORAL | 11 refills | Status: DC

## 2020-10-24 MED ORDER — FUROSEMIDE 20 MG PO TABS: 20.0000 mg | ORAL_TABLET | Freq: Every day | ORAL | 11 refills | Status: DC | PRN

## 2020-10-24 MED ORDER — METHOCARBAMOL 500 MG PO TABS: 500.0000 mg | ORAL_TABLET | Freq: Every day | ORAL | 11 refills | Status: AC

## 2020-10-24 MED ORDER — PANTOPRAZOLE SODIUM 40 MG PO TBEC: 40.0000 mg | DELAYED_RELEASE_TABLET | Freq: Two times a day (BID) | ORAL | 3 refills | Status: AC

## 2020-10-24 MED ORDER — METFORMIN HCL ER (OSM) 500 MG PO TB24: 500.0000 mg | ORAL_TABLET | Freq: Every day | ORAL | 1 refills | Status: DC

## 2020-10-24 MED ORDER — ENTRESTO 49-51 MG PO TABS
1.0000 | ORAL_TABLET | Freq: Two times a day (BID) | ORAL | 11 refills | Status: DC
Start: 2020-10-24 — End: 2021-05-02

## 2020-10-24 NOTE — Patient Instructions (Signed)
Return for a  follow up appointment in 4 weeks  Go to the lab in 3 weeks   Check your blood pressure at home daily (if able) and keep record of the readings.  Take your BP meds as follows: *NO MEDICATION CHANGES*  Bring all of your meds, your BP cuff and your record of home blood pressures to your next appointment.  Exercise as you're able, try to walk approximately 30 minutes per day.  Keep salt intake to a minimum, especially watch canned and prepared boxed foods.  Eat more fresh fruits and vegetables and fewer canned items.  Avoid eating in fast food restaurants.    HOW TO TAKE YOUR BLOOD PRESSURE: . Rest 5 minutes before taking your blood pressure. .  Don't smoke or drink caffeinated beverages for at least 30 minutes before. . Take your blood pressure before (not after) you eat. . Sit comfortably with your back supported and both feet on the floor (don't cross your legs). . Elevate your arm to heart level on a table or a desk. . Use the proper sized cuff. It should fit smoothly and snugly around your bare upper arm. There should be enough room to slip a fingertip under the cuff. The bottom edge of the cuff should be 1 inch above the crease of the elbow. . Ideally, take 3 measurements at one sitting and record the average.

## 2020-10-24 NOTE — Progress Notes (Signed)
Patient ID: Victor Estes                 DOB: 03-Oct-1945                      MRN: [id number]     HPI: Victor Estes is a 75 y.o. male patient of Dr Gwenlyn Found  referred Victor Rives PA to PharmD clinic for medication titration. PMH includes CAD s/p MI in 1993, and CABG in 1995, HFrEF of 20-25 on Echo performed 08/2020, VT s/p ICD, carotid stenosis, hypertension, hyperlipidemia, and GERD.  Noted recent hospital admission on 09/22/2020 for unstable angina. Entresto dose was increased to 49/51mg  twice daily 2 weeks ago, but patient is very confused about his medication. Noted he is already on guideline recommended therapy for HFrEF including ARNI, BB, SGLT2i, and spironolactone.   Current HTN meds:  Entresto 49-51mg  twice daily Carvedilol 12.5mg  twice daily Spironolactone 12.5mg  daily Jardiance 10mg  daily Furosemide 20mg  daily as needed  BP goal: 130/80  Family History: The patient's family history includes Heart failure in his mother; Hypertension in his mother and another family member.  Social History: former smoker, denies alcohol use  Diet: low sodium, mainly home cooked  Home BP readings: none provided  Wt Readings from Last 3 Encounters:  10/24/20 177 lb 3.2 oz (80.4 kg)  10/08/20 174 lb 12.8 oz (79.3 kg)  10/01/20 174 lb 3.2 oz (79 kg)   BP Readings from Last 3 Encounters:  10/24/20 128/64  10/08/20 134/68  10/01/20 110/60   Pulse Readings from Last 3 Encounters:  10/24/20 (!) 58  10/08/20 69  10/01/20 63    Past Medical History:  Diagnosis Date  . Cancer ([place])   . Carotid artery disease ([place])   . GERD (gastroesophageal reflux disease)   . Hyperlipemia   . Hypertension    Dr Gwenlyn Found  . Myocardial infarct ([place])    CABG-1995- LIMA-D1-LAD, SVG-OM, SVG-RCA   . Ventricular tachycardia, sustained ([place]) 04/27/2015    Current Outpatient Medications on File Prior to Visit  Medication Sig Dispense Refill  . acetaminophen (TYLENOL) 325 MG tablet Take 650 mg by  mouth every 6 (six) hours as needed for mild pain, moderate pain, fever or headache.    [name] aspirin 81 MG EC tablet Take 1 tablet (81 mg total) by mouth daily with breakfast. 30 tablet 11  . atorvastatin (LIPITOR) 80 MG tablet Take 1 tablet (80 mg total) by mouth at bedtime. 30 tablet 11  . Carboxymethylcellulose Sodium 0.25 % SOLN Place 1 drop into both eyes in the morning and at bedtime.    . Cholecalciferol 2000 UNITS TABS Take 2,000 Units by mouth daily.    . empagliflozin (JARDIANCE) 10 MG TABS tablet Take 1 tablet (10 mg total) by mouth daily. 30 tablet 11  . ezetimibe (ZETIA) 10 MG tablet Take 1 tablet (10 mg total) by mouth daily. 30 tablet 11  . nitroGLYCERIN (NITROSTAT) 0.4 MG SL tablet Place 1 tablet (0.4 mg total) under the tongue every 5 (five) minutes as needed for chest pain. 25 tablet 2  . spironolactone (ALDACTONE) 25 MG tablet TAKE 1/2 TABLET (12.5 MG TOTAL) BY MOUTH DAILY. 15 tablet 11  . traMADol (ULTRAM) 50 MG tablet Take 1 tablet (50 mg total) by mouth every 6 (six) hours as needed for moderate pain or severe pain. 30 tablet    No current facility-administered medications on file prior to visit.    Allergies  Allergen Reactions  .  Clarithromycin Swelling    Blood pressure 128/64, pulse (!) 58, resp. rate 15, height 5\' 8"  (1.727 m), weight 177 lb 3.2 oz (80.4 kg), SpO2 97 %.  Ischemic cardiomyopathy Complete medication reconciliation done. Patient stopped taking clopidogrel for unknown reason, and is not sure about all his medication after recent hospital discharge.  I refilled all his pertinent medication including clopidogrel. Will repeat BMET in 3 weeks, and follow up in clinic in 4 week for further Entresto titration.   Gudrun Axe Rodriguez-Guzman PharmD, BCPS, [place] [address] ,[place] [address] 10/30/2020 1:00 PM

## 2020-10-25 ENCOUNTER — Other Ambulatory Visit (HOSPITAL_COMMUNITY): Payer: Self-pay

## 2020-10-30 ENCOUNTER — Encounter: Payer: Self-pay | Admitting: Pharmacist

## 2020-10-30 NOTE — Assessment & Plan Note (Addendum)
Complete medication reconciliation done. Patient stopped taking clopidogrel for unknown reason, and is not sure about all his medication after recent hospital discharge.  I refilled all his pertinent medication including clopidogrel. Will repeat BMET in 3 weeks, and follow up in clinic in 4 week for further Entresto titration.

## 2020-11-01 ENCOUNTER — Other Ambulatory Visit (HOSPITAL_COMMUNITY): Payer: Self-pay

## 2020-11-06 ENCOUNTER — Other Ambulatory Visit (HOSPITAL_COMMUNITY): Payer: Self-pay

## 2020-11-12 ENCOUNTER — Other Ambulatory Visit (HOSPITAL_COMMUNITY): Payer: Medicare Other

## 2020-11-19 ENCOUNTER — Other Ambulatory Visit: Payer: Self-pay

## 2020-11-19 ENCOUNTER — Ambulatory Visit (INDEPENDENT_AMBULATORY_CARE_PROVIDER_SITE_OTHER): Payer: Medicare Other | Admitting: Pharmacist Clinician (PhC)/ Clinical Pharmacy Specialist

## 2020-11-19 DIAGNOSIS — I5041 Acute combined systolic (congestive) and diastolic (congestive) heart failure: Secondary | ICD-10-CM | POA: Diagnosis not present

## 2020-11-19 DIAGNOSIS — I200 Unstable angina: Secondary | ICD-10-CM

## 2020-11-19 NOTE — Progress Notes (Signed)
Patient ID: Victor Estes                 DOB: 1945/09/29                      MRN: [id number]     HPI: Victor Estes is a 75 y.o. male patient of Dr Gwenlyn Found  referred Sande Rives PA to PharmD clinic for medication titration. PMH includes CAD s/p MI in 1993, and CABG in 1995, HFrEF of 20-25 on Echo performed 08/2020, VT s/p ICD, carotid stenosis, hypertension, hyperlipidemia, and GERD.  At his last visit in [facility] it was noted that he had been recently hospitalized for unstable angina.  His Entresto dose was increased to 49/51 mg.  He does have confusion about his medications, does not know them by name, but states no problems with compliance.    He returns today for follow up.  He has not been checking blood pressures at home.  Currently on GDMT, but we are limited in reaching maximum doses due to soft blood pressure readings.    Current HTN meds:  Entresto 49-51mg  twice daily Carvedilol 12.5mg  twice daily Spironolactone 12.5mg  daily Jardiance 10mg  daily Furosemide 20mg  daily as needed - 2 in the past month  BP goal: 130/80  Family History: The patient's family history includes Heart failure and hypertension in his mother - lived to 73; Hypertension in another family member.  Social History: former smoker - quit 40 years ago, denies alcohol use  Diet: low sodium, mainly home cooked  Home BP readings: none provided  Labs:  Na 136, K 4.8, Glu 158, BUN 20, SCr 1.05  Wt Readings from Last 3 Encounters:  11/19/20 182 lb (82.6 kg)  10/24/20 177 lb 3.2 oz (80.4 kg)  10/08/20 174 lb 12.8 oz (79.3 kg)   BP Readings from Last 3 Encounters:  11/19/20 114/78  10/24/20 128/64  10/08/20 134/68   Pulse Readings from Last 3 Encounters:  11/19/20 68  10/24/20 (!) 58  10/08/20 69    Past Medical History:  Diagnosis Date  . Cancer ([place])   . Carotid artery disease ([place])   . GERD (gastroesophageal reflux disease)   . Hyperlipemia   . Hypertension    Dr Gwenlyn Found  . Myocardial  infarct ([place])    CABG-1995- LIMA-D1-LAD, SVG-OM, SVG-RCA   . Ventricular tachycardia, sustained ([place]) 04/27/2015    Current Outpatient Medications on File Prior to Visit  Medication Sig Dispense Refill  . acetaminophen (TYLENOL) 325 MG tablet Take 650 mg by mouth every 6 (six) hours as needed for mild pain, moderate pain, fever or headache.    [name] aspirin 81 MG EC tablet Take 1 tablet (81 mg total) by mouth daily with breakfast. 30 tablet 11  . atorvastatin (LIPITOR) 80 MG tablet Take 1 tablet (80 mg total) by mouth at bedtime. 30 tablet 11  . Carboxymethylcellulose Sodium 0.25 % SOLN Place 1 drop into both eyes in the morning and at bedtime.    . carvedilol (COREG) 12.5 MG tablet Take 1 tablet (12.5 mg total) by mouth 2 (two) times daily with a meal. 180 tablet 3  . Cholecalciferol 2000 UNITS TABS Take 2,000 Units by mouth daily.    . empagliflozin (JARDIANCE) 10 MG TABS tablet Take 1 tablet (10 mg total) by mouth daily. 30 tablet 11  . furosemide (LASIX) 20 MG tablet Take 1 tablet (20 mg total) by mouth daily as needed (weight gain (3lbs in 1 day or  5lbs in 1 week)). 30 tablet 11  . metformin (FORTAMET) 500 MG (OSM) 24 hr tablet Take 1 tablet (500 mg total) by mouth daily with breakfast. 90 tablet 1  . methocarbamol (ROBAXIN) 500 MG tablet Take 1 tablet (500 mg total) by mouth at bedtime. 30 tablet 11  . nitroGLYCERIN (NITROSTAT) 0.4 MG SL tablet Place 1 tablet (0.4 mg total) under the tongue every 5 (five) minutes as needed for chest pain. 25 tablet 2  . pantoprazole (PROTONIX) 40 MG tablet Take 1 tablet (40 mg total) by mouth 2 (two) times daily. 180 tablet 3  . sacubitril-valsartan (ENTRESTO) 49-51 MG Take 1 tablet by mouth 2 (two) times daily. 60 tablet 11  . spironolactone (ALDACTONE) 25 MG tablet TAKE 1/2 TABLET (12.5 MG TOTAL) BY MOUTH DAILY. 15 tablet 11  . tamsulosin (FLOMAX) 0.4 MG CAPS capsule Take 0.4 mg by mouth.    . traMADol (ULTRAM) 50 MG tablet Take 1 tablet (50 mg total) by  mouth every 6 (six) hours as needed for moderate pain or severe pain. 30 tablet   . zinc gluconate 50 MG tablet Take 50 mg by mouth daily.     No current facility-administered medications on file prior to visit.    Allergies  Allergen Reactions  . Clarithromycin Swelling    Blood pressure 114/78, pulse 68, resp. rate 14, height 5\' 8"  (1.727 m), weight 182 lb (82.6 kg), SpO2 94 %.  CHF (congestive heart failure) ([place]) Patient with HFrEF (20-25% by most recent echo - March 2022).  He is doing well on GDMT with no concerns for compliance.   We don't have any home blood pressure readings to evaluate, but his office reading is good, and I would be concerned about causing hypotension if we increased any medications at this time.  Asked that he check home readings a few times each week and bring that information when he comes to see Dr. Gwenlyn Found in 6 weeks.  If his home readings or heart rate are slightly higher, we could consider increasing medications at that time.    Tommy Medal PharmD CPP [facility] [place] [id number] 11/19/2020 5:01 PM

## 2020-11-19 NOTE — Assessment & Plan Note (Addendum)
Patient with HFrEF (20-25% by most recent echo - March 2022).  He is doing well on GDMT with no concerns for compliance.   We don't have any home blood pressure readings to evaluate, but his office reading is good, and I would be concerned about causing hypotension if we increased any medications at this time.  Asked that he check home readings a few times each week and bring that information when he comes to see Dr. Gwenlyn Found in 6 weeks.  If his home readings or heart rate are slightly higher, we could consider increasing medications at that time.

## 2020-11-19 NOTE — Patient Instructions (Signed)
Return for a a follow up appointment July 8 with Dr. Gwenlyn Found  Check your blood pressure at home 2-3 times each week and keep record of the readings.  Take your BP meds as follows:  No changes to medication today  Bring all of your meds, your BP cuff and your record of home blood pressures to your next appointment.  Exercise as you're able, try to walk approximately 30 minutes per day.  Keep salt intake to a minimum, especially watch canned and prepared boxed foods.  Eat more fresh fruits and vegetables and fewer canned items.  Avoid eating in fast food restaurants.    HOW TO TAKE YOUR BLOOD PRESSURE: . Rest 5 minutes before taking your blood pressure. .  Don't smoke or drink caffeinated beverages for at least 30 minutes before. . Take your blood pressure before (not after) you eat. . Sit comfortably with your back supported and both feet on the floor (don't cross your legs). . Elevate your arm to heart level on a table or a desk. . Use the proper sized cuff. It should fit smoothly and snugly around your bare upper arm. There should be enough room to slip a fingertip under the cuff. The bottom edge of the cuff should be 1 inch above the crease of the elbow. . Ideally, take 3 measurements at one sitting and record the average.

## 2020-11-21 ENCOUNTER — Ambulatory Visit: Payer: Medicare Other

## 2020-11-26 ENCOUNTER — Other Ambulatory Visit (HOSPITAL_COMMUNITY): Payer: Self-pay

## 2020-12-02 ENCOUNTER — Other Ambulatory Visit: Payer: Self-pay

## 2020-12-02 MED ORDER — EMPAGLIFLOZIN 10 MG PO TABS: 10.0000 mg | ORAL_TABLET | Freq: Every day | ORAL | 3 refills | Status: DC

## 2020-12-23 ENCOUNTER — Ambulatory Visit (INDEPENDENT_AMBULATORY_CARE_PROVIDER_SITE_OTHER): Payer: Medicare Other

## 2020-12-23 DIAGNOSIS — I440 Atrioventricular block, first degree: Secondary | ICD-10-CM | POA: Diagnosis not present

## 2020-12-24 ENCOUNTER — Ambulatory Visit: Payer: Medicare Other | Admitting: Cardiovascular Disease

## 2020-12-24 LAB — CUP PACEART REMOTE DEVICE CHECK
Battery Remaining Longevity: 58 mo
Battery Remaining Percentage: 57 %
Battery Voltage: 2.93 V
Brady Statistic RV Percent Paced: 3.3 %
Date Time Interrogation Session: 20220627035818
HighPow Impedance: 69 Ohm
HighPow Impedance: 69 Ohm
Implantable Lead Implant Date: 20161031
Implantable Lead Location: 753860
Implantable Lead Model: 181
Implantable Lead Serial Number: 333140
Implantable Pulse Generator Implant Date: 20161031
Lead Channel Impedance Value: 330 Ohm
Lead Channel Pacing Threshold Amplitude: 1 V
Lead Channel Pacing Threshold Pulse Width: 0.5 ms
Lead Channel Sensing Intrinsic Amplitude: 6.5 mV
Lead Channel Setting Pacing Amplitude: 2.5 V
Lead Channel Setting Pacing Pulse Width: 0.5 ms
Lead Channel Setting Sensing Sensitivity: 0.5 mV
Pulse Gen Serial Number: 7308468

## 2020-12-31 ENCOUNTER — Other Ambulatory Visit: Payer: Self-pay

## 2020-12-31 ENCOUNTER — Ambulatory Visit (INDEPENDENT_AMBULATORY_CARE_PROVIDER_SITE_OTHER): Payer: Medicare Other | Admitting: Cardiovascular Disease

## 2020-12-31 ENCOUNTER — Encounter: Payer: Self-pay | Admitting: Cardiovascular Disease

## 2020-12-31 VITALS — BP 108/58 | HR 65 | Ht 68.0 in | Wt 176.0 lb

## 2020-12-31 DIAGNOSIS — I2510 Atherosclerotic heart disease of native coronary artery without angina pectoris: Secondary | ICD-10-CM

## 2020-12-31 DIAGNOSIS — E785 Hyperlipidemia, unspecified: Secondary | ICD-10-CM

## 2020-12-31 DIAGNOSIS — I472 Ventricular tachycardia, unspecified: Secondary | ICD-10-CM

## 2020-12-31 DIAGNOSIS — I200 Unstable angina: Secondary | ICD-10-CM | POA: Diagnosis not present

## 2020-12-31 DIAGNOSIS — I255 Ischemic cardiomyopathy: Secondary | ICD-10-CM

## 2020-12-31 DIAGNOSIS — I10 Essential (primary) hypertension: Secondary | ICD-10-CM

## 2020-12-31 DIAGNOSIS — I6523 Occlusion and stenosis of bilateral carotid arteries: Secondary | ICD-10-CM | POA: Diagnosis not present

## 2020-12-31 NOTE — Assessment & Plan Note (Signed)
History of ischemic cardiomyopathy with an EF by 2D echo 09/13/2020 of 40 to 45% and then again on 09/15/2020 of 20 to 25% for unclear reasons.  He currently is on guideline directed optimal medical therapy and is asymptomatic.  I am going to recheck a 2D echocardiogram.

## 2020-12-31 NOTE — Assessment & Plan Note (Signed)
History of essential hypertension a blood pressure measured today at 108/58.  He is on carvedilol and Entresto.

## 2020-12-31 NOTE — Progress Notes (Signed)
12/31/2020 Victor Estes   1945-08-14  [id number]  Primary Physician Pcp, No Primary Cardiologist: Lorretta Harp MD Gar[facility], [place], [place]  HPI:  Victor Estes is a 75 y.o.  mildly overweight, married Caucasian male, father of 88, grandfather to 5 grandchildren who I have been taking care of for the last 30 years. I last saw him in the office 09/17/2020.Marland KitchenHe had an MI back in 1993 and subsequent coronary artery bypass grafting x4 in 1995. His risk factors are remarkable for remote tobacco abuse, hypertension, hyperlipidemia, and family history. His last Myoview performed 2 year ago showed inferior scar without ischemia, unchanged from prior studies. A 2D echo revealed an EF of 45% to 50% with inferior and septal hypokinesia. Carotid Dopplers showed moderate right ICA stenosis unchanged from prior studies. He is neurologically asymptomatic. [facility] [place] 03/31/13 revealed a total cholesterol of 141, LDL 61 HDL 63. He denies chest pain or shortness of breath. He had  elective TKR replacement by Dr. Percell Miller 07/05/13. I obtained a Myoview stress test 03/29/13 that showed impaired scar without ischemia. The epicardium slightly to 40% by quantitative gated SPECT. A 2-D echocardiogram performed 05/24/13 revealed an ejection fraction of 40-45%.. Since I saw him a year ago he's remained clinically stable. He gets a rare episode of dizziness on the [place]. 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 He was admitted to [facility] on 04/27/15 with sustained ventricular tachycardia requiring cardioversion. He had a cardiac catheterization by Dr. Burt Knack revealing stable anatomy without a "culprit lesion and therefore his VT was thought to be "scar related". Based on this, Dr. Caryl Comes placed a ICD for secondary prevention as follow this up as an outpatient since.   He did develop COVID-19 back in January and has been slowly recovering  since that time.  He had a persistent cough and was treated by Dr. Jomarie Longs.  Because of dyspnea on exertion I performed 2D echocardiography on 09/13/2020 revealing ejection fraction of 40 to 45% without significant valvular abnormalities.  A Myoview stress test performed at the same time showed inferior scar without ischemia unchanged from prior studies.  After I saw him on 09/17/2020 he was admitted to the hospital 5 days later with volume overload systolic heart failure and was diuresed.  His EF at that time had declined to 20 to 25% from 40 to 45% a week or 2 prior to that for unclear reasons.  He underwent right left heart cath by Dr. Claiborne Billings who found unchanged anatomy.  He has been seen as an outpatient by our Pharm.D.'s who have been titrating his heart failure medications and currently he is on guideline directed optimal medical therapy and is clinically improved.  Current Meds  Medication Sig   acetaminophen (TYLENOL) 325 MG tablet Take 650 mg by mouth every 6 (six) hours as needed for mild pain, moderate pain, fever or headache.   aspirin 81 MG EC tablet Take 1 tablet (81 mg total) by mouth daily with breakfast.   atorvastatin (LIPITOR) 80 MG tablet Take 1 tablet (80 mg total) by mouth at bedtime.   Carboxymethylcellulose Sodium 0.25 % SOLN Place 1 drop into both eyes in the morning and at bedtime.   carvedilol (COREG) 12.5 MG tablet Take 1 tablet (12.5 mg total) by mouth 2 (two) times daily with a meal.   Cholecalciferol 2000 UNITS TABS Take 2,000 Units by mouth daily.  empagliflozin (JARDIANCE) 10 MG TABS tablet Take 1 tablet (10 mg total) by mouth daily.   furosemide (LASIX) 20 MG tablet Take 1 tablet (20 mg total) by mouth daily as needed (weight gain (3lbs in 1 day or 5lbs in 1 week)).   metformin (FORTAMET) 500 MG (OSM) 24 hr tablet Take 1 tablet (500 mg total) by mouth daily with breakfast.   methocarbamol (ROBAXIN) 500 MG tablet Take 1 tablet (500 mg total) by mouth at bedtime.    nitroGLYCERIN (NITROSTAT) 0.4 MG SL tablet Place 1 tablet (0.4 mg total) under the tongue every 5 (five) minutes as needed for chest pain.   pantoprazole (PROTONIX) 40 MG tablet Take 1 tablet (40 mg total) by mouth 2 (two) times daily.   sacubitril-valsartan (ENTRESTO) 49-51 MG Take 1 tablet by mouth 2 (two) times daily.   spironolactone (ALDACTONE) 25 MG tablet TAKE 1/2 TABLET (12.5 MG TOTAL) BY MOUTH DAILY.   tamsulosin (FLOMAX) 0.4 MG CAPS capsule Take 0.4 mg by mouth.   traMADol (ULTRAM) 50 MG tablet Take 1 tablet (50 mg total) by mouth every 6 (six) hours as needed for moderate pain or severe pain.   zinc gluconate 50 MG tablet Take 50 mg by mouth daily.     Allergies  Allergen Reactions   Clarithromycin Swelling    Social History   Socioeconomic History   Marital status: Married    Spouse name: Veterinary surgeon   Number of children: 2   Years of education: Not on file   Highest education level: High school graduate  Occupational History   Occupation: Owns a Fish farm manager  Tobacco Use   Smoking status: Former    Pack years: 0.00    Types: Cigarettes    Quit date: 04/06/1983    Years since quitting: 37.7   Smokeless tobacco: Never  Vaping Use   Vaping Use: Never used  Substance and Sexual Activity   Alcohol use: No    Alcohol/week: 0.0 standard drinks   Drug use: No   Sexual activity: Not on file  Other Topics Concern   Not on file  Social History Narrative   Not on file   Social Determinants of Health   Financial Resource Strain: Low Risk    Difficulty of Paying Living Expenses: Not hard at all  Food Insecurity: No Food Insecurity   Worried About Charity fundraiser in the Last Year: Never true   [place] in the Last Year: Never true  Transportation Needs: No Transportation Needs   Lack of Transportation (Medical): No   Lack of Transportation (Non-Medical): No  Physical Activity: Insufficiently Active   Days of Exercise per Week: 3 days   Minutes  of Exercise per Session: 30 min  Stress: Not on file  Social Connections: Not on file  Intimate Partner Violence: Not on file     Review of Systems: General: negative for chills, fever, night sweats or weight changes.  Cardiovascular: negative for chest pain, dyspnea on exertion, edema, orthopnea, palpitations, paroxysmal nocturnal dyspnea or shortness of breath Dermatological: negative for rash Respiratory: negative for cough or wheezing Urologic: negative for hematuria Abdominal: negative for nausea, vomiting, diarrhea, bright red blood per rectum, melena, or hematemesis Neurologic: negative for visual changes, syncope, or dizziness All other systems reviewed and are otherwise negative except as noted above.    Blood pressure (!) 108/58, pulse 65, height 5\' 8"  (1.727 m), weight 176 lb (79.8 kg).  General appearance: alert and no distress Neck:  no adenopathy, no carotid bruit, no JVD, supple, symmetrical, trachea midline, and thyroid not enlarged, symmetric, no tenderness/mass/nodules Lungs: clear to auscultation bilaterally Heart: regular rate and rhythm, S1, S2 normal, no murmur, click, rub or gallop Extremities: extremities normal, atraumatic, no cyanosis or edema Pulses: 2+ and symmetric Skin: Skin color, texture, turgor normal. No rashes or lesions Neurologic: Grossly normal  EKG sinus rhythm at 65 with inferior Q waves and lateral T wave inversion with first-degree AV block.  He has voltage criteria for LVH.  I personally reviewed this EKG.    ASSESSMENT AND PLAN:   CAD (coronary artery disease) History of of CAD status post myocardial infarction back in 1993 and subsequent coronary artery bypass grafting x4 in 1995.  He had cardiac catheterization performed by Dr. Burt Knack October 2016 revealing stable anatomy without "culprit lesion" had ventricular tachycardia that time was thought to be "scar related".  He did have COVID-19 back in January.  He was admitted to the hospital  with volume overload and heart failure 09/22/2020 discharged home 3 days later.  He underwent cardiac catheterization 09/24/2020 by Dr. Claiborne Billings again revealing stable anatomy with a patent LIMA to an LAD and diagonal branch sequentially, patent vein to an obtuse marginal branch and an occluded RCA vein graft with left-to-right collaterals.  Currently denies chest pain or shortness of breath.  He did have a Myoview performed 09/06/2020 that showed inferior scar without ischemia similar to previous Myoview performed in 2014.  Hypertension History of essential hypertension a blood pressure measured today at 108/58.  He is on carvedilol and Entresto.  Hyperlipidemia LDL goal <70 History of hyperlipidemia on statin therapy with lipid profile performed 09/24/2020 revealing a total cholesterol of 135, LDL of 81 and HDL 32.  Carotid artery disease (HCC) History of carotid artery disease with moderate to moderately severe right ICA stenosis by duplex ultrasound 08/18/2020 that we will repeat the repeated in 1 year.  Ventricular tachycardia, sustained ([place]) History of ventricular tachycardia past thought to be related to "scar VT".  He did have an ICD placed by Dr. Caryl Comes for this who follows him as an outpatient.  Ischemic cardiomyopathy History of ischemic cardiomyopathy with an EF by 2D echo 09/13/2020 of 40 to 45% and then again on 09/15/2020 of 20 to 25% for unclear reasons.  He currently is on guideline directed optimal medical therapy and is asymptomatic.  I am going to recheck a 2D echocardiogram.     Lorretta Harp MD [facility], [facility] 12/31/2020 10:09 AM

## 2020-12-31 NOTE — Assessment & Plan Note (Signed)
History of carotid artery disease with moderate to moderately severe right ICA stenosis by duplex ultrasound 08/18/2020 that we will repeat the repeated in 1 year.

## 2020-12-31 NOTE — Assessment & Plan Note (Signed)
History of of CAD status post myocardial infarction back in 1993 and subsequent coronary artery bypass grafting x4 in 1995.  He had cardiac catheterization performed by Dr. Burt Knack October 2016 revealing stable anatomy without "culprit lesion" had ventricular tachycardia that time was thought to be "scar related".  He did have COVID-19 back in January.  He was admitted to the hospital with volume overload and heart failure 09/22/2020 discharged home 3 days later.  He underwent cardiac catheterization 09/24/2020 by Dr. Claiborne Billings again revealing stable anatomy with a patent LIMA to an LAD and diagonal branch sequentially, patent vein to an obtuse marginal branch and an occluded RCA vein graft with left-to-right collaterals.  Currently denies chest pain or shortness of breath.  He did have a Myoview performed 09/06/2020 that showed inferior scar without ischemia similar to previous Myoview performed in 2014.

## 2020-12-31 NOTE — Assessment & Plan Note (Signed)
History of hyperlipidemia on statin therapy with lipid profile performed 09/24/2020 revealing a total cholesterol of 135, LDL of 81 and HDL 32.

## 2020-12-31 NOTE — Patient Instructions (Signed)
Medication Instructions:  No Changes In Medications at this time.  *If you need a refill on your cardiac medications before your next appointment, please call your pharmacy*  Testing/Procedures: Your physician has requested that you have an echocardiogram. Echocardiography is a painless test that uses sound waves to create images of your heart. It provides your doctor with information about the size and shape of your heart and how well your heart's chambers and valves are working. You may receive an ultrasound enhancing agent through an IV if needed to better visualize your heart during the echo.This procedure takes approximately one hour. There are no restrictions for this procedure. This will take place at the [address].   Follow-Up: At [facility], you and your health needs are our priority.  As part of our continuing mission to provide you with exceptional heart care, we have created designated Provider Care Teams.  These Care Teams include your primary Cardiologist (physician) and Advanced Practice Providers (APPs -  Physician Assistants and Nurse Practitioners) who all work together to provide you with the care you need, when you need it.  Your next appointment:   6 month(s)  The format for your next appointment:   In Person  Provider:   Quay Burow, MD

## 2020-12-31 NOTE — Assessment & Plan Note (Signed)
History of ventricular tachycardia past thought to be related to "scar VT".  He did have an ICD placed by Dr. Caryl Comes for this who follows him as an outpatient.

## 2021-01-03 ENCOUNTER — Ambulatory Visit: Payer: Medicare Other | Admitting: Cardiovascular Disease

## 2021-01-08 ENCOUNTER — Other Ambulatory Visit (HOSPITAL_COMMUNITY): Payer: Medicare Other

## 2021-01-13 NOTE — Progress Notes (Signed)
Remote ICD transmission.   

## 2021-01-20 ENCOUNTER — Ambulatory Visit (HOSPITAL_COMMUNITY): Payer: Medicare Other | Attending: Student

## 2021-01-20 ENCOUNTER — Other Ambulatory Visit: Payer: Self-pay

## 2021-01-20 DIAGNOSIS — I5042 Chronic combined systolic (congestive) and diastolic (congestive) heart failure: Secondary | ICD-10-CM | POA: Insufficient documentation

## 2021-01-20 LAB — ECHOCARDIOGRAM COMPLETE
AR max vel: 1.54 cm2
AV Area VTI: 1.61 cm2
AV Area mean vel: 1.52 cm2
AV Mean grad: 7.3 mmHg
AV Peak grad: 13.2 mmHg
Ao pk vel: 1.82 m/s
Area-P 1/2: 2.06 cm2
P 1/2 time: 663 msec
S' Lateral: 5.5 cm

## 2021-03-03 ENCOUNTER — Other Ambulatory Visit: Payer: Self-pay | Admitting: Student

## 2021-03-04 NOTE — Telephone Encounter (Signed)
This is Dr. Berry's pt 

## 2021-03-11 DIAGNOSIS — Z6828 Body mass index (BMI) 28.0-28.9, adult: Secondary | ICD-10-CM | POA: Diagnosis not present

## 2021-03-11 DIAGNOSIS — I10 Essential (primary) hypertension: Secondary | ICD-10-CM | POA: Diagnosis not present

## 2021-03-11 DIAGNOSIS — M5416 Radiculopathy, lumbar region: Secondary | ICD-10-CM | POA: Diagnosis not present

## 2021-03-24 ENCOUNTER — Ambulatory Visit (INDEPENDENT_AMBULATORY_CARE_PROVIDER_SITE_OTHER): Payer: Medicare Other

## 2021-03-24 DIAGNOSIS — I472 Ventricular tachycardia, unspecified: Secondary | ICD-10-CM

## 2021-03-24 LAB — CUP PACEART REMOTE DEVICE CHECK
Battery Remaining Longevity: 55 mo
Battery Remaining Percentage: 55 %
Battery Voltage: 2.93 V
Brady Statistic RV Percent Paced: 3.6 %
Date Time Interrogation Session: 20220926020016
HighPow Impedance: 57 Ohm
HighPow Impedance: 57 Ohm
Implantable Lead Implant Date: 20161031
Implantable Lead Location: 753860
Implantable Lead Model: 181
Implantable Lead Serial Number: 333140
Implantable Pulse Generator Implant Date: 20161031
Lead Channel Impedance Value: 310 Ohm
Lead Channel Pacing Threshold Amplitude: 1 V
Lead Channel Pacing Threshold Pulse Width: 0.5 ms
Lead Channel Sensing Intrinsic Amplitude: 4.9 mV
Lead Channel Setting Pacing Amplitude: 2.5 V
Lead Channel Setting Pacing Pulse Width: 0.5 ms
Lead Channel Setting Sensing Sensitivity: 0.5 mV
Pulse Gen Serial Number: 7308468

## 2021-03-31 NOTE — Progress Notes (Signed)
Remote ICD transmission.   

## 2021-04-08 DIAGNOSIS — Z6828 Body mass index (BMI) 28.0-28.9, adult: Secondary | ICD-10-CM | POA: Diagnosis not present

## 2021-04-08 DIAGNOSIS — M5416 Radiculopathy, lumbar region: Secondary | ICD-10-CM | POA: Diagnosis not present

## 2021-04-08 DIAGNOSIS — I10 Essential (primary) hypertension: Secondary | ICD-10-CM | POA: Diagnosis not present

## 2021-04-29 DIAGNOSIS — M1711 Unilateral primary osteoarthritis, right knee: Secondary | ICD-10-CM | POA: Diagnosis not present

## 2021-05-02 ENCOUNTER — Other Ambulatory Visit: Payer: Self-pay | Admitting: Student

## 2021-05-20 ENCOUNTER — Telehealth: Payer: Self-pay | Admitting: *Deleted

## 2021-05-20 NOTE — Telephone Encounter (Signed)
   [facility] Pre-operative Risk Assessment    Patient Name: Victor Estes  DOB: [birth year] MRN: [id number]  HEARTCARE STAFF:  - IMPORTANT!!!!!! Under Visit Info/Reason for Call, type in Other and utilize the format Clearance MM/DD/YY or Clearance TBD. Do not use dashes or single digits. - Please review there is not already an duplicate clearance open for this procedure. - If request is for dental extraction, please clarify the # of teeth to be extracted. - If the patient is currently at the dentist's office, call Pre-Op Callback Staff (MA/nurse) to input urgent request.  - If the patient is not currently in the dentist office, please route to the Pre-Op pool.  Request for surgical clearance:  What type of surgery is being performed? LESI- left- L5-S1-transforaminal  When is this surgery scheduled? TBD  What type of clearance is required (medical clearance vs. Pharmacy clearance to hold med vs. Both)? both  Are there any medications that need to be held prior to surgery and how long? Plavix-7 days prior-resume the day after  Practice name and name of physician performing surgery? [facility]  What is the office phone number? [phone]   7.   What is the office fax number? [phone]  8.   Anesthesia type (None, local, MAC, general) ? Not listed   Fredia Beets 05/20/2021, 3:48 PM  _________________________________________________________________   (provider comments below)

## 2021-05-20 NOTE — Telephone Encounter (Signed)
Hi Victor Estes,   Patient has upcoming spinal injection planned and will need to hold Plavix. He has a history of CAD with remote MI in 1993 and subsequent CABG x4 in 1995, ischemic cardiomyopathy/chronic combined CHF, and VT s/p ICD. Last cath in 08/2020 showed stable CAD with occluded SVG to distal RCA but patent sequential LIMA to Diag and mid LAD and SVG to OM2. Last Echo in 12/2020 showed LVEF of 30%. You last saw the patient in 12/2020 at which time he was stable from a cardiac standpoint. Can patient hold Plavix for 7 days prior to spinal injection?  Please route response back to P CV DIV PREOP.  Thank you! Victor Estes

## 2021-05-26 NOTE — Telephone Encounter (Signed)
   Name: Victor Estes  DOB: 1946/05/17  MRN: [id number]   Primary Cardiologist: Quay Burow, MD  Chart reviewed as part of pre-operative protocol coverage. Patient was contacted 05/26/2021 in reference to pre-operative risk assessment for pending surgery as outlined below.  Victor Estes was last seen on 12/31/20 by Dr. Gwenlyn Found.  Since that day, Victor Estes has done well.  He can complete more than 4.0 METS without angina (still works as a Development worker, community).   Per Dr. Gwenlyn Found - he may hold plavix 7 prior to spinal injection, resume after when safe to do so.  Therefore, based on ACC/AHA guidelines, the patient would be at acceptable risk for the planned procedure without further cardiovascular testing.   The patient was advised that if he develops new symptoms prior to surgery to contact our office to arrange for a follow-up visit, and he verbalized understanding.  I will route this recommendation to the requesting party via Epic fax function and remove from pre-op pool. Please call with questions.  Tami Lin Dilan Fullenwider, PA 05/26/2021, 9:00 AM

## 2021-05-26 NOTE — Telephone Encounter (Signed)
PharmD -  Pt started on jardiance for CHF with new reduction in EF. When I called to clear him for spinal injections, he states the jardiance is over $150/30 day supply. He dose have access to the [place]. Can you please see if farxiga is better covered or if he an get farxiga from the [place]? I told him not to pick up the script yet and someone would reach out to him.   Thanks so much Angie

## 2021-05-28 MED ORDER — EMPAGLIFLOZIN 10 MG PO TABS: 10.0000 mg | ORAL_TABLET | Freq: Every day | ORAL | 0 refills | Status: DC

## 2021-05-28 NOTE — Addendum Note (Signed)
Addended by: Rockne Menghini on: 05/28/2021 01:07 PM   Modules accepted: Orders

## 2021-06-24 ENCOUNTER — Ambulatory Visit (INDEPENDENT_AMBULATORY_CARE_PROVIDER_SITE_OTHER): Payer: Medicare Other

## 2021-06-24 DIAGNOSIS — I472 Ventricular tachycardia, unspecified: Secondary | ICD-10-CM

## 2021-06-24 LAB — CUP PACEART REMOTE DEVICE CHECK
Battery Remaining Longevity: 53 mo
Battery Remaining Percentage: 53 %
Battery Voltage: 2.92 V
Brady Statistic RV Percent Paced: 3.8 %
Date Time Interrogation Session: 20221226020017
HighPow Impedance: 66 Ohm
HighPow Impedance: 66 Ohm
Implantable Lead Implant Date: 20161031
Implantable Lead Location: 753860
Implantable Lead Model: 181
Implantable Lead Serial Number: 333140
Implantable Pulse Generator Implant Date: 20161031
Lead Channel Impedance Value: 310 Ohm
Lead Channel Pacing Threshold Amplitude: 1 V
Lead Channel Pacing Threshold Pulse Width: 0.5 ms
Lead Channel Sensing Intrinsic Amplitude: 5.8 mV
Lead Channel Setting Pacing Amplitude: 2.5 V
Lead Channel Setting Pacing Pulse Width: 0.5 ms
Lead Channel Setting Sensing Sensitivity: 0.5 mV
Pulse Gen Serial Number: 7308468

## 2021-06-25 DIAGNOSIS — Z03818 Encounter for observation for suspected exposure to other biological agents ruled out: Secondary | ICD-10-CM | POA: Diagnosis not present

## 2021-06-25 DIAGNOSIS — R051 Acute cough: Secondary | ICD-10-CM | POA: Diagnosis not present

## 2021-06-25 DIAGNOSIS — J069 Acute upper respiratory infection, unspecified: Secondary | ICD-10-CM | POA: Diagnosis not present

## 2021-06-25 DIAGNOSIS — J209 Acute bronchitis, unspecified: Secondary | ICD-10-CM | POA: Diagnosis not present

## 2021-06-30 DIAGNOSIS — R399 Unspecified symptoms and signs involving the genitourinary system: Secondary | ICD-10-CM | POA: Diagnosis not present

## 2021-06-30 DIAGNOSIS — N451 Epididymitis: Secondary | ICD-10-CM | POA: Diagnosis not present

## 2021-07-04 NOTE — Progress Notes (Signed)
Remote ICD transmission.   

## 2021-08-20 ENCOUNTER — Ambulatory Visit (HOSPITAL_COMMUNITY)
Admission: RE | Admit: 2021-08-20 | Payer: Medicare Other | Source: Ambulatory Visit | Attending: Cardiovascular Disease | Admitting: Cardiovascular Disease

## 2021-09-08 ENCOUNTER — Other Ambulatory Visit: Payer: Self-pay | Admitting: Cardiovascular Disease

## 2021-09-08 DIAGNOSIS — I6523 Occlusion and stenosis of bilateral carotid arteries: Secondary | ICD-10-CM

## 2021-09-15 ENCOUNTER — Ambulatory Visit (HOSPITAL_COMMUNITY)
Admission: RE | Admit: 2021-09-15 | Discharge: 2021-09-15 | Disposition: A | Discharge status: Home / Self Care | Payer: Medicare Other | Source: Ambulatory Visit | Attending: Cardiology | Admitting: Cardiology

## 2021-09-15 ENCOUNTER — Other Ambulatory Visit: Payer: Self-pay

## 2021-09-15 DIAGNOSIS — I6523 Occlusion and stenosis of bilateral carotid arteries: Secondary | ICD-10-CM | POA: Diagnosis not present

## 2021-09-22 ENCOUNTER — Ambulatory Visit (INDEPENDENT_AMBULATORY_CARE_PROVIDER_SITE_OTHER): Payer: Medicare Other

## 2021-09-22 ENCOUNTER — Telehealth: Payer: Self-pay

## 2021-09-22 DIAGNOSIS — I472 Ventricular tachycardia, unspecified: Secondary | ICD-10-CM

## 2021-09-22 NOTE — Telephone Encounter (Signed)
Left message for pt to call back to discuss carotid doppler. Pt needs office visit with Dr. Gwenlyn Found to discuss.  ?

## 2021-09-23 ENCOUNTER — Other Ambulatory Visit: Payer: Self-pay | Admitting: Cardiovascular Disease

## 2021-09-23 NOTE — Telephone Encounter (Signed)
Left message to pt to call back. Pt needs OV in the near future to discuss carotid doppler.  ?

## 2021-09-24 LAB — CUP PACEART REMOTE DEVICE CHECK
Battery Remaining Longevity: 52 mo
Battery Remaining Percentage: 52 %
Battery Voltage: 2.93 V
Brady Statistic RV Percent Paced: 4 %
Date Time Interrogation Session: 20230327020017
HighPow Impedance: 60 Ohm
HighPow Impedance: 60 Ohm
Implantable Lead Implant Date: 20161031
Implantable Lead Location: 753860
Implantable Lead Model: 181
Implantable Lead Serial Number: 333140
Implantable Pulse Generator Implant Date: 20161031
Lead Channel Impedance Value: 310 Ohm
Lead Channel Pacing Threshold Amplitude: 1 V
Lead Channel Pacing Threshold Pulse Width: 0.5 ms
Lead Channel Sensing Intrinsic Amplitude: 5.4 mV
Lead Channel Setting Pacing Amplitude: 2.5 V
Lead Channel Setting Pacing Pulse Width: 0.5 ms
Lead Channel Setting Sensing Sensitivity: 0.5 mV
Pulse Gen Serial Number: 7308468

## 2021-10-01 NOTE — Progress Notes (Signed)
Remote ICD transmission.   

## 2021-10-10 NOTE — Telephone Encounter (Signed)
Pt returning call. Able to get pt scheduled for office visit. Pt verbalizes understanding.  ?

## 2021-10-13 DIAGNOSIS — M1711 Unilateral primary osteoarthritis, right knee: Secondary | ICD-10-CM | POA: Diagnosis not present

## 2021-10-22 ENCOUNTER — Ambulatory Visit (INDEPENDENT_AMBULATORY_CARE_PROVIDER_SITE_OTHER): Payer: Medicare Other | Admitting: Cardiovascular Disease

## 2021-10-22 ENCOUNTER — Encounter: Payer: Self-pay | Admitting: Cardiovascular Disease

## 2021-10-22 VITALS — BP 122/54 | HR 56 | Ht 68.0 in | Wt 186.0 lb

## 2021-10-22 DIAGNOSIS — I2510 Atherosclerotic heart disease of native coronary artery without angina pectoris: Secondary | ICD-10-CM

## 2021-10-22 DIAGNOSIS — E785 Hyperlipidemia, unspecified: Secondary | ICD-10-CM | POA: Diagnosis not present

## 2021-10-22 DIAGNOSIS — I255 Ischemic cardiomyopathy: Secondary | ICD-10-CM | POA: Diagnosis not present

## 2021-10-22 DIAGNOSIS — I10 Essential (primary) hypertension: Secondary | ICD-10-CM

## 2021-10-22 DIAGNOSIS — I6523 Occlusion and stenosis of bilateral carotid arteries: Secondary | ICD-10-CM | POA: Diagnosis not present

## 2021-10-22 NOTE — Patient Instructions (Addendum)
Medication Instructions:  ?Your physician recommends that you continue on your current medications as directed. Please refer to the Current Medication list given to you today. ? ?*If you need a refill on your cardiac medications before your next appointment, please call your pharmacy* ? ? ?Lab Work: ?Your physician recommends that you have labs drawn today: Lipid/liver profile ? ?If you have labs (blood work) drawn today and your tests are completely normal, you will receive your results only by: ?MyChart Message (if you have MyChart) OR ?A paper copy in the mail ?If you have any lab test that is abnormal or we need to change your treatment, we will call you to review the results. ? ? ?Testing/Procedures: ?Your physician has requested that you have an echocardiogram. Echocardiography is a painless test that uses sound waves to create images of your heart. It provides your doctor with information about the size and shape of your heart and how well your heart?s chambers and valves are working. This procedure takes approximately one hour. There are no restrictions for this procedure. This procedure will be done at [address] ? ? ? ?Follow-Up: ?At [facility], you and your health needs are our priority.  As part of our continuing mission to provide you with exceptional heart care, we have created designated Provider Care Teams.  These Care Teams include your primary Cardiologist (physician) and Advanced Practice Providers (APPs -  Physician Assistants and Nurse Practitioners) who all work together to provide you with the care you need, when you need it. ? ?We recommend signing up for the patient portal called "MyChart".  Sign up information is provided on this After Visit Summary.  MyChart is used to connect with patients for Virtual Visits (Telemedicine).  Patients are able to view lab/test results, encounter notes, upcoming appointments, etc.  Non-urgent messages can be sent to your provider as  well.   ?To learn more about what you can do with MyChart, go to [email or web address].   ? ?Your next appointment:   ?3 month(s) ? ?The format for your next appointment:   ?In Person ? ?Provider:   ?Quay Burow, MD ? ?Other Instructions ?Pt needs follow up visit with Dr. Caryl Comes. ? ?

## 2021-10-22 NOTE — Assessment & Plan Note (Signed)
History of carotid artery disease with moderate ICA stenosis a year ago by duplex ultrasound (08/22/2020.  His most recent carotid Dopplers however performed 09/15/2021 showed significant progression on the right now in the probably 90% range.  He is neurologically asymptomatic.  I am going to refer him to Dr. Carlis Abbott for consideration of revascularization.  He may be a TCAR candidate. ?

## 2021-10-22 NOTE — Progress Notes (Signed)
? ? ? ?10/22/2021 ?Victor Estes   ?January 05, 1946  ?[id number] ? ?Primary Physician Pcp, No ?Primary Cardiologist: Lorretta Harp MD Victor Estes, [place] ? ?HPI:  Victor Estes is a 76 y.o.  mildly overweight, married Caucasian male, father of 2, grandfather to 5 grandchildren who I have been taking care of for the last 30 years. I last saw him in the office 12/31/2020.Marland KitchenHe is accompanied by his wife Victor Estes today.  He had an MI back in 1993 and subsequent coronary artery bypass grafting x4 in 1995. His risk factors are remarkable for remote tobacco abuse, hypertension, hyperlipidemia, and family history. His last Myoview performed 2 year ago showed inferior scar without ischemia, unchanged from prior studies. A 2D echo revealed an EF of 45% to 50% with inferior and septal hypokinesia. Carotid Dopplers showed moderate right ICA stenosis unchanged from prior studies. He is neurologically asymptomatic. [facility] [place] 03/31/13 revealed a total cholesterol of 141, LDL 61 HDL 63. He denies chest pain or shortness of breath. He had  elective TKR replacement by Dr. Percell Miller 07/05/13. I obtained a Myoview stress test 03/29/13 that showed impaired scar without ischemia. The epicardium slightly to 40% by quantitative gated SPECT. A 2-D echocardiogram performed 05/24/13 revealed an ejection fraction of 40-45%.. Since I saw him a year ago he's remained clinically stable. He gets a rare episode of dizziness on the golf [place]. 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 He was admitted to [facility] on 04/27/15 with sustained ventricular tachycardia requiring cardioversion. He had a cardiac catheterization by Dr. Burt Knack revealing stable anatomy without a "culprit lesion and therefore his VT was thought to be "scar related". Based on this, Dr. Caryl Comes placed a ICD for secondary prevention as follow this up as an outpatient since. ?  ?He did develop COVID-19  back in January and has been slowly recovering since that time.  He had a persistent cough and was treated by Dr. Jomarie Longs.  Because of dyspnea on exertion I performed 2D echocardiography on 09/13/2020 revealing ejection fraction of 40 to 45% without significant valvular abnormalities.  A Myoview stress test performed at the same time showed inferior scar without ischemia unchanged from prior studies. ? ?After I saw him on 09/17/2020 he was admitted to the hospital 5 days later with volume overload systolic heart failure and was diuresed.  His EF at that time had declined to 20 to 25% from 40 to 45% a week or 2 prior to that for unclear reasons.  He underwent right left heart cath by Dr. Claiborne Billings who found unchanged anatomy.  He has been seen as an outpatient by our Pharm.D.'s who have been titrating his heart failure medications and currently he is on guideline directed optimal medical therapy and is clinically improved. ? ?Since I saw him close to a year ago he is continues to do well.  He continues to work as a Development worker, community.  He denies chest pain or shortness of breath.  His last echo performed a year ago showed an EF in the 30 to 35% range.  Recent carotid Doppler studies were performed 09/15/2021 showed significant progression of his right ICA stenosis.  He is neurologically asymptomatic. ? ? ?No outpatient medications have been marked as taking for the 10/22/21 encounter (Office Visit) with Lorretta Harp, MD.  ?  ? ?Allergies  ?Allergen Reactions  ? Clarithromycin Swelling  ? ? ?Social History  ? ?Socioeconomic  History  ? Marital status: Married  ?  Spouse name: Victor Estes  ? Number of children: 2  ? Years of education: Not on file  ? Highest education level: High school graduate  ?Occupational History  ? Occupation: Owns a Fish farm manager  ?Tobacco Use  ? Smoking status: Former  ?  Types: Cigarettes  ?  Quit date: 04/06/1983  ?  Years since quitting: 38.5  ? Smokeless tobacco: Never  ?Vaping Use  ? Vaping Use:  Never used  ?Substance and Sexual Activity  ? Alcohol use: No  ?  Alcohol/week: 0.0 standard drinks  ? Drug use: No  ? Sexual activity: Not on file  ?Other Topics Concern  ? Not on file  ?Social History Narrative  ? Not on file  ? ?Social Determinants of Health  ? ?Financial Resource Strain: Not on file  ?Food Insecurity: Not on file  ?Transportation Needs: Not on file  ?Physical Activity: Not on file  ?Stress: Not on file  ?Social Connections: Not on file  ?Intimate Partner Violence: Not on file  ?  ? ?Review of Systems: ?General: negative for chills, fever, night sweats or weight changes.  ?Cardiovascular: negative for chest pain, dyspnea on exertion, edema, orthopnea, palpitations, paroxysmal nocturnal dyspnea or shortness of breath ?Dermatological: negative for rash ?Respiratory: negative for cough or wheezing ?Urologic: negative for hematuria ?Abdominal: negative for nausea, vomiting, diarrhea, bright red blood per rectum, melena, or hematemesis ?Neurologic: negative for visual changes, syncope, or dizziness ?All other systems reviewed and are otherwise negative except as noted above. ? ? ? ?Blood pressure (!) 122/54, pulse (!) 56, height '5\' 8"'$  (1.727 m), weight 186 lb (84.4 kg).  ?General appearance: alert and no distress ?Neck: no adenopathy, no JVD, supple, symmetrical, trachea midline, thyroid not enlarged, symmetric, no tenderness/mass/nodules, and right carotid bruit ?Lungs: clear to auscultation bilaterally ?Heart: regular rate and rhythm, S1, S2 normal, no murmur, click, rub or gallop ?Extremities: extremities normal, atraumatic, no cyanosis or edema ?Pulses: 2+ and symmetric ?Skin: Skin color, texture, turgor normal. No rashes or lesions ?Neurologic: Grossly normal ? ?EKG sinus bradycardia 56 with first-degree AV block and borderline voltage for LVH.  There are inferior Q waves noted.  I personally reviewed this EKG. ? ?ASSESSMENT AND PLAN:  ? ?CAD (coronary artery disease) ?History of CAD status post  myocardial infarction back in 1993 and subsequent coronary bypass grafting x4 in 1995.  Because of a decline in his ejection fraction he underwent right left heart cath by Dr. Claiborne Billings 09/24/2020 revealing patent grafts.  He denies chest pain or shortness of breath. ? ?Hypertension ?History of essential hypertension with blood pressure measured today at 122/54.  He is on carvedilol, and Entresto as well as Aldactone. ? ?Hyperlipidemia LDL goal <70 ?History of hyperlipidemia on statin therapy.  We will recheck lipid liver profile today. ? ?Carotid artery disease ([place]) ?History of carotid artery disease with moderate ICA stenosis a year ago by duplex ultrasound (08/22/2020.  His most recent carotid Dopplers however performed 09/15/2021 showed significant progression on the right now in the probably 90% range.  He is neurologically asymptomatic.  I am going to refer him to Dr. Carlis Abbott for consideration of revascularization.  He may be a TCAR candidate. ? ?Ischemic cardiomyopathy ?History of ischemic cardiomyopathy with an EF that had historically been in the 40 to 45% range.  It had fallen down to the 20 to 25% range which led to a right left heart cath by Dr. Claiborne Billings on 09/24/2020 revealing  patent grafts.  His medications were optimized.  His last 2D echo performed 01/20/2021 revealed EF of 30 to 35% without valvular abnormality.  He is completely asymptomatic.  I am going to recheck a 2D echocardiogram. ? ? ? ? ?Lorretta Harp MD [facility], F[facility] ?10/22/2021 ?10:41 AM ?

## 2021-10-22 NOTE — Assessment & Plan Note (Signed)
History of ischemic cardiomyopathy with an EF that had historically been in the 40 to 45% range.  It had fallen down to the 20 to 25% range which led to a right left heart cath by Dr. Claiborne Billings on 09/24/2020 revealing patent grafts.  His medications were optimized.  His last 2D echo performed 01/20/2021 revealed EF of 30 to 35% without valvular abnormality.  He is completely asymptomatic.  I am going to recheck a 2D echocardiogram. ?

## 2021-10-22 NOTE — Assessment & Plan Note (Signed)
History of CAD status post myocardial infarction back in 1993 and subsequent coronary bypass grafting x4 in 1995.  Because of a decline in his ejection fraction he underwent right left heart cath by Dr. Claiborne Billings 09/24/2020 revealing patent grafts.  He denies chest pain or shortness of breath. ?

## 2021-10-22 NOTE — Assessment & Plan Note (Signed)
History of hyperlipidemia on statin therapy.  We will recheck lipid liver profile today. ?

## 2021-10-22 NOTE — Assessment & Plan Note (Signed)
History of essential hypertension with blood pressure measured today at 122/54.  He is on carvedilol, and Entresto as well as Aldactone. ?

## 2021-10-23 ENCOUNTER — Telehealth: Payer: Self-pay | Admitting: *Deleted

## 2021-10-23 DIAGNOSIS — E785 Hyperlipidemia, unspecified: Secondary | ICD-10-CM

## 2021-10-23 LAB — HEPATIC FUNCTION PANEL
ALT: 21 IU/L (ref 0–44)
AST: 18 IU/L (ref 0–40)
Albumin: 4 g/dL (ref 3.7–4.7)
Alkaline Phosphatase: 119 IU/L (ref 44–121)
Bilirubin Total: 0.6 mg/dL (ref 0.0–1.2)
Bilirubin, Direct: 0.14 mg/dL (ref 0.00–0.40)
Total Protein: 6.4 g/dL (ref 6.0–8.5)

## 2021-10-23 LAB — LIPID PANEL
Chol/HDL Ratio: 3.3 ratio (ref 0.0–5.0)
Cholesterol, Total: 172 mg/dL (ref 100–199)
HDL: 52 mg/dL (ref >39)
LDL Chol Calc (NIH): 107 mg/dL — ABNORMAL HIGH (ref 0–99)
Triglycerides: 70 mg/dL (ref 0–149)
VLDL Cholesterol Cal: 13 mg/dL (ref 5–40)

## 2021-10-23 NOTE — Telephone Encounter (Signed)
Left message for pt to call.

## 2021-10-23 NOTE — Telephone Encounter (Signed)
-----   Message from Lorretta Harp, MD sent at 10/23/2021  6:18 AM EDT ----- ?Not at goal for secondary prevention. Need to know what statin and dose he is currently on so we can adjust ?

## 2021-10-24 NOTE — Telephone Encounter (Signed)
Pt returning nurses call. Pease advise 

## 2021-10-24 NOTE — Telephone Encounter (Signed)
LM2CB 

## 2021-10-28 ENCOUNTER — Ambulatory Visit (INDEPENDENT_AMBULATORY_CARE_PROVIDER_SITE_OTHER): Payer: Medicare Other | Admitting: Vascular Surgery

## 2021-10-28 ENCOUNTER — Encounter: Payer: Self-pay | Admitting: Vascular Surgery

## 2021-10-28 VITALS — BP 151/81 | HR 45 | Temp 98.1°F | Resp 16 | Ht 68.0 in | Wt 184.0 lb

## 2021-10-28 DIAGNOSIS — I6521 Occlusion and stenosis of right carotid artery: Secondary | ICD-10-CM | POA: Diagnosis not present

## 2021-10-28 NOTE — Telephone Encounter (Signed)
Spoke with pt, he reports taking atorvastatin 40 mg in the morning and 70 mg in the evening. Patient instructed to stop the 40 mg dose in the morning and will forward this information to dr berry. ?

## 2021-10-28 NOTE — Progress Notes (Signed)
? ? ?Patient name: Victor Estes MRN: [id number] DOB: 05-24-1946 Sex: male ? ?REASON FOR CONSULT: Evaluate high grade right internal carotid stenosis  ? ?HPI: ?Victor Estes is a 76 y.o. male, with history of hypertension, hyperlipidemia, coronary artery disease status post four-vessel CABG in 1995 that presents for evaluation of high-grade right internal carotid artery stenosis.  This has been followed by Dr. Gwenlyn Found for many years.  Patient states that he is asymptomatic with no recent history of strokes or TIAs.  No focal weakness or vision loss. He denies any history of neck surgery or radiation.  He works as a Development worker, community.  He denies any chest pain or shortness of breath.  His last echo showed EF in the range of 30 to 35%. ? ?Past Medical History:  ?Diagnosis Date  ? [facility])   ? Carotid artery disease ([place])   ? GERD (gastroesophageal reflux disease)   ? Hyperlipemia   ? Hypertension   ? Dr Gwenlyn Found  ? Myocardial infarct [facility])   ? [id number]- LIMA-D1-LAD, SVG-OM, SVG-RCA   ? Ventricular tachycardia, sustained ([place]) 04/27/2015  ? ? ?Past Surgical History:  ?Procedure Laterality Date  ? BACK SURGERY    ? CARDIAC CATHETERIZATION N/A 04/27/2015  ? Severe native dz, LIMA-LAD & SVG-OM [place], SVG-RCA chronically occluded. Left Heart Cath and Coronary Angiography;  Surgeon: Sherren Mocha, MD;  Location: [place] CV LAB;  Service: Cardiovascular;  Laterality: N/A;  ? CHOLECYSTECTOMY    ? COLON SURGERY    ? CORONARY ARTERY BYPASS GRAFT  1995  ?  LIMA-D1-LAD, SVG-OM, SVG-RCA, Dr Redmond Pulling  ? ELBOW SURGERY    ? EP IMPLANTABLE DEVICE N/A 04/29/2015  ? Procedure: ICD Implant;  Surgeon: Deboraha Sprang, MD;  Location: [place] [facility];  Service: Cardiovascular;  Laterality: N/A; St Jude  ICD, serial number  [id number].  ? LUMBAR LAMINECTOMY/DECOMPRESSION MICRODISCECTOMY  09/09/2011  ? Procedure: LUMBAR LAMINECTOMY/DECOMPRESSION MICRODISCECTOMY 1 LEVEL;  Surgeon: Eustace Moore, MD;  Location: [place] [facility]UR[facility] OR[facility];  Service:  Neurosurgery;  Laterality: Right;  Right Lumbar Five-Sacral One Hemilaminectomy ?  ? RIGHT/LEFT HEART CATH AND CORONARY/GRAFT ANGIOGRAPHY N/A 09/24/2020  ? Procedure: RIGHT/LEFT HEART CATH AND CORONARY/GRAFT ANGIOGRAPHY;  Surgeon: Troy Sine, MD;  Location: [place] [facility];  Service: Cardiovascular;  Laterality: N/A;  ? TOTAL KNEE ARTHROPLASTY Left 07/26/2013  ? Procedure: TOTAL KNEE ARTHROPLASTY;  Surgeon: Ninetta Lights, MD;  Location: [place];  Service: Orthopedics;  Laterality: Left;  ? ? ?Family History  ?Problem Relation Age of Onset  ? Heart failure Mother   ? Hypertension Mother   ? Hypertension Other   ? ? ?SOCIAL HISTORY: ?Social History  ? ?Socioeconomic History  ? Marital status: Married  ?  Spouse name: Draven Natter  ? Number of children: 2  ? Years of education: Not on file  ? Highest education level: High school graduate  ?Occupational History  ? Occupation: Owns a Fish farm manager  ?Tobacco Use  ? Smoking status: Former  ?  Types: Cigarettes  ?  Quit date: 04/06/1983  ?  Years since quitting: 38.5  ? Smokeless tobacco: Never  ?Vaping Use  ? Vaping Use: Never used  ?Substance and Sexual Activity  ? Alcohol use: No  ?  Alcohol/week: 0.0 standard drinks  ? Drug use: No  ? Sexual activity: Not on file  ?Other Topics Concern  ? Not on file  ?Social History Narrative  ? Not on file  ? ?Social Determinants of Health  ? ?  Financial Resource Strain: Not on file  ?Food Insecurity: Not on file  ?Transportation Needs: Not on file  ?Physical Activity: Not on file  ?Stress: Not on file  ?Social Connections: Not on file  ?Intimate Partner Violence: Not on file  ? ? ?Allergies  ?Allergen Reactions  ? Clarithromycin Swelling  ? ? ?Current Outpatient Medications  ?Medication Sig Dispense Refill  ? acetaminophen (TYLENOL) 325 MG tablet Take 650 mg by mouth every 6 (six) hours as needed for mild pain, moderate pain, fever or headache.    ? aspirin 81 MG EC tablet Take 1 tablet (81 mg total) by mouth daily with  breakfast. 30 tablet 11  ? atorvastatin (LIPITOR) 80 MG tablet TAKE 1 TABLET BY MOUTH EVERY NIGHT AT BEDTIME 30 tablet 11  ? Carboxymethylcellulose Sodium 0.25 % SOLN Place 1 drop into both eyes in the morning and at bedtime.    ? carvedilol (COREG) 12.5 MG tablet Take 1 tablet (12.5 mg total) by mouth 2 (two) times daily with a meal. 180 tablet 3  ? Cholecalciferol 2000 UNITS TABS Take 2,000 Units by mouth daily.    ? empagliflozin (JARDIANCE) 10 MG TABS tablet Take 1 tablet (10 mg total) by mouth daily. 90 tablet 3  ? empagliflozin (JARDIANCE) 10 MG TABS tablet Take 1 tablet (10 mg total) by mouth daily before breakfast. 14 tablet 0  ? ENTRESTO 49-51 MG TAKE 1 TABLET BY MOUTH TWICE DAILY 60 tablet 11  ? metformin (FORTAMET) 500 MG (OSM) 24 hr tablet Take 1 tablet (500 mg total) by mouth daily with breakfast. 90 tablet 1  ? methocarbamol (ROBAXIN) 500 MG tablet Take 1 tablet (500 mg total) by mouth at bedtime. 30 tablet 11  ? pantoprazole (PROTONIX) 40 MG tablet Take 1 tablet (40 mg total) by mouth 2 (two) times daily. 180 tablet 3  ? tamsulosin (FLOMAX) 0.4 MG CAPS capsule Take 0.4 mg by mouth.    ? traMADol (ULTRAM) 50 MG tablet Take 1 tablet (50 mg total) by mouth every 6 (six) hours as needed for moderate pain or severe pain. 30 tablet   ? zinc gluconate 50 MG tablet Take 50 mg by mouth daily.    ? furosemide (LASIX) 20 MG tablet Take 1 tablet (20 mg total) by mouth daily as needed (weight gain (3lbs in 1 day or 5lbs in 1 week)). 30 tablet 11  ? nitroGLYCERIN (NITROSTAT) 0.4 MG SL tablet Place 1 tablet (0.4 mg total) under the tongue every 5 (five) minutes as needed for chest pain. 25 tablet 2  ? spironolactone (ALDACTONE) 25 MG tablet TAKE 1/2 TABLET (12.5 MG TOTAL) BY MOUTH DAILY. 15 tablet 11  ? ?No current facility-administered medications for this visit.  ? ? ?REVIEW OF SYSTEMS:  ?'[X]'$  denotes positive finding, '[ ]'$  denotes negative finding ?Cardiac  Comments:  ?Chest pain or chest pressure:    ?Shortness of  breath upon exertion:    ?Short of breath when lying flat:    ?Irregular heart rhythm:    ?    ?Vascular    ?Pain in calf, thigh, or hip brought on by ambulation:    ?Pain in feet at night that wakes you up from your sleep:     ?Blood clot in your veins:    ?Leg swelling:     ?    ?Pulmonary    ?Oxygen at home:    ?Productive cough:     ?Wheezing:     ?    ?Neurologic    ?  Sudden weakness in arms or legs:     ?Sudden numbness in arms or legs:     ?Sudden onset of difficulty speaking or slurred speech:    ?Temporary loss of vision in one eye:     ?Problems with dizziness:     ?    ?Gastrointestinal    ?Blood in stool:     ?Vomited blood:     ?    ?Genitourinary    ?Burning when urinating:     ?Blood in urine:    ?    ?Psychiatric    ?Major depression:     ?    ?Hematologic    ?Bleeding problems:    ?Problems with blood clotting too easily:    ?    ?Skin    ?Rashes or ulcers:    ?    ?Constitutional    ?Fever or chills:    ? ? ?PHYSICAL EXAM: ?Vitals:  ? 10/28/21 1115 10/28/21 1117  ?BP: (!) 155/73 (!) 151/81  ?Pulse: (!) 45 (!) 45  ?Resp: 16   ?Temp: 98.1 ?F (36.7 ?C)   ?TempSrc: Temporal   ?SpO2: 94%   ?Weight: 184 lb (83.5 kg)   ?Height: '5\' 8"'$  (1.727 m)   ? ? ?GENERAL: The patient is a well-nourished male, in no acute distress. The vital signs are documented above. ?CARDIAC: There is a regular rate and rhythm.  ?VASCULAR:  ?Palpable femoral pulses bilaterally ?Palpable DP pulses bilaterally ?PULMONARY: No respiratory distress. ?ABDOMEN: Soft and non-tender. ?MUSCULOSKELETAL: There are no major deformities or cyanosis. ?NEUROLOGIC: No focal weakness or paresthesias are detected.  CN II-XII grossly intact. ?SKIN: There are no ulcers or rashes noted. ?PSYCHIATRIC: The patient has a normal affect. ? ?DATA:  ? ?Carotid duplex 09/15/21 with evidence of a high-grade greater than 80% right ICA stenosis with velocity 460/151 and minimal 1 to 39% left ICA stenosis. ? ?Assessment/Plan: ? ?76 year old male presents for  evaluation of a high-grade greater than 80% right ICA stenosis that is asymptomatic and has been followed by Dr. Gwenlyn Found.  I discussed that in the setting of greater than 80% stenosis for asymptomatic disease, I would reco

## 2021-10-30 ENCOUNTER — Ambulatory Visit (HOSPITAL_COMMUNITY): Payer: Medicare Other | Attending: Cardiology

## 2021-10-30 DIAGNOSIS — I2510 Atherosclerotic heart disease of native coronary artery without angina pectoris: Secondary | ICD-10-CM | POA: Insufficient documentation

## 2021-10-30 DIAGNOSIS — I10 Essential (primary) hypertension: Secondary | ICD-10-CM | POA: Insufficient documentation

## 2021-10-30 DIAGNOSIS — E785 Hyperlipidemia, unspecified: Secondary | ICD-10-CM | POA: Diagnosis not present

## 2021-10-30 LAB — ECHOCARDIOGRAM COMPLETE
Area-P 1/2: 1.32 cm2
P 1/2 time: 854 msec
S' Lateral: 5.8 cm

## 2021-10-31 ENCOUNTER — Other Ambulatory Visit: Payer: Self-pay

## 2021-10-31 DIAGNOSIS — I6521 Occlusion and stenosis of right carotid artery: Secondary | ICD-10-CM

## 2021-11-04 MED ORDER — ATORVASTATIN CALCIUM 80 MG PO TABS: 80.0000 mg | ORAL_TABLET | Freq: Every day | ORAL | 3 refills | Status: DC

## 2021-11-04 NOTE — Telephone Encounter (Signed)
Spoke with pt, aware to continue 80 mg of atorvastatin once daily. Lab orders mailed to the pt  ?

## 2021-11-12 ENCOUNTER — Ambulatory Visit (HOSPITAL_COMMUNITY)
Admission: RE | Admit: 2021-11-12 | Discharge: 2021-11-12 | Disposition: A | Discharge status: Home / Self Care | Payer: Medicare Other | Source: Ambulatory Visit | Attending: Vascular Surgery | Admitting: Vascular Surgery

## 2021-11-12 DIAGNOSIS — I6523 Occlusion and stenosis of bilateral carotid arteries: Secondary | ICD-10-CM | POA: Diagnosis not present

## 2021-11-12 DIAGNOSIS — I6503 Occlusion and stenosis of bilateral vertebral arteries: Secondary | ICD-10-CM | POA: Diagnosis not present

## 2021-11-12 DIAGNOSIS — I672 Cerebral atherosclerosis: Secondary | ICD-10-CM | POA: Diagnosis not present

## 2021-11-12 DIAGNOSIS — I6521 Occlusion and stenosis of right carotid artery: Secondary | ICD-10-CM | POA: Insufficient documentation

## 2021-11-12 DIAGNOSIS — I771 Stricture of artery: Secondary | ICD-10-CM | POA: Diagnosis not present

## 2021-11-12 MED ORDER — IOHEXOL 350 MG/ML SOLN
100.0000 mL | Freq: Once | INTRAVENOUS | Status: AC | PRN
  Administered 2021-11-12: 100 mL via INTRAVENOUS

## 2021-11-18 ENCOUNTER — Ambulatory Visit (INDEPENDENT_AMBULATORY_CARE_PROVIDER_SITE_OTHER): Payer: Medicare Other | Admitting: Vascular Surgery

## 2021-11-18 ENCOUNTER — Encounter: Payer: Self-pay | Admitting: Vascular Surgery

## 2021-11-18 ENCOUNTER — Other Ambulatory Visit: Payer: Self-pay

## 2021-11-18 VITALS — BP 151/76 | HR 61 | Temp 98.2°F | Resp 18 | Ht 68.0 in | Wt 185.0 lb

## 2021-11-18 DIAGNOSIS — I6521 Occlusion and stenosis of right carotid artery: Secondary | ICD-10-CM

## 2021-11-18 MED ORDER — CLOPIDOGREL BISULFATE 75 MG PO TABS: 75.0000 mg | ORAL_TABLET | Freq: Every day | ORAL | 6 refills | Status: DC

## 2021-11-18 NOTE — Progress Notes (Signed)
Patient name: Victor Estes MRN: [id number] DOB: 1946/06/18 Sex: male  REASON FOR CONSULT: F/U after CTA neck to evaluate high grade right internal carotid artery stenosis   HPI: Victor Estes is a 76 y.o. male, with history of hypertension, hyperlipidemia, coronary artery disease status post four-vessel CABG in 1995 that presents for further evaluation of high-grade right internal carotid artery stenosis.  This has been followed by Dr. Gwenlyn Found for many years.  Patient states that he is asymptomatic with no recent history of strokes or TIAs.  No focal weakness or vision loss. He denies any history of neck surgery or radiation.  He works as a Development worker, community.  He denies any chest pain or shortness of breath.  His last echo showed EF in the range of 30 to 35%.  I sent him for CTA neck for evaluation of TCAR after discussion with Dr. Gwenlyn Found.  He presents today for follow-up and discussion after CTA neck.    Past Medical History:  Diagnosis Date   Cancer [facility])    Carotid artery disease ([place])    GERD (gastroesophageal reflux disease)    Hyperlipemia    Hypertension    Dr Gwenlyn Found   Myocardial infarct [facility])    CABG-1995- LIMA-D1-LAD, SVG-OM, SVG-RCA    Ventricular tachycardia, sustained ([place]) 04/27/2015    Past Surgical History:  Procedure Laterality Date   BACK SURGERY     CARDIAC CATHETERIZATION N/A 04/27/2015   Severe native dz, LIMA-LAD & SVG-OM OK, SVG-RCA chronically occluded. Left Heart Cath and Coronary Angiography;  Surgeon: Sherren Mocha, MD;  Location: [place] [facility];  Service: Cardiovascular;  Laterality: N/A;   CHOLECYSTECTOMY     COLON SURGERY     CORONARY ARTERY BYPASS GRAFT  1995    LIMA-D1-LAD, SVG-OM, SVG-RCA, Dr Redmond Pulling   ELBOW SURGERY     EP IMPLANTABLE DEVICE N/A 04/29/2015   Procedure: ICD Implant;  Surgeon: Deboraha Sprang, MD;  Location: [place] [facility];  Service: Cardiovascular;  Laterality: N/A; St Jude  ICD, serial number  [id number].   LUMBAR  LAMINECTOMY/DECOMPRESSION MICRODISCECTOMY  09/09/2011   Procedure: LUMBAR LAMINECTOMY/DECOMPRESSION MICRODISCECTOMY 1 LEVEL;  Surgeon: Eustace Moore, MD;  Location: [place] [facility];  Service: Neurosurgery;  Laterality: Right;  Right Lumbar Five-Sacral One Hemilaminectomy    RIGHT/LEFT HEART CATH AND CORONARY/GRAFT ANGIOGRAPHY N/A 09/24/2020   Procedure: RIGHT/LEFT HEART CATH AND CORONARY/GRAFT ANGIOGRAPHY;  Surgeon: Troy Sine, MD;  Location: [place] [facility];  Service: Cardiovascular;  Laterality: N/A;   TOTAL KNEE ARTHROPLASTY Left 07/26/2013   Procedure: TOTAL KNEE ARTHROPLASTY;  Surgeon: Ninetta Lights, MD;  Location: [place];  Service: Orthopedics;  Laterality: Left;    Family History  Problem Relation Age of Onset   Heart failure Mother    Hypertension Mother    Hypertension Other     SOCIAL HISTORY: Social History   Socioeconomic History   Marital status: Married    Spouse name: Veterinary surgeon   Number of children: 2   Years of education: Not on file   Highest education level: High school graduate  Occupational History   Occupation: Owns a Fish farm manager  Tobacco Use   Smoking status: Former    Types: Cigarettes    Quit date: 04/06/1983    Years since quitting: 38.6   Smokeless tobacco: Never  Vaping Use   Vaping Use: Never used  Substance and Sexual Activity   Alcohol use: No    Alcohol/week: 0.0 standard drinks  Drug use: No   Sexual activity: Not on file  Other Topics Concern   Not on file  Social History Narrative   Not on file   Social Determinants of Health   Financial Resource Strain: Not on file  Food Insecurity: Not on file  Transportation Needs: Not on file  Physical Activity: Not on file  Stress: Not on file  Social Connections: Not on file  Intimate Partner Violence: Not on file    Allergies  Allergen Reactions   Clarithromycin Swelling    Current Outpatient Medications  Medication Sig Dispense Refill   acetaminophen (TYLENOL) 325  MG tablet Take 650 mg by mouth every 6 (six) hours as needed for mild pain, moderate pain, fever or headache.     aspirin 81 MG EC tablet Take 1 tablet (81 mg total) by mouth daily with breakfast. 30 tablet 11   atorvastatin (LIPITOR) 80 MG tablet Take 1 tablet (80 mg total) by mouth at bedtime. 90 tablet 3   Carboxymethylcellulose Sodium 0.25 % SOLN Place 1 drop into both eyes in the morning and at bedtime.     carvedilol (COREG) 12.5 MG tablet Take 1 tablet (12.5 mg total) by mouth 2 (two) times daily with a meal. 180 tablet 3   Cholecalciferol 2000 UNITS TABS Take 2,000 Units by mouth daily.     empagliflozin (JARDIANCE) 10 MG TABS tablet Take 1 tablet (10 mg total) by mouth daily. 90 tablet 3   empagliflozin (JARDIANCE) 10 MG TABS tablet Take 1 tablet (10 mg total) by mouth daily before breakfast. 14 tablet 0   ENTRESTO 49-51 MG TAKE 1 TABLET BY MOUTH TWICE DAILY 60 tablet 11   metformin (FORTAMET) 500 MG (OSM) 24 hr tablet Take 1 tablet (500 mg total) by mouth daily with breakfast. 90 tablet 1   methocarbamol (ROBAXIN) 500 MG tablet Take 1 tablet (500 mg total) by mouth at bedtime. 30 tablet 11   pantoprazole (PROTONIX) 40 MG tablet Take 1 tablet (40 mg total) by mouth 2 (two) times daily. 180 tablet 3   tamsulosin (FLOMAX) 0.4 MG CAPS capsule Take 0.4 mg by mouth.     traMADol (ULTRAM) 50 MG tablet Take 1 tablet (50 mg total) by mouth every 6 (six) hours as needed for moderate pain or severe pain. 30 tablet    zinc gluconate 50 MG tablet Take 50 mg by mouth daily.     furosemide (LASIX) 20 MG tablet Take 1 tablet (20 mg total) by mouth daily as needed (weight gain (3lbs in 1 day or 5lbs in 1 week)). 30 tablet 11   nitroGLYCERIN (NITROSTAT) 0.4 MG SL tablet Place 1 tablet (0.4 mg total) under the tongue every 5 (five) minutes as needed for chest pain. 25 tablet 2   spironolactone (ALDACTONE) 25 MG tablet TAKE 1/2 TABLET (12.5 MG TOTAL) BY MOUTH DAILY. 15 tablet 11   No current  facility-administered medications for this visit.    REVIEW OF SYSTEMS:  '[X]'$  denotes positive finding, '[ ]'$  denotes negative finding Cardiac  Comments:  Chest pain or chest pressure:    Shortness of breath upon exertion:    Short of breath when lying flat:    Irregular heart rhythm:        Vascular    Pain in calf, thigh, or hip brought on by ambulation:    Pain in feet at night that wakes you up from your sleep:     Blood clot in your veins:    Leg  swelling:         Pulmonary    Oxygen at home:    Productive cough:     Wheezing:         Neurologic    Sudden weakness in arms or legs:     Sudden numbness in arms or legs:     Sudden onset of difficulty speaking or slurred speech:    Temporary loss of vision in one eye:     Problems with dizziness:         Gastrointestinal    Blood in stool:     Vomited blood:         Genitourinary    Burning when urinating:     Blood in urine:        Psychiatric    Major depression:         Hematologic    Bleeding problems:    Problems with blood clotting too easily:        Skin    Rashes or ulcers:        Constitutional    Fever or chills:      PHYSICAL EXAM: Vitals:   11/18/21 0956 11/18/21 1001  BP: (!) 149/82 (!) 151/76  Pulse: 61 61  Resp: 18   Temp: 98.2 F (36.8 C)   TempSrc: Temporal   SpO2: 93%   Weight: 185 lb (83.9 kg)   Height: '5\' 8"'$  (1.727 m)     GENERAL: The patient is a well-nourished male, in no acute distress. The vital signs are documented above. CARDIAC: There is a regular rate and rhythm.  VASCULAR:  Palpable femoral pulses bilaterally Palpable DP pulses bilaterally PULMONARY: No respiratory distress. ABDOMEN: Soft and non-tender. MUSCULOSKELETAL: There are no major deformities or cyanosis. NEUROLOGIC: No focal weakness or paresthesias are detected.  CN II-XII grossly intact. SKIN: There are no ulcers or rashes noted. PSYCHIATRIC: The patient has a normal affect.  DATA:   CTA neck  reviewed from 11/12/2021 with high-grade proximal right internal carotid artery stenosis and <50% proximal left ICA stenosis  Carotid duplex 09/15/21 with evidence of a high-grade greater than 80% right ICA stenosis with velocity 460/151 and minimal 1 to 39% left ICA stenosis.  Assessment/Plan:  76 year old male presents for evaluation of a high-grade greater than 80% right ICA stenosis that is asymptomatic and has been followed by Dr. Gwenlyn Found.  I have reviewed his CTA neck that confirms a high-grade greater than 80% proximal right ICA stenosis.  In discussion with Dr. Gwenlyn Found, he felt his best option would likely be TCAR given his cardiac history.  After review of his CTA I think he has appropriate anatomy for TCAR with flow reversal for distal embolic protection.  I discussed the TCAR procedure with the patient.  I have asked that he start taking Plavix daily and I sent a prescription to his pharmacy.  He will need to be on aspirin Plavix statin for the procedure.  We will arrange for left TCAR next Friday on 11/28/21.  Risk benefits discussed including 1% risk of perioperative stroke.  [name], MD [facility] Office: [phone]

## 2021-11-20 ENCOUNTER — Encounter: Payer: Self-pay | Admitting: Internal Medicine

## 2021-11-20 NOTE — Progress Notes (Addendum)
Surgical Instructions    Your procedure is scheduled on Friday June 2nd .  Report to [facility] Main Entrance "A" at 5:30 A.M., then check in with the Admitting office.  Call this number if you have problems the morning of surgery:  [phone]   If you have any questions prior to your surgery date call [phone]: Open Monday-Friday 8am-4pm    Remember:  Do not eat or drink after midnight the night before your surgery     Take these medicines the morning of surgery with A SIP OF WATER: aspirin 81 MG EC tablet - double check with surgeon to see if it is ok to take day of surgery Carboxymethylcellulose Sodium 0.25 % SOLN carvedilol (COREG) 12.5 MG tablet ENTRESTO 49-51 MG pantoprazole (PROTONIX) 40 MG tablet tamsulosin (FLOMAX) 0.4 MG CAPS capsule traMADol (ULTRAM) 50 MG tablet  IF NEEDED  acetaminophen (TYLENOL) 325 MG tablet nitroGLYCERIN (NITROSTAT) 0.4 MG SL tablet  Follow your surgeon's instructions on when to stop Plavix.  If no instructions were given by your surgeon then you will need to call the office to get those instructions.     Follow your surgeon's instructions on when to stop Aspirin.  If no instructions were given by your surgeon then you will need to call the office to get those instructions.     As of today, STOP taking any Aspirin (unless otherwise instructed by your surgeon) Aleve, Naproxen, Ibuprofen, Motrin, Advil, Goody's, BC's, all herbal medications, fish oil, and all vitamins.   WHAT DO I DO ABOUT MY DIABETES MEDICATION?   Do not take oral diabetes medicines (Jardiance & Metformin) the morning of surgery.  THE DAY BEFORE SURGERY do not take your Jardiance,   TThe day of surgery, do not take other diabetes injectables, including Byetta (exenatide), Bydureon (exenatide ER), Victoza (liraglutide), or Trulicity (dulaglutide).  If your CBG is greater than 220 mg/dL, you may take  of your sliding scale (correction) dose of insulin.   HOW TO  MANAGE YOUR DIABETES BEFORE AND AFTER SURGERY  Why is it important to control my blood sugar before and after surgery? Improving blood sugar levels before and after surgery helps healing and can limit problems. A way of improving blood sugar control is eating a healthy diet by:  Eating less sugar and carbohydrates  Increasing activity/exercise  Talking with your doctor about reaching your blood sugar goals High blood sugars (greater than 180 mg/dL) can raise your risk of infections and slow your recovery, so you will need to focus on controlling your diabetes during the weeks before surgery. Make sure that the doctor who takes care of your diabetes knows about your planned surgery including the date and location.  How do I manage my blood sugar before surgery? Check your blood sugar at least 4 times a day, starting 2 days before surgery, to make sure that the level is not too high or low.  Check your blood sugar the morning of your surgery when you wake up and every 2 hours until you get to the Short Stay unit.  If your blood sugar is less than 70 mg/dL, you will need to treat for low blood sugar: Do not take insulin. Treat a low blood sugar (less than 70 mg/dL) with  cup of clear juice (cranberry or apple), 4 glucose tablets, OR glucose gel. Recheck blood sugar in 15 minutes after treatment (to make sure it is greater than 70 mg/dL). If your blood sugar is not greater than 70 mg/dL  on recheck, call [phone] for further instructions. Report your blood sugar to the short stay nurse when you get to Short Stay.  If you are admitted to the hospital after surgery: Your blood sugar will be checked by the staff and you will probably be given insulin after surgery (instead of oral diabetes medicines) to make sure you have good blood sugar levels. The goal for blood sugar control after surgery is 80-180 mg/dL.        DAY OF SURGERY    Do not wear jewelry  Do not wear lotions, powders,  colognes, or deodorant. Do not shave 48 hours prior to surgery.   Do not bring valuables to the hospital. Do not wear nail polish  [facility] is not responsible for any belongings or valuables. .   Do NOT Smoke (Tobacco/Vaping)  24 hours prior to your procedure  If you use a CPAP at night, you may bring your mask for your overnight stay.   Contacts, glasses, hearing aids, dentures or partials may not be worn into surgery, please bring cases for these belongings   For patients admitted to the hospital, discharge time will be determined by your treatment team.   Patients discharged the day of surgery will not be allowed to drive home, and someone needs to stay with them for 24 hours.   SURGICAL WAITING ROOM VISITATION Patients having surgery or a procedure in a hospital may have two support people. Children under the age of 67 must have an adult with them who is not the patient. They may stay in the waiting area during the procedure and may switch out with other visitors. If the patient needs to stay at the hospital during part of their recovery, the visitor guidelines for inpatient rooms apply.  Please refer to the [facility] website for the visitor guidelines for Inpatients (after your surgery is over and you are in a regular room).       Special instructions:    Oral Hygiene is also important to reduce your risk of infection.  Remember - BRUSH YOUR TEETH THE MORNING OF SURGERY WITH YOUR REGULAR TOOTHPASTE   [facility]- Preparing For Surgery  Before surgery, you can play an important role. Because skin is not sterile, your skin needs to be as free of germs as possible. You can reduce the number of germs on your skin by washing with CHG (chlorahexidine gluconate) Soap before surgery.  CHG is an antiseptic cleaner which kills germs and bonds with the skin to continue killing germs even after washing.     Please do not use if you have an allergy to CHG or antibacterial soaps. If  your skin becomes reddened/irritated stop using the CHG.  Do not shave (including legs and underarms) for at least 48 hours prior to first CHG shower. It is OK to shave your face.  Please follow these instructions carefully.     Shower the NIGHT BEFORE SURGERY and the MORNING OF SURGERY with CHG Soap.   If you chose to wash your hair, wash your hair first as usual with your normal shampoo. After you shampoo, rinse your hair and body thoroughly to remove the shampoo.  Then [employer] and genitals (private parts) with your normal soap and rinse thoroughly to remove soap.  After that Use CHG Soap as you would any other liquid soap. You can apply CHG directly to the skin and wash gently with a scrungie or a clean washcloth.   Apply the CHG Soap  to your body ONLY FROM THE NECK DOWN.  Do not use on open wounds or open sores. Avoid contact with your eyes, ears, mouth and genitals (private parts). Wash Face and genitals (private parts)  with your normal soap.   Wash thoroughly, paying special attention to the area where your surgery will be performed.  Thoroughly rinse your body with warm water from the neck down.  DO NOT shower/wash with your normal soap after using and rinsing off the CHG Soap.  Pat yourself dry with a CLEAN TOWEL.  Wear CLEAN PAJAMAS to bed the night before surgery  Place CLEAN SHEETS on your bed the night before your surgery  DO NOT SLEEP WITH PETS.   Day of Surgery:  Take a shower with CHG soap. Wear Clean/Comfortable clothing the morning of surgery Do not apply any deodorants/lotions.   Remember to brush your teeth WITH YOUR REGULAR TOOTHPASTE.    If you received a COVID test during your pre-op visit, it is requested that you wear a mask when out in public, stay away from anyone that may not be feeling well, and notify your surgeon if you develop symptoms. If you have been in contact with anyone that has tested positive in the last 10 days, please notify your  surgeon.    Please read over the following fact sheets that you were given.

## 2021-11-20 NOTE — Progress Notes (Unsigned)
PERIOPERATIVE PRESCRIPTION FOR IMPLANTED CARDIAC DEVICE PROGRAMMING  Patient Information: Name:  Victor Estes  DOB:  1945-11-08  MRN:  [id number]  {TIP - You do not have to delete this tip  -  Copy the info from the staff message sent by the PAT staff  then press F2 here and paste the information using CTL - V on the next line :[id number]}  Planned Procedure:  R Transcarotid artery revascularization  Surgeon:  Dr. Carlis Abbott  Date of Procedure:  November 28, 2021  Cautery will be used.  Position during surgery:  supine   Please send documentation back to:  [facility] (Fax # [phone])  Device Information:  Clinic EP Physician:  Virl Axe, MD   Device Type:  Defibrillator Manufacturer and Phone #:  [employer]/Abbott: [phone] Pacemaker Dependent?:  No. Date of Last Device Check:  09/22/21 Remote Normal Device Function?:  Yes.    Electrophysiologist's Recommendations:  Have magnet available. Provide continuous ECG monitoring when magnet is used or reprogramming is to be performed.  Procedure may interfere with device function.  Magnet should be placed over device during procedure.  Per Device Clinic Standing Orders, Wanda Plump, RN  1:45 PM 11/20/2021

## 2021-11-20 NOTE — Progress Notes (Signed)
Staff message sent to Dr. Caryl Comes a staff message for orders for [employer] ICD.  Called [employer] and left message for Windle Guard about procedure on 11/28/21 @ 0730 with a patient arrival time of 0530.  Awaiting call back to confirm Aaron Edelman received the message.

## 2021-11-21 ENCOUNTER — Telehealth: Payer: Self-pay

## 2021-11-21 ENCOUNTER — Encounter (HOSPITAL_COMMUNITY): Payer: Self-pay

## 2021-11-21 ENCOUNTER — Encounter (HOSPITAL_COMMUNITY)
Admission: RE | Admit: 2021-11-21 | Discharge: 2021-11-21 | Disposition: A | Discharge status: Home / Self Care | Payer: Medicare Other | Source: Ambulatory Visit | Attending: Vascular Surgery | Admitting: Vascular Surgery

## 2021-11-21 ENCOUNTER — Other Ambulatory Visit: Payer: Self-pay

## 2021-11-21 VITALS — BP 152/71 | HR 58 | Temp 97.7°F | Resp 18 | Ht 68.0 in | Wt 188.7 lb

## 2021-11-21 DIAGNOSIS — E119 Type 2 diabetes mellitus without complications: Secondary | ICD-10-CM | POA: Insufficient documentation

## 2021-11-21 DIAGNOSIS — Z7982 Long term (current) use of aspirin: Secondary | ICD-10-CM | POA: Insufficient documentation

## 2021-11-21 DIAGNOSIS — I6521 Occlusion and stenosis of right carotid artery: Secondary | ICD-10-CM | POA: Diagnosis not present

## 2021-11-21 DIAGNOSIS — Z01812 Encounter for preprocedural laboratory examination: Secondary | ICD-10-CM | POA: Insufficient documentation

## 2021-11-21 DIAGNOSIS — I509 Heart failure, unspecified: Secondary | ICD-10-CM | POA: Diagnosis not present

## 2021-11-21 DIAGNOSIS — I110 Hypertensive heart disease with heart failure: Secondary | ICD-10-CM | POA: Diagnosis not present

## 2021-11-21 DIAGNOSIS — Z951 Presence of aortocoronary bypass graft: Secondary | ICD-10-CM | POA: Diagnosis not present

## 2021-11-21 DIAGNOSIS — Z01818 Encounter for other preprocedural examination: Secondary | ICD-10-CM

## 2021-11-21 DIAGNOSIS — Z95810 Presence of automatic (implantable) cardiac defibrillator: Secondary | ICD-10-CM | POA: Insufficient documentation

## 2021-11-21 DIAGNOSIS — I2510 Atherosclerotic heart disease of native coronary artery without angina pectoris: Secondary | ICD-10-CM | POA: Insufficient documentation

## 2021-11-21 DIAGNOSIS — Z7902 Long term (current) use of antithrombotics/antiplatelets: Secondary | ICD-10-CM | POA: Insufficient documentation

## 2021-11-21 HISTORY — DX: Presence of automatic (implantable) cardiac defibrillator: Z95.810

## 2021-11-21 HISTORY — DX: Unspecified osteoarthritis, unspecified site: M19.90

## 2021-11-21 LAB — HEMOGLOBIN A1C
Hgb A1c MFr Bld: 7.5 % — ABNORMAL HIGH (ref 4.8–5.6)
Mean Plasma Glucose: 168.55 mg/dL

## 2021-11-21 LAB — GLUCOSE, CAPILLARY: Glucose-Capillary: 257 mg/dL — ABNORMAL HIGH (ref 70–99)

## 2021-11-21 LAB — URINALYSIS, ROUTINE W REFLEX MICROSCOPIC
Bacteria, UA: NONE SEEN
Bilirubin Urine: NEGATIVE
Glucose, UA: >=500 mg/dL — AB
Ketones, ur: NEGATIVE mg/dL
Leukocytes,Ua: NEGATIVE
Nitrite: NEGATIVE
Protein, ur: NEGATIVE mg/dL
Specific Gravity, Urine: 1.033 — ABNORMAL HIGH (ref 1.005–1.030)
pH: 5 (ref 5.0–8.0)

## 2021-11-21 LAB — APTT: aPTT: 27 seconds (ref 24–36)

## 2021-11-21 LAB — CBC
HCT: 46.2 % (ref 39.0–52.0)
Hemoglobin: 15.4 g/dL (ref 13.0–17.0)
MCH: 30.6 pg (ref 26.0–34.0)
MCHC: 33.3 g/dL (ref 30.0–36.0)
MCV: 91.8 fL (ref 80.0–100.0)
Platelets: 218 10*3/uL (ref 150–400)
RBC: 5.03 MIL/uL (ref 4.22–5.81)
RDW: 13.3 % (ref 11.5–15.5)
WBC: 7.8 10*3/uL (ref 4.0–10.5)
nRBC: 0 % (ref 0.0–0.2)

## 2021-11-21 LAB — COMPREHENSIVE METABOLIC PANEL
ALT: 22 U/L (ref 0–44)
AST: 22 U/L (ref 15–41)
Albumin: 3.6 g/dL (ref 3.5–5.0)
Alkaline Phosphatase: 92 U/L (ref 38–126)
Anion gap: 7 (ref 5–15)
BUN: 16 mg/dL (ref 8–23)
CO2: 23 mmol/L (ref 22–32)
Calcium: 8.8 mg/dL — ABNORMAL LOW (ref 8.9–10.3)
Chloride: 107 mmol/L (ref 98–111)
Creatinine, Ser: 1 mg/dL (ref 0.61–1.24)
GFR, Estimated: >60 mL/min (ref >60)
Glucose, Bld: 221 mg/dL — ABNORMAL HIGH (ref 70–99)
Potassium: 3.4 mmol/L — ABNORMAL LOW (ref 3.5–5.1)
Sodium: 137 mmol/L (ref 135–145)
Total Bilirubin: 0.6 mg/dL (ref 0.3–1.2)
Total Protein: 6.3 g/dL — ABNORMAL LOW (ref 6.5–8.1)

## 2021-11-21 LAB — PROTIME-INR
INR: 0.9 (ref 0.8–1.2)
Prothrombin Time: 12.3 seconds (ref 11.4–15.2)

## 2021-11-21 LAB — TYPE AND SCREEN
ABO/RH(D): A POS
Antibody Screen: NEGATIVE

## 2021-11-21 LAB — SURGICAL PCR SCREEN
MRSA, PCR: POSITIVE — AB
Staphylococcus aureus: POSITIVE — AB

## 2021-11-21 NOTE — Progress Notes (Signed)
[employer] sent to Dr. Carlis Abbott to notify of patient's PCR positive for MRSA. Also called the office and spoke to [place] who will also contact Dr. Carlis Abbott.

## 2021-11-21 NOTE — Telephone Encounter (Signed)
PAT called to let us know pt had MRSA and Staph positive results. MD has been made aware via staff message.

## 2021-11-21 NOTE — Progress Notes (Addendum)
Surgical Instructions    Your procedure is scheduled on Friday June 2nd .  Report to [facility] Main Entrance "A" at 5:30 A.M., then check in with the Admitting office.  Call this number if you have problems the morning of surgery:  [phone]   If you have any questions prior to your surgery date call [phone]: Open Monday-Friday 8am-4pm    Remember:  Do not eat or drink after midnight the night before your surgery     Take these medicines the morning of surgery with A SIP OF WATER: aspirin 81 MG EC tablet - double check with surgeon to see if it is ok to take day of surgery Carboxymethylcellulose Sodium 0.25 % SOLN carvedilol (COREG) 12.5 MG tablet ENTRESTO 49-51 MG pantoprazole (PROTONIX) 40 MG tablet tamsulosin (FLOMAX) 0.4 MG CAPS capsule   IF NEEDED  acetaminophen (TYLENOL) 325 MG tablet nitroGLYCERIN (NITROSTAT) 0.4 MG SL tablet traMADol (ULTRAM) 50 MG tablet  Follow your surgeon's instructions on when to stop Plavix.  If no instructions were given by your surgeon then you will need to call the office to get those instructions.     Follow your surgeon's instructions on when to stop Aspirin.  If no instructions were given by your surgeon then you will need to call the office to get those instructions.     As of today, STOP taking any Aleve, Naproxen, Ibuprofen, Motrin, Advil, Goody's, BC's, all herbal medications, fish oil, meloxicam (MOBIC), and all vitamins.   WHAT DO I DO ABOUT MY DIABETES MEDICATION?   Do not take oral diabetes medicines (Jardiance & Metformin) the morning of surgery.  THE DAY BEFORE SURGERY do not take your Jardiance,   TThe day of surgery, do not take other diabetes injectables, including Byetta (exenatide), Bydureon (exenatide ER), Victoza (liraglutide), or Trulicity (dulaglutide).  If your CBG is greater than 220 mg/dL, you may take  of your sliding scale (correction) dose of insulin.   HOW TO MANAGE YOUR DIABETES BEFORE AND  AFTER SURGERY  Why is it important to control my blood sugar before and after surgery? Improving blood sugar levels before and after surgery helps healing and can limit problems. A way of improving blood sugar control is eating a healthy diet by:  Eating less sugar and carbohydrates  Increasing activity/exercise  Talking with your doctor about reaching your blood sugar goals High blood sugars (greater than 180 mg/dL) can raise your risk of infections and slow your recovery, so you will need to focus on controlling your diabetes during the weeks before surgery. Make sure that the doctor who takes care of your diabetes knows about your planned surgery including the date and location.  How do I manage my blood sugar before surgery? Check your blood sugar at least 4 times a day, starting 2 days before surgery, to make sure that the level is not too high or low.  Check your blood sugar the morning of your surgery when you wake up and every 2 hours until you get to the Short Stay unit.  If your blood sugar is less than 70 mg/dL, you will need to treat for low blood sugar: Do not take insulin. Treat a low blood sugar (less than 70 mg/dL) with  cup of clear juice (cranberry or apple), 4 glucose tablets, OR glucose gel. Recheck blood sugar in 15 minutes after treatment (to make sure it is greater than 70 mg/dL). If your blood sugar is not greater than 70 mg/dL on recheck, call [phone]  for further instructions. Report your blood sugar to the short stay nurse when you get to Short Stay.  If you are admitted to the hospital after surgery: Your blood sugar will be checked by the staff and you will probably be given insulin after surgery (instead of oral diabetes medicines) to make sure you have good blood sugar levels. The goal for blood sugar control after surgery is 80-180 mg/dL.        DAY OF SURGERY    Do not wear jewelry  Do not wear lotions, powders, colognes, or deodorant. Do not shave  48 hours prior to surgery.   Do not bring valuables to the hospital. Do not wear nail polish  [facility] is not responsible for any belongings or valuables. .   Do NOT Smoke (Tobacco/Vaping)  24 hours prior to your procedure  If you use a CPAP at night, you may bring your mask for your overnight stay.   Contacts, glasses, hearing aids, dentures or partials may not be worn into surgery, please bring cases for these belongings   For patients admitted to the hospital, discharge time will be determined by your treatment team.   Patients discharged the day of surgery will not be allowed to drive home, and someone needs to stay with them for 24 hours.   SURGICAL WAITING ROOM VISITATION Patients having surgery or a procedure in a hospital may have two support people. Children under the age of 90 must have an adult with them who is not the patient. They may stay in the waiting area during the procedure and may switch out with other visitors. If the patient needs to stay at the hospital during part of their recovery, the visitor guidelines for inpatient rooms apply.  Please refer to the [facility] website for the visitor guidelines for Inpatients (after your surgery is over and you are in a regular room).       Special instructions:    Oral Hygiene is also important to reduce your risk of infection.  Remember - BRUSH YOUR TEETH THE MORNING OF SURGERY WITH YOUR REGULAR TOOTHPASTE   [place]- Preparing For Surgery  Before surgery, you can play an important role. Because skin is not sterile, your skin needs to be as free of germs as possible. You can reduce the number of germs on your skin by washing with CHG (chlorahexidine gluconate) Soap before surgery.  CHG is an antiseptic cleaner which kills germs and bonds with the skin to continue killing germs even after washing.     Please do not use if you have an allergy to CHG or antibacterial soaps. If your skin becomes reddened/irritated  stop using the CHG.  Do not shave (including legs and underarms) for at least 48 hours prior to first CHG shower. It is OK to shave your face.  Please follow these instructions carefully.     Shower the NIGHT BEFORE SURGERY and the MORNING OF SURGERY with CHG Soap.   If you chose to wash your hair, wash your hair first as usual with your normal shampoo. After you shampoo, rinse your hair and body thoroughly to remove the shampoo.  Then [employer] and genitals (private parts) with your normal soap and rinse thoroughly to remove soap.  After that Use CHG Soap as you would any other liquid soap. You can apply CHG directly to the skin and wash gently with a scrungie or a clean washcloth.   Apply the CHG Soap to your body ONLY  FROM THE NECK DOWN.  Do not use on open wounds or open sores. Avoid contact with your eyes, ears, mouth and genitals (private parts). Wash Face and genitals (private parts)  with your normal soap.   Wash thoroughly, paying special attention to the area where your surgery will be performed.  Thoroughly rinse your body with warm water from the neck down.  DO NOT shower/wash with your normal soap after using and rinsing off the CHG Soap.  Pat yourself dry with a CLEAN TOWEL.  Wear CLEAN PAJAMAS to bed the night before surgery  Place CLEAN SHEETS on your bed the night before your surgery  DO NOT SLEEP WITH PETS.   Day of Surgery:  Take a shower with CHG soap. Wear Clean/Comfortable clothing the morning of surgery Do not apply any deodorants/lotions.   Remember to brush your teeth WITH YOUR REGULAR TOOTHPASTE.    If you received a COVID test during your pre-op visit, it is requested that you wear a mask when out in public, stay away from anyone that may not be feeling well, and notify your surgeon if you develop symptoms. If you have been in contact with anyone that has tested positive in the last 10 days, please notify your surgeon.    Please read over the  following fact sheets that you were given.

## 2021-11-21 NOTE — Progress Notes (Signed)
PCP - Dr. Sharmaine Base Cardiologist - Dr. Quay Burow  PPM/ICD - ICD Device Orders - Device Information:   Clinic EP Physician:  Virl Axe, MD    Device Type:  Defibrillator Manufacturer and Phone #:  [employer]: [phone] Pacemaker Dependent?:  No. Date of Last Device Check:  09/22/21 Remote         Normal Device Function?:  Yes.     Electrophysiologist's Recommendations:   Have magnet available. Provide continuous ECG monitoring when magnet is used or reprogramming is to be performed.  Procedure may interfere with device function.  Magnet should be placed over device during procedure. Rep Notified - Windle Guard with [employer]  Chest x-ray - 09/22/20 EKG - 10/22/21 Stress Test - 2014 ECHO - 10/30/21 Cardiac Cath - 04/27/15  Sleep Study - denies CPAP - n/a  CBG today- 257 Patient does not check his blood sugar at home Patient states he ate a [employer] with syrup and had a cup of sweet tea this morning for breakfast. Patient aware to monitor food intake and check blood sugar if possible in days leading up to surgery.   Patient states he takes Jardiance and Metformin regularly.    Blood Thinner Instructions: Patient states he was instructed to continue taking Aspirin and Plavix.   ERAS Protcol - No. NPO  Anesthesia review: Yes.   Patient denies shortness of breath, fever, cough and chest pain at PAT appointment   All instructions explained to the patient, with a verbal understanding of the material. Patient agrees to go over the instructions while at home for a better understanding. Patient also instructed to self quarantine after being tested for COVID-19. The opportunity to ask questions was provided.

## 2021-11-25 NOTE — Progress Notes (Signed)
Anesthesia Chart Review:   Case: [id number] Date/Time: 11/28/21 0715   Procedure: Transcarotid Artery Revascularization (Right)   Anesthesia type: General   Pre-op diagnosis: Right carotid artery stenosis   Location: [facility] OR ROOM 16 / [place] OR   Surgeons: [name], MD       DISCUSSION: Pt is 76 years old with hx CAD (s/p CABG 1995), HTN, carotid artery disease, ischemic cardiomyopathy, CHF (EF 30-35%), sustained Vtach requiring cardioversion (s/p ICD implanted 04/29/15)  Plavix and ASA to be continued perioperatively   Perioperative prescription for ICD documents:  Have magnet available. Provide continuous ECG monitoring when magnet is used or reprogramming is to be performed.  Procedure may interfere with device function.  Magnet should be placed over device during procedure.   VS: BP (!) 152/71   Pulse (!) 58   Temp 36.5 C (Oral)   Resp 18   Ht '5\' 8"'$  (1.727 m)   Wt 85.6 kg   SpO2 98%   BMI 28.69 kg/m   PROVIDERS: - Primary care at [facility] is Quay Burow, MD - EP cardiologist is Virl Axe, MD  LABS:  - Glucose 221.   (all labs ordered are listed, but only abnormal results are displayed)  Labs Reviewed  SURGICAL PCR SCREEN - Abnormal; Notable for the following components:      Result Value   MRSA, PCR POSITIVE (*)    Staphylococcus aureus POSITIVE (*)    All other components within normal limits  COMPREHENSIVE METABOLIC PANEL - Abnormal; Notable for the following components:   Potassium 3.4 (*)    Glucose, Bld 221 (*)    Calcium 8.8 (*)    Total Protein 6.3 (*)    All other components within normal limits  URINALYSIS, ROUTINE W REFLEX MICROSCOPIC - Abnormal; Notable for the following components:   Specific Gravity, Urine 1.033 (*)    Glucose, UA >=500 (*)    Hgb urine dipstick SMALL (*)    All other components within normal limits  GLUCOSE, CAPILLARY - Abnormal; Notable for the following components:   Glucose-Capillary  257 (*)    All other components within normal limits  HEMOGLOBIN A1C - Abnormal; Notable for the following components:   Hgb A1c MFr Bld 7.5 (*)    All other components within normal limits  CBC  PROTIME-INR  APTT  TYPE AND SCREEN     IMAGES: CT angio neck 11/12/21:  - Widespread atherosclerosis with notable stenoses including: *80% or greater at the proximal right ICA. *50% at the proximal left ICA. *70% at the proximal right subclavian artery. *60% at the bilateral V1 segments.   EKG 10/22/21:  sinus bradycardia 56 with first-degree AV block and borderline voltage for LVH.  Inferior Q waves    CV: Echo 10/30/21:  1. Left ventricular ejection fraction, by estimation, is 30 to 35%. The left ventricle has moderately decreased function. The left ventricle demonstrates regional wall motion abnormalities (see scoring diagram/findings for description). The left ventricular internal cavity size was moderately to severely dilated. Left ventricular diastolic parameters are consistent with Grade II diastolic dysfunction (pseudonormalization). There is akinesis of the left ventricular, basal-mid inferoseptal wall and anteroseptal wall. There is akinesis of the left ventricular, entire inferior wall.  2. Right ventricular systolic function is normal. The right ventricular size is normal. There is normal pulmonary artery systolic pressure. The estimated right ventricular systolic pressure is 19.4 mmHg.  3. Left atrial size was moderately dilated.  4. The mitral  valve is normal in structure. Mild to moderate mitral valve regurgitation. No evidence of mitral stenosis.  5. The aortic valve is tricuspid. Aortic valve regurgitation is mild. Aortic valve sclerosis/calcification is present, without any evidence of aortic stenosis. Aortic regurgitation PHT measures 854 msec.  6. The inferior vena cava is normal in size with greater than 50% respiratory variability, suggesting right atrial pressure of 3 mmHg.    R/L cardiac cath 09/24/20:  Mid LM to Dist LM lesion is 95% stenosed. Ost Cx lesion is 95% stenosed. Dist LM to Ost LAD lesion is 80% stenosed. Mid LAD lesion is 100% stenosed. Mid Cx lesion is 95% stenosed. 2nd Mrg lesion is 100% stenosed. Ost RCA to Prox RCA lesion is 60% stenosed. Prox RCA to Mid RCA lesion is 85% stenosed. RPAV lesion is 100% stenosed. Origin lesion is 100% stenosed. - Severe multivessel native CAD with 95% distal left main stenosis, 80% ostial LAD stenosis with total occlusion of the mid LAD; 95% ostial circumflex stenosis with 95% mid stenosis and occluded OM branch; 60% proximal RCA with diffuse 80 to 85% mid RCA stenoses.  The continuation of the branch of the RCA after the PDA takeoff appears occluded.  There is collateralization to the PL vessel via the circumflex coronary artery. - Patent sequential LIMA graft supplying the diagonal vessel in the mid LAD. - Patent SVG supplying the large OM 2 vessel of the circumflex coronary artery. - Old occlusion of the SVG which had supplied the distal RCA.   Past Medical History:  Diagnosis Date   AICD (automatic cardioverter/defibrillator) present    Arthritis    Cancer ([place])    Carotid artery disease [place])    GERD (gastroesophageal reflux disease)    Hyperlipemia    Hypertension    Dr Gwenlyn Found   Myocardial infarct [facility])    CABG-1995- LIMA-D1-LAD, SVG-OM, SVG-RCA    Ventricular tachycardia, sustained ([place]) 04/27/2015    Past Surgical History:  Procedure Laterality Date   BACK SURGERY     CARDIAC CATHETERIZATION N/A 04/27/2015   Severe native dz, LIMA-LAD & SVG-OM OK, SVG-RCA chronically occluded. Left Heart Cath and Coronary Angiography;  Surgeon: Sherren Mocha, MD;  Location: [place] CV LAB;  Service: Cardiovascular;  Laterality: N/A;   CHOLECYSTECTOMY     COLON SURGERY     CORONARY ARTERY BYPASS GRAFT  1995    LIMA-D1-LAD, SVG-OM, SVG-RCA, Dr Redmond Pulling   ELBOW SURGERY     EP IMPLANTABLE DEVICE N/A  04/29/2015   Procedure: ICD Implant;  Surgeon: Deboraha Sprang, MD;  Location: [place] [facility] LAB;  Service: Cardiovascular;  Laterality: N/A; [employer] Jude  ICD, serial number  [id number].   LUMBAR LAMINECTOMY/DECOMPRESSION MICRODISCECTOMY  09/09/2011   Procedure: LUMBAR LAMINECTOMY/DECOMPRESSION MICRODISCECTOMY 1 LEVEL;  Surgeon: Eustace Moore, MD;  Location: [place] [facility] ORS;  Service: Neurosurgery;  Laterality: Right;  Right Lumbar Five-Sacral One Hemilaminectomy    RIGHT/LEFT HEART CATH AND CORONARY/GRAFT ANGIOGRAPHY N/A 09/24/2020   Procedure: RIGHT/LEFT HEART CATH AND CORONARY/GRAFT ANGIOGRAPHY;  Surgeon: Troy Sine, MD;  Location: [place] CV LAB;  Service: Cardiovascular;  Laterality: N/A;   TOTAL KNEE ARTHROPLASTY Left 07/26/2013   Procedure: TOTAL KNEE ARTHROPLASTY;  Surgeon: Ninetta Lights, MD;  Location: [place];  Service: Orthopedics;  Laterality: Left;    MEDICATIONS:  acetaminophen (TYLENOL) 325 MG tablet   Alum Hydroxide-Mag Carbonate (ACID GONE ANTACID PO)   amLODipine (NORVASC) 5 MG tablet   aspirin 81 MG EC tablet   atorvastatin (LIPITOR) 80  MG tablet   Carboxymethylcellulose Sodium 0.25 % SOLN   carvedilol (COREG) 12.5 MG tablet   Cholecalciferol (VITAMIN D3) 50 MCG (2000 UT) TABS   clopidogrel (PLAVIX) 75 MG tablet   empagliflozin (JARDIANCE) 10 MG TABS tablet   ENTRESTO 49-51 MG   ezetimibe (ZETIA) 10 MG tablet   furosemide (LASIX) 20 MG tablet   meloxicam (MOBIC) 7.5 MG tablet   metformin (FORTAMET) 500 MG (OSM) 24 hr tablet   methocarbamol (ROBAXIN) 500 MG tablet   nitroGLYCERIN (NITROSTAT) 0.4 MG SL tablet   pantoprazole (PROTONIX) 40 MG tablet   spironolactone (ALDACTONE) 25 MG tablet   tamsulosin (FLOMAX) 0.4 MG CAPS capsule   traMADol (ULTRAM) 50 MG tablet   No current facility-administered medications for this encounter.   - Plavix and ASA to be continued perioperatively   If no changes, I anticipate pt can proceed with surgery as scheduled.   Willeen Cass, PhD, FNP-BC [facility] Short Stay Surgical Center/Anesthesiology Phone: [phone] 11/25/2021 9:19 AM

## 2021-11-25 NOTE — Anesthesia Preprocedure Evaluation (Signed)
Anesthesia Evaluation  Patient identified by MRN, date of birth, ID band Patient awake    Reviewed: Allergy & Precautions, NPO status , Patient's Chart, lab work & pertinent test results, reviewed documented beta blocker date and time   Airway Mallampati: II  TM Distance: >3 FB Neck ROM: Full    Dental no notable dental hx.    Pulmonary former smoker,    Pulmonary exam normal breath sounds clear to auscultation       Cardiovascular hypertension, Pt. on home beta blockers and Pt. on medications + CAD, + Past MI and +CHF  Normal cardiovascular exam+ dysrhythmias + Cardiac Defibrillator + Valvular Problems/Murmurs AI and MR  Rhythm:Regular Rate:Normal  Echo 10/2021 1. Left ventricular ejection fraction, by estimation, is 30 to 35%. The left ventricle has moderately decreased function. The left ventricle demonstrates regional wall motion abnormalities (see scoring diagram/findings for description). The left ventricular internal cavity size was moderately to severely dilated. Left ventricular diastolic parameters are consistent with Grade II diastolic dysfunction (pseudonormalization). There is akinesis of the left ventricular, basal-mid inferoseptal wall and anteroseptal wall. There is akinesis of the left ventricular, entire inferior wall.  2. Right ventricular systolic function is normal. The right ventricular size is normal. There is normal pulmonary artery systolic pressure. The estimated right ventricular systolic pressure is 51.7 mmHg.  3. Left atrial size was moderately dilated.  4. The mitral valve is normal in structure. Mild to moderate mitral valve regurgitation. No evidence of mitral stenosis.  5. The aortic valve is tricuspid. Aortic valve regurgitation is mild. Aortic valve sclerosis/calcification is present, without any evidence of aortic stenosis. Aortic regurgitation PHT measures 854 msec.  6. The inferior vena cava is  normal in size with greater than 50% respiratory variability, suggesting right atrial pressure of 3 mmHg.    LHC 08/2020 ? Mid LM to Dist LM lesion is 95% stenosed. ? Ost Cx lesion is 95% stenosed. ? Dist LM to Ost LAD lesion is 80% stenosed. ? Mid LAD lesion is 100% stenosed. ? Mid Cx lesion is 95% stenosed. ? 2nd Mrg lesion is 100% stenosed. ? Ost RCA to Prox RCA lesion is 60% stenosed. ? Prox RCA to Mid RCA lesion is 85% stenosed. ? RPAV lesion is 100% stenosed. ? Origin lesion is 100% stenosed.   Severe multivessel native CAD with 95% distal left main stenosis, 80% ostial LAD stenosis with total occlusion of the mid LAD; 95% ostial circumflex stenosis with 95% mid stenosis and occluded OM branch; 60% proximal RCA with diffuse 80 to 85% mid RCA stenoses.  The continuation of the branch of the RCA after the PDA takeoff appears occluded.  There is collateralization to the PL vessel via the circumflex coronary artery.  Patent sequential LIMA graft supplying the diagonal vessel in the mid LAD.  Patent SVG supplying the large OM 2 vessel of the circumflex coronary artery.  Old occlusion of the SVG which had supplied the distal RCA.  RECOMMENDATION: Guideline directed medical therapy for HFrEF.  Probable continue long-term DAPT therapy if patient was on Plavix prior to admission.  Aggressive lipid-lowering therapy with target LDL less than 70 and preferably 50 or below.    Neuro/Psych negative neurological ROS     GI/Hepatic Neg liver ROS, GERD  ,  Endo/Other  diabetes  Renal/GU negative Renal ROS     Musculoskeletal  (+) Arthritis ,   Abdominal   Peds  Hematology negative hematology ROS (+)   Anesthesia Other Findings   Reproductive/Obstetrics  Anesthesia Physical Anesthesia Plan  ASA: 3  Anesthesia Plan: General   Post-op Pain Management: Tylenol PO (pre-op)* and Minimal or no pain anticipated   Induction:  Intravenous  PONV Risk Score and Plan: 2 and Ondansetron, Dexamethasone and Treatment may vary due to age or medical condition  Airway Management Planned: Oral ETT  Additional Equipment: Arterial line  Intra-op Plan:   Post-operative Plan: Extubation in OR  Informed Consent: I have reviewed the patients History and Physical, chart, labs and discussed the procedure including the risks, benefits and alternatives for the proposed anesthesia with the patient or authorized representative who has indicated his/her understanding and acceptance.     Dental advisory given  Plan Discussed with: CRNA  Anesthesia Plan Comments: (See APP note by Durel Salts, FNP )      Anesthesia Quick Evaluation

## 2021-11-26 ENCOUNTER — Telehealth: Payer: Self-pay | Admitting: Cardiovascular Disease

## 2021-11-26 ENCOUNTER — Encounter: Payer: Self-pay | Admitting: Internal Medicine

## 2021-11-26 ENCOUNTER — Ambulatory Visit (INDEPENDENT_AMBULATORY_CARE_PROVIDER_SITE_OTHER): Payer: Medicare Other | Admitting: Internal Medicine

## 2021-11-26 VITALS — BP 140/62 | HR 51 | Ht 68.0 in | Wt 186.6 lb

## 2021-11-26 DIAGNOSIS — I255 Ischemic cardiomyopathy: Secondary | ICD-10-CM

## 2021-11-26 DIAGNOSIS — I472 Ventricular tachycardia, unspecified: Secondary | ICD-10-CM

## 2021-11-26 DIAGNOSIS — Z95810 Presence of automatic (implantable) cardiac defibrillator: Secondary | ICD-10-CM

## 2021-11-26 NOTE — Progress Notes (Signed)
Patient Care Team: [place] as PCP - General (General Practice) Lorretta Harp, MD as PCP - Cardiology (Cardiology) Deboraha Sprang, MD as PCP - Electrophysiology (Cardiology)   HPI  Victor Estes is a 76 y.o. male Seen after a long hiatus for an ICD implanted 10/16 for hemodynamically unstable VT in the setting of coronary artery disease with prior bypass surgery.  Repeat catheterization at that time demonstrated native and graft disease but not thought amenable to revascularization.    Recently found to have significant carotid disease and is scheduled for transcarotid artery revascularization (with flow reversal for distal protection)  of his R ICA  Recent hospitalization for heart failure with a 16 pound diuresis; otherwise denies chest pain, edema, abdominal distention.  Chronic two-pillow orthopnea but no nocturnal dyspnea.  No syncope or palpitations.  3/22 LHC  3VCAD LIMA-LAD-D1 patent SVG-OM2patent SVG-RCA 100%  3/22 Myoview   Inferior fixed defect  5/23 Echo  30-35% WMA  LAE mod        Date Cr K Hgb  5/23 1.0 3.4 15.4           Records and Results Reviewed we have was  Past Medical History:  Diagnosis Date   AICD (automatic cardioverter/defibrillator) present    Arthritis    Cancer ([place])    Carotid artery disease ([place])    GERD (gastroesophageal reflux disease)    Hyperlipemia    Hypertension    Dr Gwenlyn Found   Myocardial infarct [facility])    CABG-1995- LIMA-D1-LAD, SVG-OM, SVG-RCA    Ventricular tachycardia, sustained ([place]) 04/27/2015    Past Surgical History:  Procedure Laterality Date   BACK SURGERY     CARDIAC CATHETERIZATION N/A 04/27/2015   Severe native dz, LIMA-LAD & SVG-OM OK, SVG-RCA chronically occluded. Left Heart Cath and Coronary Angiography;  Surgeon: Sherren Mocha, MD;  Location: [place] [facility];  Service: Cardiovascular;  Laterality: N/A;   CHOLECYSTECTOMY     COLON SURGERY     CORONARY ARTERY BYPASS GRAFT  1995     LIMA-D1-LAD, SVG-OM, SVG-RCA, Dr Redmond Pulling   ELBOW SURGERY     EP IMPLANTABLE DEVICE N/A 04/29/2015   Procedure: ICD Implant;  Surgeon: Deboraha Sprang, MD;  Location: [place] [facility];  Service: Cardiovascular;  Laterality: N/A; St Jude  ICD, serial number  [id number].   LUMBAR LAMINECTOMY/DECOMPRESSION MICRODISCECTOMY  09/09/2011   Procedure: LUMBAR LAMINECTOMY/DECOMPRESSION MICRODISCECTOMY 1 LEVEL;  Surgeon: Eustace Moore, MD;  Location: [place] [facility];  Service: Neurosurgery;  Laterality: Right;  Right Lumbar Five-Sacral One Hemilaminectomy    RIGHT/LEFT HEART CATH AND CORONARY/GRAFT ANGIOGRAPHY N/A 09/24/2020   Procedure: RIGHT/LEFT HEART CATH AND CORONARY/GRAFT ANGIOGRAPHY;  Surgeon: Troy Sine, MD;  Location: [place] [facility];  Service: Cardiovascular;  Laterality: N/A;   TOTAL KNEE ARTHROPLASTY Left 07/26/2013   Procedure: TOTAL KNEE ARTHROPLASTY;  Surgeon: Ninetta Lights, MD;  Location: [place];  Service: Orthopedics;  Laterality: Left;    Current Meds  Medication Sig   acetaminophen (TYLENOL) 325 MG tablet Take 650 mg by mouth every 6 (six) hours as needed for mild pain, moderate pain, fever or headache.   Alum Hydroxide-Mag Carbonate (ACID GONE ANTACID PO) Take 1 tablet by mouth 3 (three) times daily as needed (acid reflux).   amLODipine (NORVASC) 5 MG tablet Take 5 mg by mouth every morning.   aspirin 81 MG EC tablet Take 1 tablet (81 mg total) by mouth daily with breakfast.  atorvastatin (LIPITOR) 80 MG tablet Take 1 tablet (80 mg total) by mouth at bedtime.   Carboxymethylcellulose Sodium 0.25 % SOLN Place 1 drop into both eyes 2 (two) times daily as needed (dry eyes).   carvedilol (COREG) 12.5 MG tablet Take 1 tablet (12.5 mg total) by mouth 2 (two) times daily with a meal.   Cholecalciferol (VITAMIN D3) 50 MCG (2000 UT) TABS Take 2,000 Units by mouth every morning.   clopidogrel (PLAVIX) 75 MG tablet Take 1 tablet (75 mg total) by mouth daily.   empagliflozin (JARDIANCE) 10 MG  TABS tablet Take 1 tablet (10 mg total) by mouth daily.   ENTRESTO 49-51 MG TAKE 1 TABLET BY MOUTH TWICE DAILY (Patient taking differently: 1 tablet 2 (two) times daily.)   ezetimibe (ZETIA) 10 MG tablet Take 10 mg by mouth daily.   meloxicam (MOBIC) 7.5 MG tablet Take 7.5 mg by mouth daily as needed for pain.   metformin (FORTAMET) 500 MG (OSM) 24 hr tablet Take 1 tablet (500 mg total) by mouth daily with breakfast.   methocarbamol (ROBAXIN) 500 MG tablet Take 1 tablet (500 mg total) by mouth at bedtime.   pantoprazole (PROTONIX) 40 MG tablet Take 1 tablet (40 mg total) by mouth 2 (two) times daily.   spironolactone (ALDACTONE) 25 MG tablet TAKE 1/2 TABLET (12.5 MG TOTAL) BY MOUTH DAILY.   tamsulosin (FLOMAX) 0.4 MG CAPS capsule Take 0.4 mg by mouth daily after breakfast.   traMADol (ULTRAM) 50 MG tablet Take 1 tablet (50 mg total) by mouth every 6 (six) hours as needed for moderate pain or severe pain.    Allergies  Allergen Reactions   Clarithromycin Swelling      Review of Systems negative except from HPI and PMH  Physical Exam BP 140/62 (BP Location: Left Arm, Patient Position: Sitting, Cuff Size: Normal)   Pulse (!) 51   Ht '5\' 8"'$  (1.727 m)   Wt 186 lb 9.6 oz (84.6 kg)   SpO2 93%   BMI 28.37 kg/m  Well developed and well nourished in no acute distress HENT normal E scleral and icterus clear Neck Supple JVP flat; carotids brisk and full Clear to ausculation Regular rate and rhythm, no murmurs gallops or rub Soft with active bowel sounds No clubbing cyanosis  Edema Alert and oriented, grossly normal motor and sensory function Skin Warm and Dry  ECG sinus 51 36/11/47  Estimated Creatinine Clearance: 66.6 mL/min (by C-G formula based on SCr of 1 mg/dL).   Assessment and  Plan Ventricular tachycardia     Implantable defibrillator- [employer]    Ischemic cardiomyopathy with prior bypass surgery    Sinus bradycardia/first-degree AV block  Hypertension  Carotid  artery disease  Preoperative evaluation    Blood pressure remains elevated.  In anticipation of his carotid surgery will not make any adjustments, I have asked him to use his blood pressure machine at home and he is to see his primary care physician within the next month at the [place] and to take his machine with him.  I suspect he will need Augmentin therapy.  His sinus bradycardia seems to be nonlimiting, he is still working full-time as a Development worker, community.  Euvolemic.  Following in the hospital recent diuresis.  Continue on Jardiance 10 and furosemide 20 as needed  With his cardiomyopathy he remains on amlodipine 5 DAPT, Entresto 49/51 and Aldactone 25  The patient's device was interrogated.  The information was reviewed. No changes were made in the programming.  No contraindications from EP point of view for his surgery     Current medicines are reviewed at length with the patient today .  The patient does not  have concerns regarding medicines.

## 2021-11-26 NOTE — Patient Instructions (Signed)
Medication Instructions:  Your physician recommends that you continue on your current medications as directed. Please refer to the Current Medication list given to you today.  *If you need a refill on your cardiac medications before your next appointment, please call your pharmacy*   Lab Work: None ordered.  If you have labs (blood work) drawn today and your tests are completely normal, you will receive your results only by: [place] (if you have MyChart) OR A paper copy in the mail If you have any lab test that is abnormal or we need to change your treatment, we will call you to review the results.   Testing/Procedures: None ordered.    Follow-Up: At [facility], you and your health needs are our priority.  As part of our continuing mission to provide you with exceptional heart care, we have created designated Provider Care Teams.  These Care Teams include your primary Cardiologist (physician) and Advanced Practice Providers (APPs -  Physician Assistants and Nurse Practitioners) who all work together to provide you with the care you need, when you need it.  We recommend signing up for the patient portal called "MyChart".  Sign up information is provided on this After Visit Summary.  MyChart is used to connect with patients for Virtual Visits (Telemedicine).  Patients are able to view lab/test results, encounter notes, upcoming appointments, etc.  Non-urgent messages can be sent to your provider as well.   To learn more about what you can do with MyChart, go to [email or web address].    Your next appointment:   12 months with Oda Kilts, PA -C or Tommye Standard, [place]  Important Information About Sugar

## 2021-11-26 NOTE — Telephone Encounter (Signed)
New Message:      She said they need authorization from Dr Gwenlyn Found to  get TCAR.

## 2021-11-27 ENCOUNTER — Other Ambulatory Visit: Payer: Self-pay

## 2021-11-28 ENCOUNTER — Inpatient Hospital Stay (HOSPITAL_COMMUNITY): Payer: Medicare Other

## 2021-11-28 ENCOUNTER — Other Ambulatory Visit: Payer: Self-pay

## 2021-11-28 ENCOUNTER — Encounter (HOSPITAL_COMMUNITY): Payer: Self-pay | Admitting: Vascular Surgery

## 2021-11-28 ENCOUNTER — Encounter (HOSPITAL_COMMUNITY)
Admission: RE | Disposition: A | Discharge status: Home / Self Care | Payer: Self-pay | Source: Home / Self Care | Attending: Vascular Surgery

## 2021-11-28 ENCOUNTER — Inpatient Hospital Stay (HOSPITAL_COMMUNITY)
Admission: RE | Admit: 2021-11-28 | Discharge: 2021-11-29 | DRG: 035 | Disposition: A | Discharge status: Home / Self Care | Payer: Medicare Other | Attending: Vascular Surgery | Admitting: Vascular Surgery

## 2021-11-28 ENCOUNTER — Inpatient Hospital Stay (HOSPITAL_COMMUNITY): Payer: Medicare Other | Admitting: Anesthesiology

## 2021-11-28 ENCOUNTER — Inpatient Hospital Stay (HOSPITAL_COMMUNITY): Payer: Medicare Other | Admitting: Emergency Medicine

## 2021-11-28 DIAGNOSIS — K219 Gastro-esophageal reflux disease without esophagitis: Secondary | ICD-10-CM | POA: Diagnosis present

## 2021-11-28 DIAGNOSIS — Z951 Presence of aortocoronary bypass graft: Secondary | ICD-10-CM

## 2021-11-28 DIAGNOSIS — Z87891 Personal history of nicotine dependence: Secondary | ICD-10-CM

## 2021-11-28 DIAGNOSIS — I2510 Atherosclerotic heart disease of native coronary artery without angina pectoris: Secondary | ICD-10-CM

## 2021-11-28 DIAGNOSIS — Z7982 Long term (current) use of aspirin: Secondary | ICD-10-CM

## 2021-11-28 DIAGNOSIS — I252 Old myocardial infarction: Secondary | ICD-10-CM | POA: Diagnosis not present

## 2021-11-28 DIAGNOSIS — I509 Heart failure, unspecified: Secondary | ICD-10-CM | POA: Diagnosis not present

## 2021-11-28 DIAGNOSIS — I6529 Occlusion and stenosis of unspecified carotid artery: Principal | ICD-10-CM | POA: Diagnosis present

## 2021-11-28 DIAGNOSIS — Z96652 Presence of left artificial knee joint: Secondary | ICD-10-CM | POA: Diagnosis present

## 2021-11-28 DIAGNOSIS — Z7984 Long term (current) use of oral hypoglycemic drugs: Secondary | ICD-10-CM

## 2021-11-28 DIAGNOSIS — I6521 Occlusion and stenosis of right carotid artery: Secondary | ICD-10-CM | POA: Diagnosis present

## 2021-11-28 DIAGNOSIS — M1990 Unspecified osteoarthritis, unspecified site: Secondary | ICD-10-CM | POA: Diagnosis present

## 2021-11-28 DIAGNOSIS — I110 Hypertensive heart disease with heart failure: Secondary | ICD-10-CM | POA: Diagnosis present

## 2021-11-28 DIAGNOSIS — E785 Hyperlipidemia, unspecified: Secondary | ICD-10-CM | POA: Diagnosis present

## 2021-11-28 DIAGNOSIS — Z79899 Other long term (current) drug therapy: Secondary | ICD-10-CM

## 2021-11-28 DIAGNOSIS — E119 Type 2 diabetes mellitus without complications: Secondary | ICD-10-CM | POA: Diagnosis present

## 2021-11-28 DIAGNOSIS — I5042 Chronic combined systolic (congestive) and diastolic (congestive) heart failure: Secondary | ICD-10-CM | POA: Diagnosis present

## 2021-11-28 DIAGNOSIS — Z95810 Presence of automatic (implantable) cardiac defibrillator: Secondary | ICD-10-CM | POA: Diagnosis not present

## 2021-11-28 DIAGNOSIS — Z881 Allergy status to other antibiotic agents status: Secondary | ICD-10-CM

## 2021-11-28 DIAGNOSIS — I440 Atrioventricular block, first degree: Secondary | ICD-10-CM | POA: Diagnosis present

## 2021-11-28 HISTORY — DX: Type 2 diabetes mellitus without complications: E11.9

## 2021-11-28 HISTORY — PX: TRANSCAROTID ARTERY REVASCULARIZATIONÂ: SHX6778

## 2021-11-28 HISTORY — PX: ULTRASOUND GUIDANCE FOR VASCULAR ACCESS: SHX6516

## 2021-11-28 LAB — CREATININE, SERUM
Creatinine, Ser: 0.95 mg/dL (ref 0.61–1.24)
GFR, Estimated: >60 mL/min (ref >60)

## 2021-11-28 LAB — GLUCOSE, CAPILLARY
Glucose-Capillary: 148 mg/dL — ABNORMAL HIGH (ref 70–99)
Glucose-Capillary: 144 mg/dL — ABNORMAL HIGH (ref 70–99)
Glucose-Capillary: 177 mg/dL — ABNORMAL HIGH (ref 70–99)
Glucose-Capillary: 300 mg/dL — ABNORMAL HIGH (ref 70–99)
Glucose-Capillary: 187 mg/dL — ABNORMAL HIGH (ref 70–99)

## 2021-11-28 LAB — CBC
HCT: 42.3 % (ref 39.0–52.0)
Hemoglobin: 14.1 g/dL (ref 13.0–17.0)
MCH: 30.7 pg (ref 26.0–34.0)
MCHC: 33.3 g/dL (ref 30.0–36.0)
MCV: 92.2 fL (ref 80.0–100.0)
Platelets: 202 10*3/uL (ref 150–400)
RBC: 4.59 MIL/uL (ref 4.22–5.81)
RDW: 13.9 % (ref 11.5–15.5)
WBC: 9.2 10*3/uL (ref 4.0–10.5)
nRBC: 0 % (ref 0.0–0.2)

## 2021-11-28 LAB — HEMOGLOBIN A1C
Hgb A1c MFr Bld: 7.5 % — ABNORMAL HIGH (ref 4.8–5.6)
Mean Plasma Glucose: 168.55 mg/dL

## 2021-11-28 LAB — POCT ACTIVATED CLOTTING TIME: Activated Clotting Time: 287 seconds

## 2021-11-28 SURGERY — TRANSCAROTID ARTERY REVASCULARIZATION (TCAR)
Anesthesia: General | Site: Neck | Laterality: Right

## 2021-11-28 MED ORDER — POLYETHYLENE GLYCOL 3350 17 G PO PACK: 17.0000 g | PACK | Freq: Every day | ORAL | Status: DC | PRN

## 2021-11-28 MED ORDER — VANCOMYCIN HCL IN DEXTROSE 1-5 GM/200ML-% IV SOLN
1000.0000 mg | Freq: Once | INTRAVENOUS | Status: AC
  Administered 2021-11-28: 1000 mg via INTRAVENOUS

## 2021-11-28 MED ORDER — ACETAMINOPHEN 325 MG PO TABS: 650.0000 mg | ORAL_TABLET | Freq: Four times a day (QID) | ORAL | Status: DC | PRN

## 2021-11-28 MED ORDER — DEXAMETHASONE SODIUM PHOSPHATE 10 MG/ML IJ SOLN
INTRAMUSCULAR | Status: DC | PRN
  Administered 2021-11-28: 10 mg via INTRAVENOUS

## 2021-11-28 MED ORDER — DROPERIDOL 2.5 MG/ML IJ SOLN: 0.6250 mg | Freq: Once | INTRAMUSCULAR | Status: DC | PRN

## 2021-11-28 MED ORDER — FENTANYL CITRATE (PF) 100 MCG/2ML IJ SOLN
INTRAMUSCULAR | Status: AC
  Filled 2021-11-28: qty 2

## 2021-11-28 MED ORDER — LIDOCAINE HCL (PF) 1 % IJ SOLN
INTRAMUSCULAR | Status: AC
  Filled 2021-11-28: qty 30

## 2021-11-28 MED ORDER — CLOPIDOGREL BISULFATE 75 MG PO TABS
75.0000 mg | ORAL_TABLET | Freq: Every day | ORAL | Status: DC
  Administered 2021-11-29: 75 mg via ORAL
  Filled 2021-11-28: qty 1

## 2021-11-28 MED ORDER — EPHEDRINE SULFATE-NACL 50-0.9 MG/10ML-% IV SOSY
PREFILLED_SYRINGE | INTRAVENOUS | Status: DC | PRN
  Administered 2021-11-28: 10 mg via INTRAVENOUS
  Administered 2021-11-28 (×2): 5 mg via INTRAVENOUS
  Administered 2021-11-28 (×2): 10 mg via INTRAVENOUS
  Administered 2021-11-28 (×2): 5 mg via INTRAVENOUS

## 2021-11-28 MED ORDER — ONDANSETRON HCL 4 MG/2ML IJ SOLN
INTRAMUSCULAR | Status: DC | PRN
  Administered 2021-11-28: 4 mg via INTRAVENOUS

## 2021-11-28 MED ORDER — AMLODIPINE BESYLATE 5 MG PO TABS
5.0000 mg | ORAL_TABLET | Freq: Every morning | ORAL | Status: DC
  Administered 2021-11-29: 5 mg via ORAL
  Filled 2021-11-28: qty 1

## 2021-11-28 MED ORDER — MORPHINE SULFATE (PF) 2 MG/ML IV SOLN: 2.0000 mg | INTRAVENOUS | Status: DC | PRN

## 2021-11-28 MED ORDER — HEPARIN SODIUM (PORCINE) 5000 UNIT/ML IJ SOLN
5000.0000 [IU] | Freq: Three times a day (TID) | INTRAMUSCULAR | Status: DC
  Administered 2021-11-28 – 2021-11-29 (×2): 5000 [IU] via SUBCUTANEOUS
  Filled 2021-11-28 (×2): qty 1

## 2021-11-28 MED ORDER — ONDANSETRON HCL 4 MG/2ML IJ SOLN: 4.0000 mg | Freq: Four times a day (QID) | INTRAMUSCULAR | Status: DC | PRN

## 2021-11-28 MED ORDER — SACUBITRIL-VALSARTAN 49-51 MG PO TABS
1.0000 | ORAL_TABLET | Freq: Two times a day (BID) | ORAL | Status: DC
  Administered 2021-11-28 – 2021-11-29 (×2): 1 via ORAL
  Filled 2021-11-28 (×2): qty 1

## 2021-11-28 MED ORDER — HYDRALAZINE HCL 20 MG/ML IJ SOLN: 5.0000 mg | INTRAMUSCULAR | Status: DC | PRN

## 2021-11-28 MED ORDER — EMPAGLIFLOZIN 10 MG PO TABS
10.0000 mg | ORAL_TABLET | Freq: Every day | ORAL | Status: DC
  Administered 2021-11-28 – 2021-11-29 (×2): 10 mg via ORAL
  Filled 2021-11-28 (×2): qty 1

## 2021-11-28 MED ORDER — METOPROLOL TARTRATE 5 MG/5ML IV SOLN: 2.0000 mg | INTRAVENOUS | Status: DC | PRN

## 2021-11-28 MED ORDER — HEPARIN SODIUM (PORCINE) 1000 UNIT/ML IJ SOLN
INTRAMUSCULAR | Status: AC
  Filled 2021-11-28: qty 10

## 2021-11-28 MED ORDER — VASOPRESSIN 20 UNIT/ML IV SOLN
INTRAVENOUS | Status: AC
  Filled 2021-11-28: qty 1

## 2021-11-28 MED ORDER — ACETAMINOPHEN 500 MG PO TABS: 1000.0000 mg | ORAL_TABLET | Freq: Once | ORAL | Status: DC

## 2021-11-28 MED ORDER — IODIXANOL 320 MG/ML IV SOLN
INTRAVENOUS | Status: DC | PRN
  Administered 2021-11-28: 10 mL via INTRA_ARTERIAL

## 2021-11-28 MED ORDER — SODIUM CHLORIDE 0.9 % IV SOLN: INTRAVENOUS | Status: DC

## 2021-11-28 MED ORDER — CARVEDILOL 12.5 MG PO TABS
12.5000 mg | ORAL_TABLET | Freq: Two times a day (BID) | ORAL | Status: DC
  Administered 2021-11-29: 12.5 mg via ORAL
  Filled 2021-11-28 (×2): qty 1

## 2021-11-28 MED ORDER — ORAL CARE MOUTH RINSE: 15.0000 mL | Freq: Once | OROMUCOSAL | Status: AC

## 2021-11-28 MED ORDER — STERILE WATER FOR IRRIGATION IR SOLN
Status: DC | PRN
  Administered 2021-11-28: 1000 mL

## 2021-11-28 MED ORDER — HEPARIN 6000 UNIT IRRIGATION SOLUTION
Status: AC
  Filled 2021-11-28: qty 500

## 2021-11-28 MED ORDER — TRAMADOL HCL 50 MG PO TABS: 50.0000 mg | ORAL_TABLET | Freq: Four times a day (QID) | ORAL | Status: DC | PRN

## 2021-11-28 MED ORDER — EPHEDRINE 5 MG/ML INJ
INTRAVENOUS | Status: AC
  Filled 2021-11-28: qty 5

## 2021-11-28 MED ORDER — CALCIUM CHLORIDE 10 % IV SOLN
INTRAVENOUS | Status: AC
  Filled 2021-11-28: qty 10

## 2021-11-28 MED ORDER — CEFAZOLIN SODIUM-DEXTROSE 2-4 GM/100ML-% IV SOLN
2.0000 g | Freq: Three times a day (TID) | INTRAVENOUS | Status: AC
  Administered 2021-11-28 (×2): 2 g via INTRAVENOUS
  Filled 2021-11-28 (×2): qty 100

## 2021-11-28 MED ORDER — ATORVASTATIN CALCIUM 80 MG PO TABS
80.0000 mg | ORAL_TABLET | Freq: Every day | ORAL | Status: DC
  Administered 2021-11-29: 80 mg via ORAL
  Filled 2021-11-28: qty 1

## 2021-11-28 MED ORDER — LABETALOL HCL 5 MG/ML IV SOLN: 10.0000 mg | INTRAVENOUS | Status: DC | PRN

## 2021-11-28 MED ORDER — FENTANYL CITRATE (PF) 250 MCG/5ML IJ SOLN
INTRAMUSCULAR | Status: DC | PRN
  Administered 2021-11-28: 100 ug via INTRAVENOUS

## 2021-11-28 MED ORDER — DEXAMETHASONE SODIUM PHOSPHATE 10 MG/ML IJ SOLN
INTRAMUSCULAR | Status: AC
  Filled 2021-11-28: qty 1

## 2021-11-28 MED ORDER — VANCOMYCIN HCL IN DEXTROSE 1-5 GM/200ML-% IV SOLN
INTRAVENOUS | Status: AC
  Filled 2021-11-28: qty 200

## 2021-11-28 MED ORDER — PROPOFOL 10 MG/ML IV BOLUS
INTRAVENOUS | Status: DC | PRN
  Administered 2021-11-28: 20 mg via INTRAVENOUS
  Administered 2021-11-28: 70 mg via INTRAVENOUS
  Administered 2021-11-28: 20 mg via INTRAVENOUS

## 2021-11-28 MED ORDER — 0.9 % SODIUM CHLORIDE (POUR BTL) OPTIME
TOPICAL | Status: DC | PRN
  Administered 2021-11-28: 1000 mL

## 2021-11-28 MED ORDER — ROCURONIUM BROMIDE 10 MG/ML (PF) SYRINGE
PREFILLED_SYRINGE | INTRAVENOUS | Status: DC | PRN
  Administered 2021-11-28: 30 mg via INTRAVENOUS
  Administered 2021-11-28: 70 mg via INTRAVENOUS

## 2021-11-28 MED ORDER — CARBOXYMETHYLCELLULOSE SODIUM 0.25 % OP SOLN: 1.0000 [drp] | Freq: Two times a day (BID) | OPHTHALMIC | Status: DC | PRN

## 2021-11-28 MED ORDER — EZETIMIBE 10 MG PO TABS
10.0000 mg | ORAL_TABLET | Freq: Every day | ORAL | Status: DC
  Administered 2021-11-29: 10 mg via ORAL
  Filled 2021-11-28: qty 1

## 2021-11-28 MED ORDER — CEFAZOLIN SODIUM-DEXTROSE 2-4 GM/100ML-% IV SOLN
2.0000 g | INTRAVENOUS | Status: DC
  Filled 2021-11-28: qty 100

## 2021-11-28 MED ORDER — INSULIN ASPART 100 UNIT/ML IJ SOLN
0.0000 [IU] | Freq: Three times a day (TID) | INTRAMUSCULAR | Status: DC
  Administered 2021-11-28: 8 [IU] via SUBCUTANEOUS
  Administered 2021-11-29: 3 [IU] via SUBCUTANEOUS

## 2021-11-28 MED ORDER — LIDOCAINE 2% (20 MG/ML) 5 ML SYRINGE
INTRAMUSCULAR | Status: DC | PRN
  Administered 2021-11-28: 60 mg via INTRAVENOUS

## 2021-11-28 MED ORDER — PROPOFOL 10 MG/ML IV BOLUS
INTRAVENOUS | Status: AC
  Filled 2021-11-28: qty 20

## 2021-11-28 MED ORDER — CHLORHEXIDINE GLUCONATE 0.12 % MT SOLN
15.0000 mL | Freq: Once | OROMUCOSAL | Status: AC
  Administered 2021-11-28: 15 mL via OROMUCOSAL
  Filled 2021-11-28: qty 15

## 2021-11-28 MED ORDER — HEMOSTATIC AGENTS (NO CHARGE) OPTIME
TOPICAL | Status: DC | PRN
  Administered 2021-11-28: 1 via TOPICAL

## 2021-11-28 MED ORDER — SODIUM CHLORIDE (PF) 0.9 % IJ SOLN
INTRAMUSCULAR | Status: AC
  Filled 2021-11-28: qty 20

## 2021-11-28 MED ORDER — SODIUM CHLORIDE 0.9 % IV SOLN: 500.0000 mL | Freq: Once | INTRAVENOUS | Status: DC | PRN

## 2021-11-28 MED ORDER — GUAIFENESIN-DM 100-10 MG/5ML PO SYRP: 15.0000 mL | ORAL_SOLUTION | ORAL | Status: DC | PRN

## 2021-11-28 MED ORDER — PANTOPRAZOLE SODIUM 40 MG PO TBEC
40.0000 mg | DELAYED_RELEASE_TABLET | Freq: Two times a day (BID) | ORAL | Status: DC
  Administered 2021-11-28 – 2021-11-29 (×2): 40 mg via ORAL
  Filled 2021-11-28 (×2): qty 1

## 2021-11-28 MED ORDER — FENTANYL CITRATE (PF) 100 MCG/2ML IJ SOLN: 25.0000 ug | INTRAMUSCULAR | Status: DC | PRN

## 2021-11-28 MED ORDER — HEPARIN SODIUM (PORCINE) 1000 UNIT/ML IJ SOLN
INTRAMUSCULAR | Status: DC | PRN
  Administered 2021-11-28: 9000 [IU] via INTRAVENOUS

## 2021-11-28 MED ORDER — OXYCODONE-ACETAMINOPHEN 5-325 MG PO TABS
1.0000 | ORAL_TABLET | ORAL | Status: DC | PRN
  Administered 2021-11-28: 2 via ORAL
  Filled 2021-11-28: qty 2

## 2021-11-28 MED ORDER — TAMSULOSIN HCL 0.4 MG PO CAPS
0.4000 mg | ORAL_CAPSULE | Freq: Every day | ORAL | Status: DC
  Administered 2021-11-29: 0.4 mg via ORAL
  Filled 2021-11-28: qty 1

## 2021-11-28 MED ORDER — HEPARIN 6000 UNIT IRRIGATION SOLUTION
Status: DC | PRN
  Administered 2021-11-28: 1

## 2021-11-28 MED ORDER — ONDANSETRON HCL 4 MG/2ML IJ SOLN
INTRAMUSCULAR | Status: AC
  Filled 2021-11-28: qty 2

## 2021-11-28 MED ORDER — MAGNESIUM SULFATE 2 GM/50ML IV SOLN: 2.0000 g | Freq: Every day | INTRAVENOUS | Status: DC | PRN

## 2021-11-28 MED ORDER — ASPIRIN 81 MG PO TBEC
81.0000 mg | DELAYED_RELEASE_TABLET | Freq: Every day | ORAL | Status: DC
  Administered 2021-11-29: 81 mg via ORAL
  Filled 2021-11-28: qty 1

## 2021-11-28 MED ORDER — METHOCARBAMOL 500 MG PO TABS: 500.0000 mg | ORAL_TABLET | Freq: Every evening | ORAL | Status: DC | PRN

## 2021-11-28 MED ORDER — GLYCOPYRROLATE 0.2 MG/ML IJ SOLN
INTRAMUSCULAR | Status: DC | PRN
  Administered 2021-11-28 (×2): .1 mg via INTRAVENOUS
  Administered 2021-11-28: .2 mg via INTRAVENOUS

## 2021-11-28 MED ORDER — LACTATED RINGERS IV SOLN: INTRAVENOUS | Status: DC

## 2021-11-28 MED ORDER — CHLORHEXIDINE GLUCONATE CLOTH 2 % EX PADS: 6.0000 | MEDICATED_PAD | Freq: Once | CUTANEOUS | Status: DC

## 2021-11-28 MED ORDER — SUGAMMADEX SODIUM 200 MG/2ML IV SOLN
INTRAVENOUS | Status: DC | PRN
  Administered 2021-11-28: 200 mg via INTRAVENOUS

## 2021-11-28 MED ORDER — PHENOL 1.4 % MT LIQD: 1.0000 | OROMUCOSAL | Status: DC | PRN

## 2021-11-28 MED ORDER — CALCIUM CHLORIDE 10 % IV SOLN
INTRAVENOUS | Status: DC | PRN
  Administered 2021-11-28 (×3): 100 mg via INTRAVENOUS

## 2021-11-28 MED ORDER — FENTANYL CITRATE (PF) 250 MCG/5ML IJ SOLN
INTRAMUSCULAR | Status: AC
  Filled 2021-11-28: qty 5

## 2021-11-28 MED ORDER — ALUM & MAG HYDROXIDE-SIMETH 200-200-20 MG/5ML PO SUSP: 15.0000 mL | ORAL | Status: DC | PRN

## 2021-11-28 MED ORDER — ROCURONIUM BROMIDE 10 MG/ML (PF) SYRINGE
PREFILLED_SYRINGE | INTRAVENOUS | Status: AC
  Filled 2021-11-28: qty 10

## 2021-11-28 MED ORDER — BISACODYL 10 MG RE SUPP: 10.0000 mg | Freq: Every day | RECTAL | Status: DC | PRN

## 2021-11-28 MED ORDER — GLYCOPYRROLATE PF 0.2 MG/ML IJ SOSY
PREFILLED_SYRINGE | INTRAMUSCULAR | Status: AC
  Filled 2021-11-28: qty 1

## 2021-11-28 MED ORDER — ACETAMINOPHEN 10 MG/ML IV SOLN
INTRAVENOUS | Status: AC
  Filled 2021-11-28: qty 100

## 2021-11-28 MED ORDER — POLYVINYL ALCOHOL 1.4 % OP SOLN: 1.0000 [drp] | OPHTHALMIC | Status: DC | PRN

## 2021-11-28 MED ORDER — DOCUSATE SODIUM 100 MG PO CAPS
100.0000 mg | ORAL_CAPSULE | Freq: Every day | ORAL | Status: DC
  Administered 2021-11-29: 100 mg via ORAL
  Filled 2021-11-28: qty 1

## 2021-11-28 MED ORDER — PHENYLEPHRINE HCL-NACL 20-0.9 MG/250ML-% IV SOLN
INTRAVENOUS | Status: DC | PRN
  Administered 2021-11-28: 50 ug/min via INTRAVENOUS

## 2021-11-28 MED ORDER — ACETAMINOPHEN 10 MG/ML IV SOLN
1000.0000 mg | Freq: Once | INTRAVENOUS | Status: AC
Start: 2021-11-28 — End: 2021-11-28
  Administered 2021-11-28: 1000 mg via INTRAVENOUS

## 2021-11-28 MED ORDER — LIDOCAINE 2% (20 MG/ML) 5 ML SYRINGE
INTRAMUSCULAR | Status: AC
  Filled 2021-11-28: qty 5

## 2021-11-28 MED ORDER — POTASSIUM CHLORIDE CRYS ER 20 MEQ PO TBCR: 20.0000 meq | EXTENDED_RELEASE_TABLET | Freq: Every day | ORAL | Status: DC | PRN

## 2021-11-28 MED ORDER — PROTAMINE SULFATE 10 MG/ML IV SOLN
INTRAVENOUS | Status: DC | PRN
  Administered 2021-11-28: 50 mg via INTRAVENOUS

## 2021-11-28 MED ORDER — PHENYLEPHRINE HCL (PRESSORS) 10 MG/ML IV SOLN
INTRAVENOUS | Status: DC | PRN
  Administered 2021-11-28 (×4): 80 ug via INTRAVENOUS

## 2021-11-28 SURGICAL SUPPLY — 47 items
BAG BANDED W/RUBBER/TAPE 36X54 (MISCELLANEOUS) ×3 IMPLANT
BAG COUNTER SPONGE SURGICOUNT (BAG) ×3 IMPLANT
BALLN STERLING RX 6X30X80 (BALLOONS) ×3
BALLOON STERLING RX 6X30X80 (BALLOONS) IMPLANT
CANISTER SUCT 3000ML PPV (MISCELLANEOUS) ×3 IMPLANT
CLIP VESOCCLUDE MED 6/CT (CLIP) ×3 IMPLANT
CLIP VESOCCLUDE SM WIDE 6/CT (CLIP) ×3 IMPLANT
COVER DOME SNAP 22 D (MISCELLANEOUS) ×3 IMPLANT
COVER PROBE W GEL 5X96 (DRAPES) ×4 IMPLANT
DERMABOND ADVANCED (GAUZE/BANDAGES/DRESSINGS) ×2
DERMABOND ADVANCED .7 DNX12 (GAUZE/BANDAGES/DRESSINGS) ×2 IMPLANT
DRAPE FEMORAL ANGIO 80X135IN (DRAPES) ×3 IMPLANT
DRSG COVADERM 4X6 (GAUZE/BANDAGES/DRESSINGS) ×1 IMPLANT
ELECT REM PT RETURN 9FT ADLT (ELECTROSURGICAL) ×3
ELECTRODE REM PT RTRN 9FT ADLT (ELECTROSURGICAL) ×2 IMPLANT
GLOVE BIO SURGEON STRL SZ7.5 (GLOVE) ×3 IMPLANT
GOWN STRL REUS W/ TWL LRG LVL3 (GOWN DISPOSABLE) ×4 IMPLANT
GOWN STRL REUS W/ TWL XL LVL3 (GOWN DISPOSABLE) ×2 IMPLANT
GOWN STRL REUS W/TWL LRG LVL3 (GOWN DISPOSABLE) ×6
GOWN STRL REUS W/TWL XL LVL3 (GOWN DISPOSABLE) ×3
GUIDEWIRE ENROUTE 0.014 (WIRE) ×3 IMPLANT
HEMOSTAT SNOW SURGICEL 2X4 (HEMOSTASIS) ×1 IMPLANT
INTRODUCER KIT GALT 7CM (INTRODUCER) ×6
KIT BASIN OR (CUSTOM PROCEDURE TRAY) ×3 IMPLANT
KIT ENCORE 26 ADVANTAGE (KITS) ×3 IMPLANT
KIT INTRODUCER GALT 7 (INTRODUCER) ×2 IMPLANT
KIT TURNOVER KIT B (KITS) ×3 IMPLANT
PACK CAROTID (CUSTOM PROCEDURE TRAY) ×3 IMPLANT
POSITIONER HEAD DONUT 9IN (MISCELLANEOUS) ×3 IMPLANT
PROTECTION STATION PRESSURIZED (MISCELLANEOUS) ×3
SET MICROPUNCTURE 5F STIFF (MISCELLANEOUS) ×3 IMPLANT
STATION PROTECTION PRESSURIZED (MISCELLANEOUS) ×2 IMPLANT
STENT TRANSCAROTID SYS 10X40 (Permanent Stent) ×1 IMPLANT
SUT MNCRL AB 4-0 PS2 18 (SUTURE) ×3 IMPLANT
SUT SILK 2 0 PERMA HAND 18 BK (SUTURE) ×3 IMPLANT
SUT VIC AB 3-0 SH 27 (SUTURE) ×3
SUT VIC AB 3-0 SH 27X BRD (SUTURE) ×2 IMPLANT
SYR 10ML LL (SYRINGE) ×9 IMPLANT
SYR 20ML LL LF (SYRINGE) ×3 IMPLANT
SYR CONTROL 10ML LL (SYRINGE) IMPLANT
SYSTEM TRANSCAROTID NEUROPRTCT (MISCELLANEOUS) ×2 IMPLANT
TOWEL GREEN STERILE (TOWEL DISPOSABLE) ×3 IMPLANT
TRANSCAROTID NEUROPROTECT SYS (MISCELLANEOUS) ×3
WATER STERILE IRR 1000ML POUR (IV SOLUTION) ×3 IMPLANT
WIRE BENTSON .035X145CM (WIRE) ×3 IMPLANT
WIRE MICRO SET SILHO 5FR 7 (SHEATH) ×1 IMPLANT
WIRE STARTER BENTSON 035X150 (WIRE) ×1 IMPLANT

## 2021-11-28 NOTE — Anesthesia Postprocedure Evaluation (Signed)
Anesthesia Post Note  Patient: Victor Estes  Procedure(s) Performed: Transcarotid Artery Revascularization (Right: Neck) ULTRASOUND GUIDANCE FOR VASCULAR ACCESS (Left: Groin)     Patient location during evaluation: PACU Anesthesia Type: General Level of consciousness: sedated and patient cooperative Pain management: pain level controlled Vital Signs Assessment: post-procedure vital signs reviewed and stable Respiratory status: spontaneous breathing Cardiovascular status: stable Anesthetic complications: no   No notable events documented.  Last Vitals:  Vitals:   11/28/21 1500 11/28/21 1600  BP: (!) 132/58 116/65  Pulse:  (!) 48  Resp:  20  Temp:  36.4 C  SpO2:  98%    Last Pain:  Vitals:   11/28/21 1600  TempSrc: Oral  PainSc:                  Nolon Nations

## 2021-11-28 NOTE — Transfer of Care (Signed)
Immediate Anesthesia Transfer of Care Note  Patient: Victor Estes  Procedure(s) Performed: Transcarotid Artery Revascularization (Right: Neck) ULTRASOUND GUIDANCE FOR VASCULAR ACCESS (Left: Groin)  Patient Location: PACU  Anesthesia Type:General  Level of Consciousness: drowsy and patient cooperative  Airway & Oxygen Therapy: Patient Spontanous Breathing  Post-op Assessment: Report given to RN and Post -op Vital signs reviewed and stable  Post vital signs: Reviewed and stable  Last Vitals:  Vitals Value Taken Time  BP 124/57 11/28/21 0927  Temp    Pulse 63 11/28/21 0928  Resp 14 11/28/21 0928  SpO2 92 % 11/28/21 0928  Vitals shown include unvalidated device data.  Last Pain:  Vitals:   11/28/21 0617  PainSc: 0-No pain      Patients Stated Pain Goal: 0 (93/81/01 7510)  Complications: No notable events documented.

## 2021-11-28 NOTE — H&P (Signed)
History and Physical Interval Note:  11/28/2021 7:31 AM  Victor Estes  has presented today for surgery, with the diagnosis of Right carotid artery stenosis.  The various methods of treatment have been discussed with the patient and family. After consideration of risks, benefits and other options for treatment, the patient has consented to  Procedure(s): Transcarotid Artery Revascularization (Right) as a surgical intervention.  The patient's history has been reviewed, patient examined, no change in status, stable for surgery.  I have reviewed the patient's chart and labs.  Questions were answered to the patient's satisfaction.     Victor Estes  Patient name: Victor Estes     MRN: [id number]        DOB: 1945/07/20            Sex: male   REASON FOR CONSULT: F/U after CTA neck to evaluate high grade right internal carotid artery stenosis    HPI: Victor Estes is a 77 y.o. male, with history of hypertension, hyperlipidemia, coronary artery disease status post four-vessel CABG in 1995 that presents for further evaluation of high-grade right internal carotid artery stenosis.  This has been followed by Dr. Gwenlyn Found for many years.  Patient states that he is asymptomatic with no recent history of strokes or TIAs.  No focal weakness or vision loss. He denies any history of neck surgery or radiation.  He works as a Development worker, community.  He denies any chest pain or shortness of breath.  His last echo showed EF in the range of 30 to 35%.   I sent him for CTA neck for evaluation of TCAR after discussion with Dr. Gwenlyn Found.  He presents today for follow-up and discussion after CTA neck.         Past Medical History:  Diagnosis Date   Cancer [facility])     Carotid artery disease ([place])     GERD (gastroesophageal reflux disease)     Hyperlipemia     Hypertension      Dr Gwenlyn Found   Myocardial infarct [facility])      CABG-1995- LIMA-D1-LAD, SVG-OM, SVG-RCA    Ventricular tachycardia, sustained ([place]) 04/27/2015            Past Surgical History:  Procedure Laterality Date   BACK SURGERY       CARDIAC CATHETERIZATION N/A 04/27/2015    Severe native dz, LIMA-LAD & SVG-OM OK, SVG-RCA chronically occluded. Left Heart Cath and Coronary Angiography;  Surgeon: Sherren Mocha, MD;  Location: [place] [facility];  Service: Cardiovascular;  Laterality: N/A;   CHOLECYSTECTOMY       COLON SURGERY       CORONARY ARTERY BYPASS GRAFT   1995     LIMA-D1-LAD, SVG-OM, SVG-RCA, Dr Redmond Pulling   ELBOW SURGERY       EP IMPLANTABLE DEVICE N/A 04/29/2015    Procedure: ICD Implant;  Surgeon: Deboraha Sprang, MD;  Location: [place] [facility];  Service: Cardiovascular;  Laterality: N/A; St Jude  ICD, serial number  [id number].   LUMBAR LAMINECTOMY/DECOMPRESSION MICRODISCECTOMY   09/09/2011    Procedure: LUMBAR LAMINECTOMY/DECOMPRESSION MICRODISCECTOMY 1 LEVEL;  Surgeon: Eustace Moore, MD;  Location: [place] [facility];  Service: Neurosurgery;  Laterality: Right;  Right Lumbar Five-Sacral One Hemilaminectomy     RIGHT/LEFT HEART CATH AND CORONARY/GRAFT ANGIOGRAPHY N/A 09/24/2020    Procedure: RIGHT/LEFT HEART CATH AND CORONARY/GRAFT ANGIOGRAPHY;  Surgeon: Troy Sine, MD;  Location: [place] [facility];  Service: Cardiovascular;  Laterality: N/A;   TOTAL KNEE  ARTHROPLASTY Left 07/26/2013    Procedure: TOTAL KNEE ARTHROPLASTY;  Surgeon: Ninetta Lights, MD;  Location: [place];  Service: Orthopedics;  Laterality: Left;           Family History  Problem Relation Age of Onset   Heart failure Mother     Hypertension Mother     Hypertension Other        SOCIAL HISTORY: Social History         Socioeconomic History   Marital status: Married      Spouse name: Veterinary surgeon   Number of children: 2   Years of education: Not on file   Highest education level: High school graduate  Occupational History   Occupation: Owns a Fish farm manager  Tobacco Use   Smoking status: Former      Types: Cigarettes      Quit date: 04/06/1983       Years since quitting: 38.6   Smokeless tobacco: Never  Vaping Use   Vaping Use: Never used  Substance and Sexual Activity   Alcohol use: No      Alcohol/week: 0.0 standard drinks   Drug use: No   Sexual activity: Not on file  Other Topics Concern   Not on file  Social History Narrative   Not on file    Social Determinants of Health    Financial Resource Strain: Not on file  Food Insecurity: Not on file  Transportation Needs: Not on file  Physical Activity: Not on file  Stress: Not on file  Social Connections: Not on file  Intimate Partner Violence: Not on file          Allergies  Allergen Reactions   Clarithromycin Swelling            Current Outpatient Medications  Medication Sig Dispense Refill   acetaminophen (TYLENOL) 325 MG tablet Take 650 mg by mouth every 6 (six) hours as needed for mild pain, moderate pain, fever or headache.       aspirin 81 MG EC tablet Take 1 tablet (81 mg total) by mouth daily with breakfast. 30 tablet 11   atorvastatin (LIPITOR) 80 MG tablet Take 1 tablet (80 mg total) by mouth at bedtime. 90 tablet 3   Carboxymethylcellulose Sodium 0.25 % SOLN Place 1 drop into both eyes in the morning and at bedtime.       carvedilol (COREG) 12.5 MG tablet Take 1 tablet (12.5 mg total) by mouth 2 (two) times daily with a meal. 180 tablet 3   Cholecalciferol 2000 UNITS TABS Take 2,000 Units by mouth daily.       empagliflozin (JARDIANCE) 10 MG TABS tablet Take 1 tablet (10 mg total) by mouth daily. 90 tablet 3   empagliflozin (JARDIANCE) 10 MG TABS tablet Take 1 tablet (10 mg total) by mouth daily before breakfast. 14 tablet 0   ENTRESTO 49-51 MG TAKE 1 TABLET BY MOUTH TWICE DAILY 60 tablet 11   metformin (FORTAMET) 500 MG (OSM) 24 hr tablet Take 1 tablet (500 mg total) by mouth daily with breakfast. 90 tablet 1   methocarbamol (ROBAXIN) 500 MG tablet Take 1 tablet (500 mg total) by mouth at bedtime. 30 tablet 11   pantoprazole (PROTONIX) 40 MG tablet Take 1  tablet (40 mg total) by mouth 2 (two) times daily. 180 tablet 3   tamsulosin (FLOMAX) 0.4 MG CAPS capsule Take 0.4 mg by mouth.       traMADol (ULTRAM) 50 MG tablet Take 1 tablet (50 mg  total) by mouth every 6 (six) hours as needed for moderate pain or severe pain. 30 tablet     zinc gluconate 50 MG tablet Take 50 mg by mouth daily.       furosemide (LASIX) 20 MG tablet Take 1 tablet (20 mg total) by mouth daily as needed (weight gain (3lbs in 1 day or 5lbs in 1 week)). 30 tablet 11   nitroGLYCERIN (NITROSTAT) 0.4 MG SL tablet Place 1 tablet (0.4 mg total) under the tongue every 5 (five) minutes as needed for chest pain. 25 tablet 2   spironolactone (ALDACTONE) 25 MG tablet TAKE 1/2 TABLET (12.5 MG TOTAL) BY MOUTH DAILY. 15 tablet 11    No current facility-administered medications for this visit.      REVIEW OF SYSTEMS:  '[X]'$  denotes positive finding, '[ ]'$  denotes negative finding Cardiac   Comments:  Chest pain or chest pressure:      Shortness of breath upon exertion:      Short of breath when lying flat:      Irregular heart rhythm:             Vascular      Pain in calf, thigh, or hip brought on by ambulation:      Pain in feet at night that wakes you up from your sleep:       Blood clot in your veins:      Leg swelling:              Pulmonary      Oxygen at home:      Productive cough:       Wheezing:              Neurologic      Sudden weakness in arms or legs:       Sudden numbness in arms or legs:       Sudden onset of difficulty speaking or slurred speech:      Temporary loss of vision in one eye:       Problems with dizziness:              Gastrointestinal      Blood in stool:       Vomited blood:              Genitourinary      Burning when urinating:       Blood in urine:             Psychiatric      Major depression:              Hematologic      Bleeding problems:      Problems with blood clotting too easily:             Skin      Rashes or ulcers:              Constitutional      Fever or chills:          PHYSICAL EXAM:     Vitals:    11/18/21 0956 11/18/21 1001  BP: (!) 149/82 (!) 151/76  Pulse: 61 61  Resp: 18    Temp: 98.2 F (36.8 C)    TempSrc: Temporal    SpO2: 93%    Weight: 185 lb (83.9 kg)    Height: '5\' 8"'$  (1.727 m)        GENERAL: The patient is a well-nourished male, in no acute distress. The vital  signs are documented above. CARDIAC: There is a regular rate and rhythm.  VASCULAR:  Palpable femoral pulses bilaterally Palpable DP pulses bilaterally PULMONARY: No respiratory distress. ABDOMEN: Soft and non-tender. MUSCULOSKELETAL: There are no major deformities or cyanosis. NEUROLOGIC: No focal weakness or paresthesias are detected.  CN II-XII grossly intact. SKIN: There are no ulcers or rashes noted. PSYCHIATRIC: The patient has a normal affect.   DATA:    CTA neck reviewed from 11/12/2021 with high-grade proximal right internal carotid artery stenosis and <50% proximal left ICA stenosis   Carotid duplex 09/15/21 with evidence of a high-grade greater than 80% right ICA stenosis with velocity 460/151 and minimal 1 to 39% left ICA stenosis.   Assessment/Plan:   76 year old male presents for evaluation of a high-grade greater than 80% right ICA stenosis that is asymptomatic and has been followed by Dr. Gwenlyn Found.  I have reviewed his CTA neck that confirms a high-grade greater than 80% proximal right ICA stenosis.  In discussion with Dr. Gwenlyn Found, he felt his best option would likely be TCAR given his cardiac history.  After review of his CTA I think he has appropriate anatomy for TCAR with flow reversal for distal embolic protection.  I discussed the TCAR procedure with the patient.  I have asked that he start taking Plavix daily and I sent a prescription to his pharmacy.  He will need to be on aspirin Plavix statin for the procedure.  We will arrange for right TCAR next Friday on 11/28/21.  Risk benefits discussed including  1% risk of perioperative stroke.   Victor Heck, MD [facility] Office: [phone]

## 2021-11-28 NOTE — Discharge Instructions (Signed)
   [facility]  Discharge Instructions   Carotid Endarterectomy (CEA)  Please refer to the following instructions for your post-procedure care. Your surgeon or physician assistant will discuss any changes with you.  Activity  You are encouraged to walk as much as you can. You can slowly return to normal activities but must avoid strenuous activity and heavy lifting until your doctor tell you it's OK. Avoid activities such as vacuuming or swinging a golf club. You can drive after one week if you are comfortable and you are no longer taking prescription pain medications. It is normal to feel tired for serval weeks after your surgery. It is also normal to have difficulty with sleep habits, eating, and bowel movements after surgery. These will go away with time.  Bathing/Showering  You may shower after you come home. Do not soak in a bathtub, hot tub, or swim until the incision heals completely.  Incision Care  Shower every day. Clean your incision with mild soap and water. Pat the area dry with a clean towel. You do not need a bandage unless otherwise instructed. Do not apply any ointments or creams to your incision. You may have skin glue on your incision. Do not peel it off. It will come off on its own in about one week. Your incision may feel thickened and raised for several weeks after your surgery. This is normal and the skin will soften over time. For Men Only: It's OK to shave around the incision but do not shave the incision itself for 2 weeks. It is common to have numbness under your chin that could last for several months.  Diet  Resume your normal diet. There are no special food restrictions following this procedure. A low fat/low cholesterol diet is recommended for all patients with vascular disease. In order to heal from your surgery, it is CRITICAL to get adequate nutrition. Your body requires vitamins, minerals, and protein. Vegetables are the best  source of vitamins and minerals. Vegetables also provide the perfect balance of protein. Processed food has little nutritional value, so try to avoid this.        Medications  Resume taking all of your medications unless your doctor or physician assistant tells you not to. If your incision is causing pain, you may take over-the- counter pain relievers such as acetaminophen (Tylenol). If you were prescribed a stronger pain medication, please be aware these medications can cause nausea and constipation. Prevent nausea by taking the medication with a snack or meal. Avoid constipation by drinking plenty of fluids and eating foods with a high amount of fiber, such as fruits, vegetables, and grains. Do not take Tylenol if you are taking prescription pain medications.  Follow Up  Our office will schedule a follow up appointment 2-3 weeks following discharge.  Please call us immediately for any of the following conditions  Increased pain, redness, drainage (pus) from your incision site. Fever of 101 degrees or higher. If you should develop stroke (slurred speech, difficulty swallowing, weakness on one side of your body, loss of vision) you should call 911 and go to the nearest emergency room.  Reduce your risk of vascular disease:  Stop smoking. If you would like help call [facility] at 1-800-QUIT-NOW (1-800-784-8669) or [place] at 336-586-4000. Manage your cholesterol Maintain a desired weight Control your diabetes Keep your blood pressure down  If you have any questions, please call the office at [phone].   

## 2021-11-28 NOTE — Op Note (Signed)
Date: November 28, 2021  Preoperative diagnosis: High-grade asymptomatic right internal carotid artery stenosis greater than 80%  Postoperative diagnosis: Same  Procedure: 1.  Ultrasound-guided access of left common femoral vein for placement of TCAR venous sheath 2.  Transcatheter placement of intravascular stent in the right cervical carotid artery including angioplasty with distal embolic protection (right TCAR)  Surgeon: Dr. [name], MD  Assistant: Roxy Horseman, PA and Luisa Dago, [place]  Indications: Patient is a 76 year old male referred by Dr. Gwenlyn Found with a high-grade asymptomatic right internal carotid artery stenosis.  Ultimately after further evaluation with a CTA neck we recommended a right TCAR with distal embolic protection.  He presents today after risks benefits discussed.  An assistant was needed for exposure and wire exchange.  Findings: Ultrasound-guided access of the left common femoral vein for placement of the TCAR venous sheath.  Then made a transverse incision above the right clavicle and dissected out the common carotid artery.  Ultimately a pursestring was placed in the artery and this was accessed with the arterial sheath.  Carotid angiogram showed a greater than 80% stenosis in the proximal right internal carotid artery.  Once we established active flow reversal with distal embolic protection, the lesion was crossed in the proximal internal carotid artery on the right and then angioplastied with a 6 mm x 30 mm angioplasty balloon and stented with a 10 mm x 40 mm Enroute stent.  Widely patent stent at completion.    Anesthesia: General  Details: Patient was taken to the operating room after informed consent was obtained.  Placed on the operative table in the supine position.  General endotracheal anesthesia was induced.  A bump was placed under his shoulder and his head was turned to the left.  I used ultrasound to mark the common carotid artery just above  the right clavicle.  The right neck and both groins were then prepped and draped in standard sterile fashion.  Timeout was performed.  Antibiotics were given.  Initially evaluated the left common femoral vein with ultrasound, it was patent, an image was saved.  This was accessed under ultrasound guidance with a micro access needle and placed a microwire and then a microsheath.  Then placed a Bentson wire and then the TCAR venous sheath in the left common femoral vein.  This aspirated and flushed easily.  Then turned our attention to the right neck where a transverse incision was made one fingerbreadth above the right clavicle.  Dissected down through the platysma with Bovie cautery and used a small wheatlander retractor to dissect between the heads of the sternocleidomastoid.  The internal jugular vein was mobilized lateral in the wound and the vagus nerve was identified and preserved.  The common carotid artery was then dissected out and controlled with a vessel loop and umbilical tape.  Pursestring was put on the anterior wall of the right common carotid artery with a 5-0 Prolene.  Patient was given 100 units/kg IV heparin.  ACT was checked to maintain greater than 250.  I then accessed the pursestring in the right common carotid artery with a micro access needle and placed a microwire and then a microsheath.  I then got a right carotid angiogram to mark the carotid bifurcation.  Once we marked the carotid bifurcation, a J-wire was placed for more support in the micro sheath and we exchanged for the arterial sheath in the right common carotid artery.  We then secured this with multiple 3-0 silk.  I  then connected the filter from the arterial sheath in the common carotid to the venous sheath in the left groin and we were on passive flow reversal.  We then got another carotid angiogram to mark the internal carotid high-grade stenosis in the proximal right internal carotid artery and also the course of the vessel  distally.  We then performed a TCAR timeout and gave glycopyrrolate.  We went on active clamp and establish that we had active flow reversal once we clamped the common carotid artery in the base of the wound.  I then crossed the internal carotid lesion with a wire antegrade and then angioplastied this with a 6 mm x 30 mm angioplasty balloon with the help of my assistant.  We then deployed a 10 mm x 40 mm Enroute stent in the distal common carotid/proximal internal carotid artery to treat the lesion with the help of my assistant.  We allowed 2 additional minutes of active flow reversal for distal embolic protection.  Final image showed widely patent stent with good flow distally.  We then removed the wire and came off active clamp.  Total flow reversal time was 8 minutes.  At that point in time I disconnected the filter and allowed the blood to drain back into the venous sheath.  We then removed the arterial sheath from the common carotid artery and tied down the pursestring with good hemostasis.  There was excellent Doppler flow here and the patient was given 50 mg protamine.  The venous sheath in the left groin was removed and manual pressure was held for 5 minutes.  Surgicel snow was used in the neck wound and then this was closed with 3-0 Vicryl in the platysma and 4-0 Monocryl in the skin and dermabond.  Awakened from anesthesia with no neurologic deficits.  Taken to recovery in stable condition.  Complication: None  Condition: Stable  [name], MD [facility] [place] Office: [place]

## 2021-11-28 NOTE — Anesthesia Procedure Notes (Signed)
Procedure Name: Intubation Date/Time: 11/28/2021 7:52 AM Performed by: Lance Coon, CRNA Pre-anesthesia Checklist: Patient identified, Emergency Drugs available, Suction available, Patient being monitored and Timeout performed Patient Re-evaluated:Patient Re-evaluated prior to induction Oxygen Delivery Method: Circle system utilized Preoxygenation: Pre-oxygenation with 100% oxygen Induction Type: IV induction Ventilation: Mask ventilation without difficulty Laryngoscope Size: Miller and 3 Grade View: Grade I Tube type: Oral Tube size: 7.5 mm Number of attempts: 1 Airway Equipment and Method: Stylet Placement Confirmation: ETT inserted through vocal cords under direct vision, positive ETCO2 and breath sounds checked- equal and bilateral Secured at: 21 cm Tube secured with: Tape Dental Injury: Teeth and Oropharynx as per pre-operative assessment

## 2021-11-29 LAB — LIPID PANEL
Cholesterol: 105 mg/dL (ref 0–200)
HDL: 37 mg/dL — ABNORMAL LOW (ref >40)
LDL Cholesterol: 54 mg/dL (ref 0–99)
Total CHOL/HDL Ratio: 2.8 RATIO
Triglycerides: 69 mg/dL (ref <150)
VLDL: 14 mg/dL (ref 0–40)

## 2021-11-29 LAB — BASIC METABOLIC PANEL
Anion gap: 7 (ref 5–15)
BUN: 18 mg/dL (ref 8–23)
CO2: 23 mmol/L (ref 22–32)
Calcium: 9.1 mg/dL (ref 8.9–10.3)
Chloride: 108 mmol/L (ref 98–111)
Creatinine, Ser: 0.93 mg/dL (ref 0.61–1.24)
GFR, Estimated: >60 mL/min (ref >60)
Glucose, Bld: 146 mg/dL — ABNORMAL HIGH (ref 70–99)
Potassium: 4.3 mmol/L (ref 3.5–5.1)
Sodium: 138 mmol/L (ref 135–145)

## 2021-11-29 LAB — CBC
HCT: 39.3 % (ref 39.0–52.0)
Hemoglobin: 13.3 g/dL (ref 13.0–17.0)
MCH: 30.5 pg (ref 26.0–34.0)
MCHC: 33.8 g/dL (ref 30.0–36.0)
MCV: 90.1 fL (ref 80.0–100.0)
Platelets: 205 10*3/uL (ref 150–400)
RBC: 4.36 MIL/uL (ref 4.22–5.81)
RDW: 13.6 % (ref 11.5–15.5)
WBC: 15.8 10*3/uL — ABNORMAL HIGH (ref 4.0–10.5)
nRBC: 0 % (ref 0.0–0.2)

## 2021-11-29 LAB — GLUCOSE, CAPILLARY: Glucose-Capillary: 180 mg/dL — ABNORMAL HIGH (ref 70–99)

## 2021-11-29 MED ORDER — OXYCODONE-ACETAMINOPHEN 5-325 MG PO TABS: 1.0000 | ORAL_TABLET | Freq: Four times a day (QID) | ORAL | 0 refills | Status: DC | PRN

## 2021-11-29 NOTE — Progress Notes (Signed)
D/c tele and IV. Went over AVS with pt and all questions were addressed.   Camren Henthorn S Jigar Zielke, RN  

## 2021-11-29 NOTE — Progress Notes (Addendum)
  Progress Note    11/29/2021 7:58 AM 1 Day Post-Op  Subjective:  no complaints. Says he is tolerating diet. Voiding without difficulty. Ambulated in hall   Vitals:   11/29/21 0200 11/29/21 0400  BP:  (!) 123/59  Pulse:  (!) 50  Resp:  16  Temp:  97.8 F (36.6 C)  SpO2: 94% 95%   Physical Exam: Cardiac:  regular Lungs:  non labored Incisions:  right neck incision is clean, dry and intact without swelling or hematoma Extremities:  moving extremities without deficits Abdomen:  soft Neurologic: alert and oriented. Speech coherent. Smile symmetric  CBC    Component Value Date/Time   WBC 15.8 (H) 11/29/2021 0500   RBC 4.36 11/29/2021 0500   HGB 13.3 11/29/2021 0500   HCT 39.3 11/29/2021 0500   PLT 205 11/29/2021 0500   MCV 90.1 11/29/2021 0500   MCH 30.5 11/29/2021 0500   MCHC 33.8 11/29/2021 0500   RDW 13.6 11/29/2021 0500   LYMPHSABS 1.2 09/22/2020 2057   MONOABS 0.8 09/22/2020 2057   EOSABS 0.0 09/22/2020 2057   BASOSABS 0.1 09/22/2020 2057    BMET    Component Value Date/Time   NA 138 11/29/2021 0500   NA 136 10/08/2020 1135   K 4.3 11/29/2021 0500   CL 108 11/29/2021 0500   CO2 23 11/29/2021 0500   GLUCOSE 146 (H) 11/29/2021 0500   BUN 18 11/29/2021 0500   BUN 20 10/08/2020 1135   CREATININE 0.93 11/29/2021 0500   CALCIUM 9.1 11/29/2021 0500   GFRNONAA >60 11/29/2021 0500   GFRAA >60 04/30/2015 0252    INR    Component Value Date/Time   INR 0.9 11/21/2021 0938     Intake/Output Summary (Last 24 hours) at 11/29/2021 0758 Last data filed at 11/29/2021 0938 Gross per 24 hour  Intake 2030 ml  Output 2320 ml  Net -290 ml     Assessment/Plan:  76 y.o. male is s/p right TCAR  1 Day Post-Op   Right neck incision is c/d/I without swelling or hematoma Left groin access site with mild ecchymosis, soft without hematoma Neurologically intact Voiding without difficulty Has ambulated without difficulty Tolerating diet Stable for discharge home  today Continue Aspirin, statin, Plavix Follow up will be in 1 month with Dr. Carlis Abbott with Carotid duplex   Karoline Caldwell, PA-C [facility] [phone] 11/29/2021 7:58 AM  I have interviewed the patient and examined the patient. I agree with the findings by the PA.  His incision looks fine.  Neuro is intact.  Left groin looks fine.  For discharge today.  Gae Gallop, MD

## 2021-12-01 ENCOUNTER — Encounter (HOSPITAL_COMMUNITY): Payer: Self-pay | Admitting: Vascular Surgery

## 2021-12-01 DIAGNOSIS — Z48812 Encounter for surgical aftercare following surgery on the circulatory system: Secondary | ICD-10-CM | POA: Diagnosis not present

## 2021-12-01 DIAGNOSIS — E119 Type 2 diabetes mellitus without complications: Secondary | ICD-10-CM | POA: Diagnosis not present

## 2021-12-01 DIAGNOSIS — I119 Hypertensive heart disease without heart failure: Secondary | ICD-10-CM | POA: Diagnosis not present

## 2021-12-01 DIAGNOSIS — E785 Hyperlipidemia, unspecified: Secondary | ICD-10-CM | POA: Diagnosis not present

## 2021-12-01 DIAGNOSIS — Z7982 Long term (current) use of aspirin: Secondary | ICD-10-CM | POA: Diagnosis not present

## 2021-12-01 DIAGNOSIS — Z7984 Long term (current) use of oral hypoglycemic drugs: Secondary | ICD-10-CM | POA: Diagnosis not present

## 2021-12-01 DIAGNOSIS — Z951 Presence of aortocoronary bypass graft: Secondary | ICD-10-CM | POA: Diagnosis not present

## 2021-12-01 DIAGNOSIS — I252 Old myocardial infarction: Secondary | ICD-10-CM | POA: Diagnosis not present

## 2021-12-01 DIAGNOSIS — Z859 Personal history of malignant neoplasm, unspecified: Secondary | ICD-10-CM | POA: Diagnosis not present

## 2021-12-01 DIAGNOSIS — Z7902 Long term (current) use of antithrombotics/antiplatelets: Secondary | ICD-10-CM | POA: Diagnosis not present

## 2021-12-01 DIAGNOSIS — K219 Gastro-esophageal reflux disease without esophagitis: Secondary | ICD-10-CM | POA: Diagnosis not present

## 2021-12-01 DIAGNOSIS — I2510 Atherosclerotic heart disease of native coronary artery without angina pectoris: Secondary | ICD-10-CM | POA: Diagnosis not present

## 2021-12-02 NOTE — Discharge Summary (Signed)
Carotid Discharge Summary     Victor Estes August 01, 1945 76 y.o. male  [id number]  Admission Date: 11/28/2021  Discharge Date: 11/29/2021  Physician: Monica Martinez, MD  Admission Diagnosis: Carotid stenosis, asymptomatic [facility]  Hospital Course:  The patient was admitted to the hospital and taken to the operating room on 11/28/2021 and underwent Right transcarotid revascularization.  The pt tolerated the procedure well and was transported to the PACU in good condition.   By POD 1, the pt neuro status remained intact. He remained hemodynamically stable. Right neck incision intact and well appearing without swelling or hematoma. Left femoral vein access site without swelling or hematoma. Very minimal incisional pain. Tolerating diet. Voiding without difficulty.   The remainder of the hospital course consisted of increasing mobilization and increasing intake of solids without difficulty.  He remained stable for discharge home POD#1. He will remain on Aspirin, statin and Plavix. PDMP was reviewed and post operative pain medication was sent to patients requested pharmacy. He has follow up arranged in 1 month with Dr. Carlis Abbott. He will have a carotid duplex evaluation at that visit.    No results for input(s): NA, K, CL, CO2, GLUCOSE, BUN, CALCIUM in the last 72 hours.  Invalid input(s): CRATININE No results for input(s): WBC, HGB, HCT, PLT in the last 72 hours. No results for input(s): INR in the last 72 hours.   Discharge Instructions     Call MD for:  difficulty breathing, headache or visual disturbances   Complete by: As directed    Call MD for:  redness, tenderness, or signs of infection (pain, swelling, redness, odor or green/yellow discharge around incision site)   Complete by: As directed    Call MD for:  severe uncontrolled pain   Complete by: As directed    Call MD for:  temperature >100.4   Complete by: As directed    Diet - low sodium heart healthy   Complete  by: As directed    Discharge wound care:   Complete by: As directed    You may wash your incision with mild soap and water, pat dry. Do not soak in bathtub   Driving Restrictions   Complete by: As directed    No driving while taking narcotic pain medication   Increase activity slowly   Complete by: As directed    Lifting restrictions   Complete by: As directed    No heavy lifting, pushing, pulling > 10 lbs for 2 weeks       Discharge Diagnosis:  Carotid stenosis, asymptomatic [I65.29]  Secondary Diagnosis: Patient Active Problem List   Diagnosis Date Noted   Carotid stenosis, asymptomatic 11/28/2021   Demand ischemia ([place]) 09/25/2020   S/P ICD (internal cardiac defibrillator) procedure 09/25/2020   Acute on chronic combined systolic and diastolic CHF (congestive heart failure) ([place]) 09/24/2020   CHF (congestive heart failure) ([place]) 09/22/2020   NSTEMI (non-ST elevated myocardial infarction) ([place])- 03/2015 2nd VT, no culprit vessel at cath 05/10/2015   Ischemic cardiomyopathy 05/10/2015   Ventricular tachyarrhythmia ([place]) 04/29/2015   Sinus bradycardia    AV block, 1st degree    Ventricular tachycardia, sustained ([place]) 04/27/2015   History of ventricular tachycardia 04/27/2015   DJD (degenerative joint disease) of knee 07/26/2013   CAD (coronary artery disease) 04/05/2013   Hypertension 04/05/2013   Hyperlipidemia LDL goal <70 04/05/2013   Carotid artery disease ([place]) 04/05/2013   Past Medical History:  Diagnosis Date   AICD (automatic cardioverter/defibrillator) present    Arthritis  Cancer ([place])    Carotid artery disease ([place])    Diabetes mellitus without complication (HCC)    GERD (gastroesophageal reflux disease)    Hyperlipemia    Hypertension    Dr Gwenlyn Found   Myocardial infarct [facility])    CABG-1995- LIMA-D1-LAD, SVG-OM, SVG-RCA    Ventricular tachycardia, sustained ([place]) 04/27/2015    Allergies as of 11/29/2021       Reactions   Clarithromycin Swelling         Medication List     STOP taking these medications    traMADol 50 MG tablet Commonly known as: ULTRAM       TAKE these medications    acetaminophen 325 MG tablet Commonly known as: TYLENOL Take 650 mg by mouth every 6 (six) hours as needed for mild pain, moderate pain, fever or headache.   ACID GONE ANTACID PO Take 1 tablet by mouth 3 (three) times daily as needed (acid reflux).   amLODipine 5 MG tablet Commonly known as: NORVASC Take 5 mg by mouth every morning.   aspirin EC 81 MG tablet Take 1 tablet (81 mg total) by mouth daily with breakfast.   atorvastatin 80 MG tablet Commonly known as: LIPITOR Take 1 tablet (80 mg total) by mouth at bedtime.   Carboxymethylcellulose Sodium 0.25 % Soln Place 1 drop into both eyes 2 (two) times daily as needed (dry eyes).   carvedilol 12.5 MG tablet Commonly known as: COREG Take 1 tablet (12.5 mg total) by mouth 2 (two) times daily with a meal.   clopidogrel 75 MG tablet Commonly known as: PLAVIX Take 1 tablet (75 mg total) by mouth daily.   empagliflozin 10 MG Tabs tablet Commonly known as: JARDIANCE Take 1 tablet (10 mg total) by mouth daily.   Entresto 49-51 MG Generic drug: sacubitril-valsartan TAKE 1 TABLET BY MOUTH TWICE DAILY What changed: how to take this   ezetimibe 10 MG tablet Commonly known as: ZETIA Take 10 mg by mouth daily.   furosemide 20 MG tablet Commonly known as: Lasix Take 1 tablet (20 mg total) by mouth daily as needed (weight gain (3lbs in 1 day or 5lbs in 1 week)).   meloxicam 7.5 MG tablet Commonly known as: MOBIC Take 7.5 mg by mouth daily as needed for pain.   metformin 500 MG (OSM) 24 hr tablet Commonly known as: FORTAMET Take 1 tablet (500 mg total) by mouth daily with breakfast.   methocarbamol 500 MG tablet Commonly known as: ROBAXIN Take 1 tablet (500 mg total) by mouth at bedtime.   nitroGLYCERIN 0.4 MG SL tablet Commonly known as: Nitrostat Place 1 tablet (0.4 mg  total) under the tongue every 5 (five) minutes as needed for chest pain.   oxyCODONE-acetaminophen 5-325 MG tablet Commonly known as: PERCOCET/ROXICET Take 1-2 tablets by mouth every 6 (six) hours as needed for moderate pain.   pantoprazole 40 MG tablet Commonly known as: PROTONIX Take 1 tablet (40 mg total) by mouth 2 (two) times daily.   spironolactone 25 MG tablet Commonly known as: ALDACTONE TAKE 1/2 TABLET (12.5 MG TOTAL) BY MOUTH DAILY.   tamsulosin 0.4 MG Caps capsule Commonly known as: FLOMAX Take 0.4 mg by mouth daily after breakfast.               Discharge Care Instructions  (From admission, onward)           Start     Ordered   11/29/21 0000  Discharge wound care:  Comments: You may wash your incision with mild soap and water, pat dry. Do not soak in bathtub   11/29/21 0804             Discharge Instructions:   [facility] Discharge Instructions Carotid Endarterectomy (CEA)  Please refer to the following instructions for your post-procedure care. Your surgeon or physician assistant will discuss any changes with you.  Activity  You are encouraged to walk as much as you can. You can slowly return to normal activities but must avoid strenuous activity and heavy lifting until your doctor tell you it's OK. Avoid activities such as vacuuming or swinging a golf club. You can drive after one week if you are comfortable and you are no longer taking prescription pain medications. It is normal to feel tired for serval weeks after your surgery. It is also normal to have difficulty with sleep habits, eating, and bowel movements after surgery. These will go away with time.  Bathing/Showering  You may shower after you come home. Do not soak in a bathtub, hot tub, or swim until the incision heals completely.  Incision Care  Shower every day. Clean your incision with mild soap and water. Pat the area dry with a clean  towel. You do not need a bandage unless otherwise instructed. Do not apply any ointments or creams to your incision. You may have skin glue on your incision. Do not peel it off. It will come off on its own in about one week. Your incision may feel thickened and raised for several weeks after your surgery. This is normal and the skin will soften over time. For Men Only: It's OK to shave around the incision but do not shave the incision itself for 2 weeks. It is common to have numbness under your chin that could last for several months.  Diet  Resume your normal diet. There are no special food restrictions following this procedure. A low fat/low cholesterol diet is recommended for all patients with vascular disease. In order to heal from your surgery, it is CRITICAL to get adequate nutrition. Your body requires vitamins, minerals, and protein. Vegetables are the best source of vitamins and minerals. Vegetables also provide the perfect balance of protein. Processed food has little nutritional value, so try to avoid this.  Medications  Resume taking all of your medications unless your doctor or physician assistant tells you not to.  If your incision is causing pain, you may take over-the- counter pain relievers such as acetaminophen (Tylenol). If you were prescribed a stronger pain medication, please be aware these medications can cause nausea and constipation.  Prevent nausea by taking the medication with a snack or meal. Avoid constipation by drinking plenty of fluids and eating foods with a high amount of fiber, such as fruits, vegetables, and grains. Do not take Tylenol if you are taking prescription pain medications.  Follow Up  Our office will schedule a follow up appointment 2-3 weeks following discharge.  Please call us immediately for any of the following conditions  Increased pain, redness, drainage (pus) from your incision site. Fever of 101 degrees or higher. If you should develop stroke  (slurred speech, difficulty swallowing, weakness on one side of your body, loss of vision) you should call 911 and go to the nearest emergency room.  Reduce your risk of vascular disease:  Stop smoking. If you would like help call [facility] at 1-800-QUIT-NOW (254)677-6919) or [place] at 240-343-2010. Manage your cholesterol Maintain  a desired weight Control your diabetes Keep your blood pressure down  If you have any questions, please call the office at [phone].  Prescriptions given: Percocet 5-325 mg q 6 hours as needed for pain #12 No Refill  Disposition: Home  Patient's condition: is Good  Follow up: 1. Dr. Carlis Abbott in 4 weeks.   Victor Galindo PA-C [facility] [phone]   --- For [facility] Registry use ---   Modified Rankin score at D/C (0-6): 0  IV medication needed for:  1. Hypertension: No 2. Hypotension: No  Post-op Complications: No  1. Post-op CVA or TIA: No  If yes: Event classification (right eye, left eye, right cortical, left cortical, verterobasilar, other): n/a  If yes: Timing of event (intra-op, <6 hrs post-op, >=6 hrs post-op, unknown): n/a  2. CN injury: No  If yes: CN not injuried   3. Myocardial infarction: No  If yes: Dx by (EKG or clinical, Troponin): n/a  4.  CHF: No  5.  Dysrhythmia (new): No  6. Wound infection: No  7. Reperfusion symptoms: No  8. Return to OR: No  If yes: return to OR for (bleeding, neurologic, other CEA incision, other): n/a  Discharge medications: Statin use:  Yes ASA use:  Yes   Beta blocker use:  Yes ACE-Inhibitor use:  No  ARB use:  No CCB use: Yes P2Y12 Antagonist use: Yes, Valu.Nieves ] Plavix, '[ ]'$  Plasugrel, '[ ]'$  Ticlopinine, '[ ]'$  Ticagrelor, '[ ]'$  Other, '[ ]'$  No for medical reason, '[ ]'$  Non-compliant, '[ ]'$  Not-indicated Anti-coagulant use:  No, '[ ]'$  Warfarin, '[ ]'$  Rivaroxaban, '[ ]'$  Dabigatran,

## 2021-12-03 DIAGNOSIS — Z7982 Long term (current) use of aspirin: Secondary | ICD-10-CM | POA: Diagnosis not present

## 2021-12-03 DIAGNOSIS — E119 Type 2 diabetes mellitus without complications: Secondary | ICD-10-CM | POA: Diagnosis not present

## 2021-12-03 DIAGNOSIS — Z7984 Long term (current) use of oral hypoglycemic drugs: Secondary | ICD-10-CM | POA: Diagnosis not present

## 2021-12-03 DIAGNOSIS — I2510 Atherosclerotic heart disease of native coronary artery without angina pectoris: Secondary | ICD-10-CM | POA: Diagnosis not present

## 2021-12-03 DIAGNOSIS — I119 Hypertensive heart disease without heart failure: Secondary | ICD-10-CM | POA: Diagnosis not present

## 2021-12-03 DIAGNOSIS — Z48812 Encounter for surgical aftercare following surgery on the circulatory system: Secondary | ICD-10-CM | POA: Diagnosis not present

## 2021-12-04 ENCOUNTER — Other Ambulatory Visit: Payer: Self-pay | Admitting: Cardiovascular Disease

## 2021-12-08 DIAGNOSIS — I119 Hypertensive heart disease without heart failure: Secondary | ICD-10-CM | POA: Diagnosis not present

## 2021-12-08 DIAGNOSIS — Z48812 Encounter for surgical aftercare following surgery on the circulatory system: Secondary | ICD-10-CM | POA: Diagnosis not present

## 2021-12-08 DIAGNOSIS — E119 Type 2 diabetes mellitus without complications: Secondary | ICD-10-CM | POA: Diagnosis not present

## 2021-12-08 DIAGNOSIS — Z7984 Long term (current) use of oral hypoglycemic drugs: Secondary | ICD-10-CM | POA: Diagnosis not present

## 2021-12-08 DIAGNOSIS — Z7982 Long term (current) use of aspirin: Secondary | ICD-10-CM | POA: Diagnosis not present

## 2021-12-08 DIAGNOSIS — I2510 Atherosclerotic heart disease of native coronary artery without angina pectoris: Secondary | ICD-10-CM | POA: Diagnosis not present

## 2021-12-15 ENCOUNTER — Telehealth: Payer: Self-pay

## 2021-12-15 NOTE — Telephone Encounter (Signed)
Joni Reining, NP at [facility] called.  Returned call, no answer, left vm

## 2021-12-15 NOTE — Telephone Encounter (Signed)
Joni Reining, NP returned my vm. She states that the pt came in for a screening colonoscopy today and had stopped his Plavix on Friday. He did not speak with anyone in this office to ensure that was appropriate given his recent TCAR on 6/2. He had colonoscopies in the past and would stop Plavix on his own. The colonoscopy was not performed. They instructed him to restart that Plavix, wait until after his f/u appt in this office before scheduling and ensure when it was appropriate to stop Plavix for the procedure, which is not urgent. She stated that if it needed to wait 6 months, that was fine, since it is a screening colonoscopy, not an acute finding diagnostic. Will forward msg to Dr Carlis Abbott as an Juluis Rainier.

## 2021-12-15 NOTE — Telephone Encounter (Signed)
Called pt, two identifiers used. Instructed pt to make sure he takes Plavix daily and watch for any s&s of stroke. Reviewed stroke symptoms with pt. Pt confirmed understanding.

## 2021-12-19 ENCOUNTER — Other Ambulatory Visit: Payer: Self-pay | Admitting: *Deleted

## 2021-12-19 DIAGNOSIS — I6521 Occlusion and stenosis of right carotid artery: Secondary | ICD-10-CM

## 2021-12-22 ENCOUNTER — Ambulatory Visit (INDEPENDENT_AMBULATORY_CARE_PROVIDER_SITE_OTHER): Payer: Medicare Other

## 2021-12-22 DIAGNOSIS — I472 Ventricular tachycardia, unspecified: Secondary | ICD-10-CM

## 2021-12-24 LAB — CUP PACEART REMOTE DEVICE CHECK
Battery Remaining Longevity: 49 mo
Battery Remaining Percentage: 50 %
Battery Voltage: 2.92 V
Brady Statistic RV Percent Paced: 9.4 %
Date Time Interrogation Session: 20230626020014
HighPow Impedance: 57 Ohm
HighPow Impedance: 57 Ohm
Implantable Lead Implant Date: 20161031
Implantable Lead Location: 753860
Implantable Lead Model: 181
Implantable Lead Serial Number: 333140
Implantable Pulse Generator Implant Date: 20161031
Lead Channel Impedance Value: 310 Ohm
Lead Channel Pacing Threshold Amplitude: 1 V
Lead Channel Pacing Threshold Pulse Width: 0.5 ms
Lead Channel Sensing Intrinsic Amplitude: 5.3 mV
Lead Channel Setting Pacing Amplitude: 2.5 V
Lead Channel Setting Pacing Pulse Width: 0.5 ms
Lead Channel Setting Sensing Sensitivity: 0.5 mV
Pulse Gen Serial Number: 7308468

## 2022-01-06 ENCOUNTER — Encounter: Payer: Self-pay | Admitting: Vascular Surgery

## 2022-01-06 ENCOUNTER — Ambulatory Visit (INDEPENDENT_AMBULATORY_CARE_PROVIDER_SITE_OTHER): Payer: Medicare Other | Admitting: Cardiovascular Disease

## 2022-01-06 ENCOUNTER — Ambulatory Visit (INDEPENDENT_AMBULATORY_CARE_PROVIDER_SITE_OTHER): Payer: Medicare Other | Admitting: Vascular Surgery

## 2022-01-06 ENCOUNTER — Encounter: Payer: Self-pay | Admitting: Cardiovascular Disease

## 2022-01-06 ENCOUNTER — Ambulatory Visit (HOSPITAL_COMMUNITY)
Admission: RE | Admit: 2022-01-06 | Discharge: 2022-01-06 | Disposition: A | Discharge status: Home / Self Care | Payer: Medicare Other | Source: Ambulatory Visit | Attending: Vascular Surgery | Admitting: Vascular Surgery

## 2022-01-06 ENCOUNTER — Other Ambulatory Visit: Payer: Self-pay

## 2022-01-06 VITALS — BP 141/69 | HR 51 | Temp 98.1°F | Resp 16 | Ht 68.0 in | Wt 187.0 lb

## 2022-01-06 DIAGNOSIS — E785 Hyperlipidemia, unspecified: Secondary | ICD-10-CM

## 2022-01-06 DIAGNOSIS — I255 Ischemic cardiomyopathy: Secondary | ICD-10-CM | POA: Diagnosis not present

## 2022-01-06 DIAGNOSIS — I6521 Occlusion and stenosis of right carotid artery: Secondary | ICD-10-CM

## 2022-01-06 DIAGNOSIS — I472 Ventricular tachycardia, unspecified: Secondary | ICD-10-CM | POA: Diagnosis not present

## 2022-01-06 DIAGNOSIS — I10 Essential (primary) hypertension: Secondary | ICD-10-CM | POA: Diagnosis not present

## 2022-01-06 DIAGNOSIS — I2510 Atherosclerotic heart disease of native coronary artery without angina pectoris: Secondary | ICD-10-CM

## 2022-01-06 NOTE — Assessment & Plan Note (Signed)
History of carotid artery disease with high-grade right ICA stenosis status post TCAR performed by Dr. Fortunato Curling 11/28/2021 with an excellent clinical result.  Dr. Carlis Abbott will be following his Dopplers.

## 2022-01-06 NOTE — Progress Notes (Signed)
01/06/2022 Griffith Citron Bozzi   07-Sep-1945  [id number]  [facility]ologist: Lorretta Harp MD Garret Reddish, [place], [place]  HPI:  Victor Estes is a 76 y.o.   mildly overweight, married Caucasian male, father of 76, grandfather to 5 grandchildren who I have been taking care of for the last 30 years. I last saw him in the office 10/22/2021.Marland KitchenHe is accompanied by his wife Suanne Marker today.  He had an MI back in 1993 and subsequent coronary artery bypass grafting x4 in 1995. His risk factors are remarkable for remote tobacco abuse, hypertension, hyperlipidemia, and family history. His last Myoview performed 2 year ago showed inferior scar without ischemia, unchanged from prior studies. A 2D echo revealed an EF of 45% to 50% with inferior and septal hypokinesia. Carotid Dopplers showed moderate right ICA stenosis unchanged from prior studies. He is neurologically asymptomatic. [facility] [place] [facility]31[facility]13 revealed a total cholesterol of 141, LDL 61 HDL 63. He denies chest pain or shortness of breath. He had  elective TKR replacement by Dr. Percell Miller 07/05/13. I obtained a Myoview stress test 03/29/13 that showed impaired scar without ischemia. The epicardium slightly to 40% by quantitative gated SPECT. A 2-D echocardiogram performed 05/24/13 revealed an ejection fraction of 40-45%.. Since I saw him a year ago he's remained clinically stable. He gets a rare episode of dizziness on the golf [place]. 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 He was admitted to [facility] on 04/27/15 with sustained ventricular tachycardia requiring cardioversion. He had a cardiac catheterization by Dr. Burt Knack revealing stable anatomy without a "culprit lesion and therefore his VT was thought to be "scar related". Based on this, Dr. Caryl Comes placed a ICD for secondary prevention as follow this up as an outpatient since.   He did  develop COVID-19 back in January and has been slowly recovering since that time.  He had a persistent cough and was treated by Dr. Jomarie Longs.  Because of dyspnea on exertion I performed 2D echocardiography on 09/13/2020 revealing ejection fraction of 40 to 45% without significant valvular abnormalities.  A Myoview stress test performed at the same time showed inferior scar without ischemia unchanged from prior studies.  After I saw him on 09/17/2020 he was admitted to the hospital 5 days later with volume overload systolic heart failure and was diuresed.  His EF at that time had declined to 20 to 25% from 40 to 45% a week or 2 prior to that for unclear reasons.  He underwent right left heart cath by Dr. Claiborne Billings who found unchanged anatomy.  He has been seen as an outpatient by our Pharm.D.'s who have been titrating his heart failure medications and currently he is on guideline directed optimal medical therapy and is clinically improved.  Since I saw him 3 months ago he continues to do well.  He denies chest pain or shortness of breath.  I did refer him to Dr. Fortunato Curling because of asymptomatic high-grade right ICA stenosis.  He underwent successful and uncomplicated TCAR on 07/31/9796 was discharged home the following day.  He was playing golf 2 weeks later.   Current Meds  Medication Sig   acetaminophen (TYLENOL) 325 MG tablet Take 650 mg by mouth every 6 (six) hours as needed for mild pain, moderate pain, fever or headache.   Alum Hydroxide-Mag Carbonate (ACID GONE ANTACID PO) Take 1 tablet by mouth 3 (three)  times daily as needed (acid reflux).   amLODipine (NORVASC) 5 MG tablet Take 5 mg by mouth every morning.   aspirin 81 MG EC tablet Take 1 tablet (81 mg total) by mouth daily with breakfast.   atorvastatin (LIPITOR) 80 MG tablet Take 1 tablet (80 mg total) by mouth at bedtime.   Carboxymethylcellulose Sodium 0.25 % SOLN Place 1 drop into both eyes 2 (two) times daily as needed (dry eyes).   carvedilol  (COREG) 12.5 MG tablet Take 1 tablet (12.5 mg total) by mouth 2 (two) times daily with a meal.   clopidogrel (PLAVIX) 75 MG tablet Take 1 tablet (75 mg total) by mouth daily.   empagliflozin (JARDIANCE) 25 MG TABS tablet Take 25 mg by mouth daily. Take on half tablet my mouth every morning   ENTRESTO 49-51 MG TAKE 1 TABLET BY MOUTH TWICE DAILY (Patient taking differently: 1 tablet 2 (two) times daily.)   ezetimibe (ZETIA) 10 MG tablet Take 10 mg by mouth daily.   furosemide (LASIX) 20 MG tablet Take 1 tablet (20 mg total) by mouth daily as needed (weight gain (3lbs in 1 day or 5lbs in 1 week)).   meloxicam (MOBIC) 7.5 MG tablet Take 7.5 mg by mouth daily as needed for pain.   metformin (FORTAMET) 500 MG (OSM) 24 hr tablet Take 1 tablet (500 mg total) by mouth daily with breakfast.   methocarbamol (ROBAXIN) 500 MG tablet Take 1 tablet (500 mg total) by mouth at bedtime.   nitroGLYCERIN (NITROSTAT) 0.4 MG SL tablet Place 1 tablet (0.4 mg total) under the tongue every 5 (five) minutes as needed for chest pain.   oxyCODONE-acetaminophen (PERCOCET/ROXICET) 5-325 MG tablet Take 1-2 tablets by mouth every 6 (six) hours as needed for moderate pain.   pantoprazole (PROTONIX) 40 MG tablet Take 1 tablet (40 mg total) by mouth 2 (two) times daily.   spironolactone (ALDACTONE) 25 MG tablet TAKE 1/2 TABLET (12.5 MG TOTAL) BY MOUTH DAILY.   tamsulosin (FLOMAX) 0.4 MG CAPS capsule Take 0.4 mg by mouth daily after breakfast.     Allergies  Allergen Reactions   Clarithromycin Swelling    Social History   Socioeconomic History   Marital status: Married    Spouse name: Veterinary surgeon   Number of children: 2   Years of education: Not on file   Highest education level: High school graduate  Occupational History   Occupation: Owns a Fish farm manager  Tobacco Use   Smoking status: Former    Types: Cigarettes    Quit date: 04/06/1983    Years since quitting: 38.7   Smokeless tobacco: Never  Vaping Use    Vaping Use: Never used  Substance and Sexual Activity   Alcohol use: No    Alcohol/week: 0.0 standard drinks of alcohol   Drug use: No   Sexual activity: Not on file  Other Topics Concern   Not on file  Social History Narrative   Not on file   Social Determinants of Health   Financial Resource Strain: Low Risk  (09/25/2020)   Overall Financial Resource Strain (CARDIA)    Difficulty of Paying Living Expenses: Not hard at all  Food Insecurity: No Food Insecurity (09/25/2020)   Hunger Vital Sign    Worried About Running Out of Food in the Last Year: Never true    [place] in the Last Year: Never true  Transportation Needs: No Transportation Needs (09/25/2020)   PRAPARE - Transportation    Lack of Transportation (  Medical): No    Lack of Transportation (Non-Medical): No  Physical Activity: Insufficiently Active (09/25/2020)   Exercise Vital Sign    Days of Exercise per Week: 3 days    Minutes of Exercise per Session: 30 min  Stress: Not on file  Social Connections: Not on file  Intimate Partner Violence: Not on file     Review of Systems: General: negative for chills, fever, night sweats or weight changes.  Cardiovascular: negative for chest pain, dyspnea on exertion, edema, orthopnea, palpitations, paroxysmal nocturnal dyspnea or shortness of breath Dermatological: negative for rash Respiratory: negative for cough or wheezing Urologic: negative for hematuria Abdominal: negative for nausea, vomiting, diarrhea, bright red blood per rectum, melena, or hematemesis Neurologic: negative for visual changes, syncope, or dizziness All other systems reviewed and are otherwise negative except as noted above.    Blood pressure (!) 144/70, pulse (!) 50, height '5\' 8"'$  (1.727 m), weight 188 lb (85.3 kg), SpO2 96 %.  General appearance: alert and no distress Neck: no adenopathy, no carotid bruit, no JVD, supple, symmetrical, trachea midline, and thyroid not enlarged, symmetric, no  tenderness/mass/nodules Lungs: clear to auscultation bilaterally Heart: regular rate and rhythm, S1, S2 normal, no murmur, click, rub or gallop Extremities: extremities normal, atraumatic, no cyanosis or edema Pulses: 2+ and symmetric Skin: Skin color, texture, turgor normal. No rashes or lesions Neurologic: Grossly normal  EKG not performed today  ASSESSMENT AND PLAN:   CAD (coronary artery disease) History of CAD status post myocardial infarction back in 1993 and subsequent bypass grafting x4 in 1995.  He had cardiac catheterization by Dr. Burt Knack in 2016 because of ventricular tachycardia without a culprit lesion.  He underwent right left heart cath by Dr. Claiborne Billings March 2022 with unchanged anatomy.  He denies chest pain or shortness of breath.  Hypertension History of essential hypertension blood pressure measured today at 144/70.  He is on amlodipine, carvedilol and Entresto.  Hyperlipidemia LDL goal <70 History of hyperlipidemia on Zetia and high-dose atorvastatin with lipid profile performed 11/29/2021 revealing total cholesterol 105, LDL 54 and HDL 37.  Carotid artery disease (HCC) History of carotid artery disease with high-grade right ICA stenosis status post TCAR performed by Dr. Fortunato Curling 11/28/2021 with an excellent clinical result.  Dr. Carlis Abbott will be following his Dopplers.   Ventricular tachycardia, sustained ([place]) History of ventricular tachycardia status post ICD implantation followed by Dr. Caryl Comes.  Ischemic cardiomyopathy History of ischemic cardiomyopathy with an EF in the 30 to 35% range by 2D echo performed 5//23.  He is on guideline directed optimal medical therapy.  His echo also revealed mild to moderate mitral regurgitation.  This will be followed on an annual basis.     Lorretta Harp MD FACP,FACC,FAHA, [facility] 01/06/2022 8:36 AM

## 2022-01-06 NOTE — Assessment & Plan Note (Signed)
History of ventricular tachycardia status post ICD implantation followed by Dr. Caryl Comes.

## 2022-01-06 NOTE — Assessment & Plan Note (Signed)
History of hyperlipidemia on Zetia and high-dose atorvastatin with lipid profile performed 11/29/2021 revealing total cholesterol 105, LDL 54 and HDL 37.

## 2022-01-06 NOTE — Assessment & Plan Note (Signed)
History of essential hypertension blood pressure measured today at 144/70.  He is on amlodipine, carvedilol and Entresto.

## 2022-01-06 NOTE — Progress Notes (Signed)
Patient name: Victor Estes MRN: [id number] DOB: [birth year] Sex: male  REASON FOR CONSULT: Postop check after right TCAR  HPI: Victor Estes is a 76 y.o. male, with history of hypertension, hyperlipidemia, coronary artery disease status post four-vessel CABG in 1995 that presents for postop check after right TCAR.  Patient has been followed by Dr. Gwenlyn Found for many years.  TCAR was on 11/28/2021 for high-grade asymptomatic stenosis.  Patient did well after surgery with no issues.  Today reports his incision is healing.  He has no new concerns.  No neurologic issues since discharge. He works as a Development worker, community.  Taking his aspirin, plavix, and statin.  Past Medical History:  Diagnosis Date   AICD (automatic cardioverter/defibrillator) present    Arthritis    Cancer ([place])    Carotid artery disease ([place])    Diabetes mellitus without complication ([place])    GERD (gastroesophageal reflux disease)    Hyperlipemia    Hypertension    Dr Gwenlyn Found   Myocardial infarct [facility])    CABG-1995- LIMA-D1-LAD, SVG-OM, SVG-RCA    Ventricular tachycardia, sustained ([place]) 04/27/2015    Past Surgical History:  Procedure Laterality Date   BACK SURGERY     CARDIAC CATHETERIZATION N/A 04/27/2015   Severe native dz, LIMA-LAD & SVG-OM OK, SVG-RCA chronically occluded. Left Heart Cath and Coronary Angiography;  Surgeon: Sherren Mocha, MD;  Location: [facility];  Service: Cardiovascular;  Laterality: N/A;   CHOLECYSTECTOMY     COLON SURGERY     CORONARY ARTERY BYPASS GRAFT  1995    LIMA-D1-LAD, SVG-OM, SVG-RCA, Dr Redmond Pulling   ELBOW SURGERY     EP IMPLANTABLE DEVICE N/A 04/29/2015   Procedure: ICD Implant;  Surgeon: Deboraha Sprang, MD;  Location: [facility];  Service: Cardiovascular;  Laterality: N/A; St Jude  ICD, serial number  [id number].   LUMBAR LAMINECTOMY/DECOMPRESSION MICRODISCECTOMY  09/09/2011   Procedure: LUMBAR LAMINECTOMY/DECOMPRESSION MICRODISCECTOMY 1 LEVEL;  Surgeon: Eustace Moore, MD;   Location: [facility];  Service: Neurosurgery;  Laterality: Right;  Right Lumbar Five-Sacral One Hemilaminectomy    RIGHT/LEFT HEART CATH AND CORONARY/GRAFT ANGIOGRAPHY N/A 09/24/2020   Procedure: RIGHT/LEFT HEART CATH AND CORONARY/GRAFT ANGIOGRAPHY;  Surgeon: Troy Sine, MD;  Location: [facility];  Service: Cardiovascular;  Laterality: N/A;   TOTAL KNEE ARTHROPLASTY Left 07/26/2013   Procedure: TOTAL KNEE ARTHROPLASTY;  Surgeon: Ninetta Lights, MD;  Location: [facility];  Service: Orthopedics;  Laterality: Left;   TRANSCAROTID ARTERY REVASCULARIZATION  Right 11/28/2021   Procedure: Transcarotid Artery Revascularization;  Surgeon: [name], MD;  Location: [facility];  Service: Vascular;  Laterality: Right;   ULTRASOUND GUIDANCE FOR VASCULAR ACCESS Left 11/28/2021   Procedure: ULTRASOUND GUIDANCE FOR VASCULAR ACCESS;  Surgeon: [name], MD;  [facility];  Service: Vascular;  Laterality: Left;    Family History  Problem Relation Age of Onset   Heart failure Mother    Hypertension Mother    Hypertension Other     SOCIAL HISTORY: Social History   Socioeconomic History   Marital status: Married    Spouse name: Veterinary surgeon   Number of children: 2   Years of education: Not on file   Highest education level: High school graduate  Occupational History   Occupation: Owns a Fish farm manager  Tobacco Use   Smoking status: Former    Types: Cigarettes    Quit date: 04/06/1983    Years since quitting: 38.7   Smokeless tobacco:  Never  Vaping Use   Vaping Use: Never used  Substance and Sexual Activity   Alcohol use: No    Alcohol/week: 0.0 standard drinks of alcohol   Drug use: No   Sexual activity: Not on file  Other Topics Concern   Not on file  Social History Narrative   Not on file   Social Determinants of Health   Financial Resource Strain: Low Risk  (09/25/2020)   Overall Financial Resource Strain (CARDIA)    Difficulty of Paying Living  Expenses: Not hard at all  Food Insecurity: No Food Insecurity (09/25/2020)   Hunger Vital Sign    Worried About Running Out of Food in the Last Year: Never true    Ran Out of Food in the Last Year: Never true  Transportation Needs: No Transportation Needs (09/25/2020)   PRAPARE - Hydrologist (Medical): No    Lack of Transportation (Non-Medical): No  Physical Activity: Insufficiently Active (09/25/2020)   Exercise Vital Sign    Days of Exercise per Week: 3 days    Minutes of Exercise per Session: 30 min  Stress: Not on file  Social Connections: Not on file  Intimate Partner Violence: Not on file    Allergies  Allergen Reactions   Clarithromycin Swelling    Current Outpatient Medications  Medication Sig Dispense Refill   acetaminophen (TYLENOL) 325 MG tablet Take 650 mg by mouth every 6 (six) hours as needed for mild pain, moderate pain, fever or headache.     Alum Hydroxide-Mag Carbonate (ACID GONE ANTACID PO) Take 1 tablet by mouth 3 (three) times daily as needed (acid reflux).     amLODipine (NORVASC) 5 MG tablet Take 5 mg by mouth every morning.     aspirin 81 MG EC tablet Take 1 tablet (81 mg total) by mouth daily with breakfast. 30 tablet 11   atorvastatin (LIPITOR) 80 MG tablet Take 1 tablet (80 mg total) by mouth at bedtime. 90 tablet 3   Carboxymethylcellulose Sodium 0.25 % SOLN Place 1 drop into both eyes 2 (two) times daily as needed (dry eyes).     carvedilol (COREG) 12.5 MG tablet Take 1 tablet (12.5 mg total) by mouth 2 (two) times daily with a meal. 180 tablet 3   clopidogrel (PLAVIX) 75 MG tablet Take 1 tablet (75 mg total) by mouth daily. 30 tablet 6   empagliflozin (JARDIANCE) 25 MG TABS tablet Take 25 mg by mouth daily. Take on half tablet my mouth every morning     ENTRESTO 49-51 MG TAKE 1 TABLET BY MOUTH TWICE DAILY (Patient taking differently: 1 tablet 2 (two) times daily.) 60 tablet 11   ezetimibe (ZETIA) 10 MG tablet Take 10 mg by  mouth daily.     furosemide (LASIX) 20 MG tablet Take 1 tablet (20 mg total) by mouth daily as needed (weight gain (3lbs in 1 day or 5lbs in 1 week)). 30 tablet 11   meloxicam (MOBIC) 7.5 MG tablet Take 7.5 mg by mouth daily as needed for pain.     metformin (FORTAMET) 500 MG (OSM) 24 hr tablet Take 1 tablet (500 mg total) by mouth daily with breakfast. 90 tablet 1   methocarbamol (ROBAXIN) 500 MG tablet Take 1 tablet (500 mg total) by mouth at bedtime. 30 tablet 11   nitroGLYCERIN (NITROSTAT) 0.4 MG SL tablet Place 1 tablet (0.4 mg total) under the tongue every 5 (five) minutes as needed for chest pain. 25 tablet 2   oxyCODONE-acetaminophen (  PERCOCET/ROXICET) 5-325 MG tablet Take 1-2 tablets by mouth every 6 (six) hours as needed for moderate pain. 12 tablet 0   pantoprazole (PROTONIX) 40 MG tablet Take 1 tablet (40 mg total) by mouth 2 (two) times daily. 180 tablet 3   spironolactone (ALDACTONE) 25 MG tablet TAKE 1/2 TABLET (12.5 MG TOTAL) BY MOUTH DAILY. 15 tablet 11   tamsulosin (FLOMAX) 0.4 MG CAPS capsule Take 0.4 mg by mouth daily after breakfast.     No current facility-administered medications for this visit.    REVIEW OF SYSTEMS:  '[X]'$  denotes positive finding, '[ ]'$  denotes negative finding Cardiac  Comments:  Chest pain or chest pressure:    Shortness of breath upon exertion:    Short of breath when lying flat:    Irregular heart rhythm:        Vascular    Pain in calf, thigh, or hip brought on by ambulation:    Pain in feet at night that wakes you up from your sleep:     Blood clot in your veins:    Leg swelling:         Pulmonary    Oxygen at home:    Productive cough:     Wheezing:         Neurologic    Sudden weakness in arms or legs:     Sudden numbness in arms or legs:     Sudden onset of difficulty speaking or slurred speech:    Temporary loss of vision in one eye:     Problems with dizziness:         Gastrointestinal    Blood in stool:     Vomited blood:          Genitourinary    Burning when urinating:     Blood in urine:        Psychiatric    Major depression:         Hematologic    Bleeding problems:    Problems with blood clotting too easily:        Skin    Rashes or ulcers:        Constitutional    Fever or chills:      PHYSICAL EXAM: Vitals:   01/06/22 1221 01/06/22 1226  BP: (!) 158/93 (!) 141/69  Pulse: (!) 51 (!) 51  Resp: 16   Temp: 98.1 F (36.7 C)   TempSrc: Temporal   SpO2: 94%   Weight: 187 lb (84.8 kg)   Height: '5\' 8"'$  (1.727 m)     GENERAL: The patient is a well-nourished male, in no acute distress. The vital signs are documented above. CARDIAC: There is a regular rate and rhythm.  VASCULAR:  Right neck incision healing without issue NEUROLOGIC: No focal weakness or paresthesias are detected.  CN II-XII grossly intact. SKIN: There are no ulcers or rashes noted. PSYCHIATRIC: The patient has a normal affect.  DATA:   Carotid duplex today shows widely patent right ICA stent with minimal 1 to 39% stenosis in the left ICA  Assessment/Plan:  75 year old male presents for postop check after right TCAR on 11/28/2021 for high-grade asymptomatic stenosis.  He has done well since surgery and is recovering without issues.  Carotid duplex today shows his right carotid stent is widely patent and very pleased with his progress.  Discussed I will see him in 9 months for further surveillance and if the stent looks good at next follow-up will let Dr. Gwenlyn Found follow in the  future.  [name], MD [facility] Office: [phone]

## 2022-01-06 NOTE — Patient Instructions (Signed)

## 2022-01-06 NOTE — Assessment & Plan Note (Signed)
History of ischemic cardiomyopathy with an EF in the 30 to 35% range by 2D echo performed 5//23.  He is on guideline directed optimal medical therapy.  His echo also revealed mild to moderate mitral regurgitation.  This will be followed on an annual basis.

## 2022-01-06 NOTE — Assessment & Plan Note (Signed)
History of CAD status post myocardial infarction back in 1993 and subsequent bypass grafting x4 in 1995.  He had cardiac catheterization by Dr. Burt Knack in 2016 because of ventricular tachycardia without a culprit lesion.  He underwent right left heart cath by Dr. Claiborne Billings March 2022 with unchanged anatomy.  He denies chest pain or shortness of breath.

## 2022-01-07 ENCOUNTER — Encounter (HOSPITAL_COMMUNITY): Payer: Medicare Other

## 2022-01-15 NOTE — Progress Notes (Signed)
Remote ICD transmission.   

## 2022-02-23 DIAGNOSIS — L03818 Cellulitis of other sites: Secondary | ICD-10-CM | POA: Diagnosis not present

## 2022-03-03 ENCOUNTER — Telehealth: Payer: Self-pay

## 2022-03-03 NOTE — Telephone Encounter (Signed)
Victor Estes, case manager, with Victor Estes called requesting a surgical clearance letter to have a Pterygium removed from his L eye on 04/02/22.   Typed clearance letter, Dr Carlis Abbott signed, faxed to Victor Estes, fax successful.

## 2022-03-09 NOTE — Progress Notes (Signed)
Fax received for medical clearance/medication hold for Dr. Carlis Abbott.  Provider signed, form faxed back to sender, verified successful.

## 2022-03-23 ENCOUNTER — Ambulatory Visit (INDEPENDENT_AMBULATORY_CARE_PROVIDER_SITE_OTHER): Payer: Medicare Other

## 2022-03-23 DIAGNOSIS — I472 Ventricular tachycardia, unspecified: Secondary | ICD-10-CM | POA: Diagnosis not present

## 2022-03-23 DIAGNOSIS — M1711 Unilateral primary osteoarthritis, right knee: Secondary | ICD-10-CM | POA: Diagnosis not present

## 2022-03-24 LAB — CUP PACEART REMOTE DEVICE CHECK
Battery Remaining Longevity: 48 mo
Battery Remaining Percentage: 48 %
Battery Voltage: 2.92 V
Brady Statistic RV Percent Paced: 5.8 %
Date Time Interrogation Session: 20230925020017
HighPow Impedance: 60 Ohm
HighPow Impedance: 60 Ohm
Implantable Lead Implant Date: 20161031
Implantable Lead Location: 753860
Implantable Lead Model: 181
Implantable Lead Serial Number: 333140
Implantable Pulse Generator Implant Date: 20161031
Lead Channel Impedance Value: 330 Ohm
Lead Channel Pacing Threshold Amplitude: 1 V
Lead Channel Pacing Threshold Pulse Width: 0.5 ms
Lead Channel Sensing Intrinsic Amplitude: 8.1 mV
Lead Channel Setting Pacing Amplitude: 2.5 V
Lead Channel Setting Pacing Pulse Width: 0.5 ms
Lead Channel Setting Sensing Sensitivity: 0.5 mV
Pulse Gen Serial Number: 7308468

## 2022-03-31 ENCOUNTER — Telehealth: Payer: Self-pay | Admitting: Cardiovascular Disease

## 2022-03-31 NOTE — Telephone Encounter (Signed)
Called patient left message on voice mail to call back. 

## 2022-03-31 NOTE — Telephone Encounter (Signed)
Patient returned RN's call. 

## 2022-03-31 NOTE — Telephone Encounter (Signed)
Spoke to patient he stated the [place] requires a letter saying Dr.Berry wants him to stay on Entresto and Jardiance.Advised I will have Dr.Berry sign letter today.I will place in mail to your home today.

## 2022-03-31 NOTE — Telephone Encounter (Signed)
Patient called stating he is trying to get his medication through the [place], but his needs something stating why Dr. Gwenlyn Found put him on those medications.  He said it's empagliflozin (JARDIANCE) 25 MG TABS tablet and ENTRESTO 49-51 MG.

## 2022-04-09 NOTE — Progress Notes (Signed)
Remote ICD transmission.   

## 2022-06-23 ENCOUNTER — Ambulatory Visit (INDEPENDENT_AMBULATORY_CARE_PROVIDER_SITE_OTHER): Payer: Medicare Other

## 2022-06-23 DIAGNOSIS — I472 Ventricular tachycardia, unspecified: Secondary | ICD-10-CM

## 2022-06-24 LAB — CUP PACEART REMOTE DEVICE CHECK
Battery Remaining Longevity: 46 mo
Battery Remaining Percentage: 46 %
Battery Voltage: 2.9 V
Brady Statistic RV Percent Paced: 5.4 %
Date Time Interrogation Session: 20231226035209
HighPow Impedance: 73 Ohm
HighPow Impedance: 73 Ohm
Implantable Lead Connection Status: 753985
Implantable Lead Implant Date: 20161031
Implantable Lead Location: 753860
Implantable Lead Model: 181
Implantable Lead Serial Number: 333140
Implantable Pulse Generator Implant Date: 20161031
Lead Channel Impedance Value: 300 Ohm
Lead Channel Pacing Threshold Amplitude: 1 V
Lead Channel Pacing Threshold Pulse Width: 0.5 ms
Lead Channel Sensing Intrinsic Amplitude: 7.6 mV
Lead Channel Setting Pacing Amplitude: 2.5 V
Lead Channel Setting Pacing Pulse Width: 0.5 ms
Lead Channel Setting Sensing Sensitivity: 0.5 mV
Pulse Gen Serial Number: 7308468

## 2022-07-20 NOTE — Progress Notes (Signed)
Remote ICD transmission.   

## 2022-09-21 ENCOUNTER — Ambulatory Visit (INDEPENDENT_AMBULATORY_CARE_PROVIDER_SITE_OTHER): Payer: Medicare Other

## 2022-09-21 DIAGNOSIS — I472 Ventricular tachycardia, unspecified: Secondary | ICD-10-CM

## 2022-09-22 LAB — CUP PACEART REMOTE DEVICE CHECK
Battery Remaining Longevity: 44 mo
Battery Remaining Percentage: 45 %
Battery Voltage: 2.9 V
Brady Statistic RV Percent Paced: 4.9 %
Date Time Interrogation Session: 20240326020016
HighPow Impedance: 69 Ohm
HighPow Impedance: 69 Ohm
Implantable Lead Connection Status: 753985
Implantable Lead Implant Date: 20161031
Implantable Lead Location: 753860
Implantable Lead Model: 181
Implantable Lead Serial Number: 333140
Implantable Pulse Generator Implant Date: 20161031
Lead Channel Impedance Value: 310 Ohm
Lead Channel Pacing Threshold Amplitude: 1 V
Lead Channel Pacing Threshold Pulse Width: 0.5 ms
Lead Channel Sensing Intrinsic Amplitude: 5.8 mV
Lead Channel Setting Pacing Amplitude: 2.5 V
Lead Channel Setting Pacing Pulse Width: 0.5 ms
Lead Channel Setting Sensing Sensitivity: 0.5 mV
Pulse Gen Serial Number: 7308468

## 2022-10-06 ENCOUNTER — Other Ambulatory Visit: Payer: Self-pay | Admitting: *Deleted

## 2022-10-06 DIAGNOSIS — I6521 Occlusion and stenosis of right carotid artery: Secondary | ICD-10-CM

## 2022-10-13 ENCOUNTER — Ambulatory Visit (HOSPITAL_COMMUNITY)
Admission: RE | Admit: 2022-10-13 | Discharge: 2022-10-13 | Disposition: A | Discharge status: Home / Self Care | Payer: Medicare Other | Source: Ambulatory Visit | Attending: Vascular Surgery | Admitting: Vascular Surgery

## 2022-10-13 ENCOUNTER — Ambulatory Visit (INDEPENDENT_AMBULATORY_CARE_PROVIDER_SITE_OTHER): Payer: Medicare Other | Admitting: Vascular Surgery

## 2022-10-13 ENCOUNTER — Encounter: Payer: Self-pay | Admitting: Vascular Surgery

## 2022-10-13 VITALS — BP 134/66 | HR 51 | Temp 97.9°F | Resp 16 | Ht 68.0 in | Wt 188.0 lb

## 2022-10-13 DIAGNOSIS — I6521 Occlusion and stenosis of right carotid artery: Secondary | ICD-10-CM

## 2022-10-13 NOTE — Progress Notes (Signed)
Patient name: Victor Estes MRN: [id number] DOB: [birth year] Sex: male  REASON FOR CONSULT: 9 month follow-up after right TCAR  HPI: Victor Estes is a 77 y.o. male, with history of hypertension, hyperlipidemia, coronary artery disease status post four-vessel CABG in 1995 that presents for 9 month follow-up after right TCAR.  Patient has been followed by Dr. Allyson Sabal for many years.  TCAR was on 11/28/2021 for high-grade asymptomatic stenosis.  Patient did well after surgery with no issues.    No new neurologic events since last follow-up.  Taking aspirin, plavix, statin.  No new concerns today.  Past Medical History:  Diagnosis Date   AICD (automatic cardioverter/defibrillator) present    Arthritis    Cancer    Carotid artery disease    Diabetes mellitus without complication    GERD (gastroesophageal reflux disease)    Hyperlipemia    Hypertension    Dr Allyson Sabal   Myocardial infarct    CABG-1995- LIMA-D1-LAD, SVG-OM, SVG-RCA    Ventricular tachycardia, sustained 04/27/2015    Past Surgical History:  Procedure Laterality Date   BACK SURGERY     CARDIAC CATHETERIZATION N/A 04/27/2015   Severe native dz, LIMA-LAD & SVG-OM OK, SVG-RCA chronically occluded. Left Heart Cath and Coronary Angiography;  Surgeon: Tonny Bollman, MD;  Location: [facility] INVASIVE CV LAB;  Service: Cardiovascular;  Laterality: N/A;   CHOLECYSTECTOMY     COLON SURGERY     CORONARY ARTERY BYPASS GRAFT  1995    LIMA-D1-LAD, SVG-OM, SVG-RCA, Dr Andrey Campanile   ELBOW SURGERY     EP IMPLANTABLE DEVICE N/A 04/29/2015   Procedure: ICD Implant;  Surgeon: Duke Salvia, MD;  Location: [facility] INVASIVE CV LAB;  Service: Cardiovascular;  Laterality: N/A; St Jude  ICD, serial number  [id number].   LUMBAR LAMINECTOMY/DECOMPRESSION MICRODISCECTOMY  09/09/2011   Procedure: LUMBAR LAMINECTOMY/DECOMPRESSION MICRODISCECTOMY 1 LEVEL;  Surgeon: Tia Alert, MD;  Location: [facility] NEURO ORS;  Service: Neurosurgery;  Laterality: Right;  Right  Lumbar Five-Sacral One Hemilaminectomy    RIGHT/LEFT HEART CATH AND CORONARY/GRAFT ANGIOGRAPHY N/A 09/24/2020   Procedure: RIGHT/LEFT HEART CATH AND CORONARY/GRAFT ANGIOGRAPHY;  Surgeon: Lennette Bihari, MD;  Location: [facility] INVASIVE CV LAB;  Service: Cardiovascular;  Laterality: N/A;   TOTAL KNEE ARTHROPLASTY Left 07/26/2013   Procedure: TOTAL KNEE ARTHROPLASTY;  Surgeon: Loreta Ave, MD;  Location: [facility] OR;  Service: Orthopedics;  Laterality: Left;   TRANSCAROTID ARTERY REVASCULARIZATION  Right 11/28/2021   Procedure: Transcarotid Artery Revascularization;  Surgeon: Cephus Shelling, MD;  Location: [facility] OR;  Service: Vascular;  Laterality: Right;   ULTRASOUND GUIDANCE FOR VASCULAR ACCESS Left 11/28/2021   Procedure: ULTRASOUND GUIDANCE FOR VASCULAR ACCESS;  Surgeon: Cephus Shelling, MD;  Location: [facility] OR;  Service: Vascular;  Laterality: Left;    Family History  Problem Relation Age of Onset   Heart failure Mother    Hypertension Mother    Hypertension Other     SOCIAL HISTORY: Social History   Socioeconomic History   Marital status: Married    Spouse name: Licensed conveyancer   Number of children: 2   Years of education: Not on file   Highest education level: High school graduate  Occupational History   Occupation: Owns a Teaching laboratory technician  Tobacco Use   Smoking status: Former    Types: Cigarettes    Quit date: 04/06/1983    Years since quitting: 39.5   Smokeless tobacco: Never  Vaping Use   Vaping Use: Never used  Substance and Sexual  Activity   Alcohol use: No    Alcohol/week: 0.0 standard drinks of alcohol   Drug use: No   Sexual activity: Not on file  Other Topics Concern   Not on file  Social History Narrative   Not on file   Social Determinants of Health   Financial Resource Strain: Low Risk  (09/25/2020)   Overall Financial Resource Strain (CARDIA)    Difficulty of Paying Living Expenses: Not hard at all  Food Insecurity: No Food Insecurity (09/25/2020)    Hunger Vital Sign    Worried About Running Out of Food in the Last Year: Never true    Ran Out of Food in the Last Year: Never true  Transportation Needs: No Transportation Needs (09/25/2020)   PRAPARE - Administrator, Civil Service (Medical): No    Lack of Transportation (Non-Medical): No  Physical Activity: Insufficiently Active (09/25/2020)   Exercise Vital Sign    Days of Exercise per Week: 3 days    Minutes of Exercise per Session: 30 min  Stress: Not on file  Social Connections: Not on file  Intimate Partner Violence: Not on file    Allergies  Allergen Reactions   Clarithromycin Swelling    Current Outpatient Medications  Medication Sig Dispense Refill   acetaminophen (TYLENOL) 325 MG tablet Take 650 mg by mouth every 6 (six) hours as needed for mild pain, moderate pain, fever or headache.     Alum Hydroxide-Mag Carbonate (ACID GONE ANTACID PO) Take 1 tablet by mouth 3 (three) times daily as needed (acid reflux).     amLODipine (NORVASC) 5 MG tablet Take 5 mg by mouth every morning.     aspirin 81 MG EC tablet Take 1 tablet (81 mg total) by mouth daily with breakfast. 30 tablet 11   atorvastatin (LIPITOR) 80 MG tablet Take 1 tablet (80 mg total) by mouth at bedtime. 90 tablet 3   Carboxymethylcellulose Sodium 0.25 % SOLN Place 1 drop into both eyes 2 (two) times daily as needed (dry eyes).     carvedilol (COREG) 12.5 MG tablet Take 1 tablet (12.5 mg total) by mouth 2 (two) times daily with a meal. 180 tablet 3   clopidogrel (PLAVIX) 75 MG tablet Take 1 tablet (75 mg total) by mouth daily. 30 tablet 6   empagliflozin (JARDIANCE) 25 MG TABS tablet Take 25 mg by mouth daily. Take on half tablet my mouth every morning     ENTRESTO 49-51 MG TAKE 1 TABLET BY MOUTH TWICE DAILY (Patient taking differently: 1 tablet 2 (two) times daily.) 60 tablet 11   ezetimibe (ZETIA) 10 MG tablet Take 10 mg by mouth daily.     meloxicam (MOBIC) 7.5 MG tablet Take 7.5 mg by mouth daily as  needed for pain.     metformin (FORTAMET) 500 MG (OSM) 24 hr tablet Take 1 tablet (500 mg total) by mouth daily with breakfast. 90 tablet 1   methocarbamol (ROBAXIN) 500 MG tablet Take 1 tablet (500 mg total) by mouth at bedtime. 30 tablet 11   oxyCODONE-acetaminophen (PERCOCET/ROXICET) 5-325 MG tablet Take 1-2 tablets by mouth every 6 (six) hours as needed for moderate pain. 12 tablet 0   pantoprazole (PROTONIX) 40 MG tablet Take 1 tablet (40 mg total) by mouth 2 (two) times daily. 180 tablet 3   tamsulosin (FLOMAX) 0.4 MG CAPS capsule Take 0.4 mg by mouth daily after breakfast.     furosemide (LASIX) 20 MG tablet Take 1 tablet (20 mg total) by  mouth daily as needed (weight gain (3lbs in 1 day or 5lbs in 1 week)). 30 tablet 11   nitroGLYCERIN (NITROSTAT) 0.4 MG SL tablet Place 1 tablet (0.4 mg total) under the tongue every 5 (five) minutes as needed for chest pain. 25 tablet 2   spironolactone (ALDACTONE) 25 MG tablet TAKE 1/2 TABLET (12.5 MG TOTAL) BY MOUTH DAILY. 15 tablet 11   No current facility-administered medications for this visit.    REVIEW OF SYSTEMS:   denotes positive finding,  denotes negative finding Cardiac  Comments:  Chest pain or chest pressure:    Shortness of breath upon exertion:    Short of breath when lying flat:    Irregular heart rhythm:        Vascular    Pain in calf, thigh, or hip brought on by ambulation:    Pain in feet at night that wakes you up from your sleep:     Blood clot in your veins:    Leg swelling:         Pulmonary    Oxygen at home:    Productive cough:     Wheezing:         Neurologic    Sudden weakness in arms or legs:     Sudden numbness in arms or legs:     Sudden onset of difficulty speaking or slurred speech:    Temporary loss of vision in one eye:     Problems with dizziness:         Gastrointestinal    Blood in stool:     Vomited blood:         Genitourinary    Burning when urinating:     Blood in urine:         Psychiatric    Major depression:         Hematologic    Bleeding problems:    Problems with blood clotting too easily:        Skin    Rashes or ulcers:        Constitutional    Fever or chills:      PHYSICAL EXAM: Vitals:   10/13/22 1554 10/13/22 1557  BP: (!) 151/70 134/66  Pulse: (!) 51 (!) 51  Resp: 16   Temp: 97.9 F (36.6 C)   TempSrc: Temporal   SpO2: 96%   Weight: 188 lb (85.3 kg)   Height:  (1.727 m)     GENERAL: The patient is a well-nourished male, in no acute distress. The vital signs are documented above. CARDIAC: There is a regular rate and rhythm.  VASCULAR:  Right neck incision healed NEUROLOGIC: No focal weakness or paresthesias are detected.  CN II-XII grossly intact. SKIN: There are no ulcers or rashes noted. PSYCHIATRIC: The patient has a normal affect.  DATA:   Carotid duplex today shows widely patent right ICA stent with minimal 1 to 39% stenosis in the left ICA  Assessment/Plan:  77 year old male presents for 9 month follow-up after right TCAR on 11/28/2021 for high-grade asymptomatic stenosis.  His carotid duplex today shows his right carotid stent is widely patent.  He has minimal 1 to 39% stenosis on the contralateral side.  No new neurologic events since last evaluation.  Pleased with his studies today.  I will see him again in 1 year with carotid duplex for surveillance.  Will remain on aspirin statin Plavix for risk reduction.  Cephus Shelling, MD [facility]  Office: [phone]

## 2022-11-03 NOTE — Progress Notes (Signed)
Remote ICD transmission.   

## 2022-12-21 ENCOUNTER — Ambulatory Visit: Payer: Medicare Other

## 2022-12-21 DIAGNOSIS — I472 Ventricular tachycardia, unspecified: Secondary | ICD-10-CM | POA: Diagnosis not present

## 2022-12-22 LAB — CUP PACEART REMOTE DEVICE CHECK
Battery Remaining Longevity: 42 mo
Battery Remaining Percentage: 42 %
Battery Voltage: 2.89 V
Brady Statistic RV Percent Paced: 5.8 %
Date Time Interrogation Session: 20240625020015
HighPow Impedance: 59 Ohm
HighPow Impedance: 59 Ohm
Implantable Lead Connection Status: 753985
Implantable Lead Implant Date: 20161031
Implantable Lead Location: 753860
Implantable Lead Model: 181
Implantable Lead Serial Number: 333140
Implantable Pulse Generator Implant Date: 20161031
Lead Channel Impedance Value: 330 Ohm
Lead Channel Pacing Threshold Amplitude: 1 V
Lead Channel Pacing Threshold Pulse Width: 0.5 ms
Lead Channel Sensing Intrinsic Amplitude: 7.3 mV
Lead Channel Setting Pacing Amplitude: 2.5 V
Lead Channel Setting Pacing Pulse Width: 0.5 ms
Lead Channel Setting Sensing Sensitivity: 0.5 mV
Pulse Gen Serial Number: 7308468

## 2023-01-11 NOTE — Progress Notes (Signed)
 Remote ICD transmission.   

## 2023-03-22 ENCOUNTER — Ambulatory Visit (INDEPENDENT_AMBULATORY_CARE_PROVIDER_SITE_OTHER): Payer: Medicare Other

## 2023-03-22 DIAGNOSIS — I472 Ventricular tachycardia, unspecified: Secondary | ICD-10-CM

## 2023-03-23 LAB — CUP PACEART REMOTE DEVICE CHECK
Battery Remaining Longevity: 41 mo
Battery Remaining Percentage: 41 %
Battery Voltage: 2.89 V
Brady Statistic RV Percent Paced: 7.4 %
Date Time Interrogation Session: 20240924020016
HighPow Impedance: 70 Ohm
HighPow Impedance: 70 Ohm
Implantable Lead Connection Status: 753985
Implantable Lead Implant Date: 20161031
Implantable Lead Location: 753860
Implantable Lead Model: 181
Implantable Lead Serial Number: 333140
Implantable Pulse Generator Implant Date: 20161031
Lead Channel Impedance Value: 340 Ohm
Lead Channel Pacing Threshold Amplitude: 1 V
Lead Channel Pacing Threshold Pulse Width: 0.5 ms
Lead Channel Sensing Intrinsic Amplitude: 9 mV
Lead Channel Setting Pacing Amplitude: 2.5 V
Lead Channel Setting Pacing Pulse Width: 0.5 ms
Lead Channel Setting Sensing Sensitivity: 0.5 mV
Pulse Gen Serial Number: 7308468

## 2023-04-06 NOTE — Progress Notes (Signed)
Remote ICD transmission.   

## 2023-05-12 ENCOUNTER — Telehealth: Payer: Self-pay | Admitting: Cardiovascular Disease

## 2023-05-12 NOTE — Telephone Encounter (Signed)
Patient dropped off lab results from [place] [facility] providers box

## 2023-06-21 ENCOUNTER — Ambulatory Visit (INDEPENDENT_AMBULATORY_CARE_PROVIDER_SITE_OTHER): Payer: Medicare Other

## 2023-06-21 DIAGNOSIS — I472 Ventricular tachycardia, unspecified: Secondary | ICD-10-CM | POA: Diagnosis not present

## 2023-06-22 ENCOUNTER — Telehealth: Payer: Self-pay

## 2023-06-22 DIAGNOSIS — I472 Ventricular tachycardia, unspecified: Secondary | ICD-10-CM

## 2023-06-22 LAB — CUP PACEART REMOTE DEVICE CHECK
Battery Remaining Longevity: 37 mo
Battery Remaining Percentage: 37 %
Battery Voltage: 2.86 V
Brady Statistic RV Percent Paced: 9.5 %
Date Time Interrogation Session: 20241223020017
HighPow Impedance: 56 Ohm
HighPow Impedance: 56 Ohm
Implantable Lead Connection Status: 753985
Implantable Lead Implant Date: 20161031
Implantable Lead Location: 753860
Implantable Lead Model: 181
Implantable Lead Serial Number: 333140
Implantable Pulse Generator Implant Date: 20161031
Lead Channel Impedance Value: 350 Ohm
Lead Channel Pacing Threshold Amplitude: 1 V
Lead Channel Pacing Threshold Pulse Width: 0.5 ms
Lead Channel Sensing Intrinsic Amplitude: 11.7 mV
Lead Channel Setting Pacing Amplitude: 2.5 V
Lead Channel Setting Pacing Pulse Width: 0.5 ms
Lead Channel Setting Sensing Sensitivity: 0.5 mV
Pulse Gen Serial Number: 7308468

## 2023-06-22 NOTE — Telephone Encounter (Signed)
LM to tell pt about new med and lab orders

## 2023-06-22 NOTE — Telephone Encounter (Signed)
Please advise 

## 2023-06-22 NOTE — Telephone Encounter (Signed)
Attempted to call pt. No answer, LMTCB.

## 2023-06-22 NOTE — Telephone Encounter (Signed)
Pt symptomatic at time of event. Was moving a cabinet w/ family;picked it up and immediately felt bad. Near syncopal. Lasted around 20 seconds. Sat down and drank some water. Pt made aware of driving restrictions, ED precautions, and medication regimen compliance importance. Reviewed meds. Sent to MD for advisement.

## 2023-06-22 NOTE — Telephone Encounter (Signed)
Alert received from [employer] for Device alert for sustained VT with successful ATP Events occurred 12/23 @ 14:49, EGM c/w sustained VT, ATP delivered x1 with brief termination, return to VT, ATP delivered a second time converting arrhythmia. HR's 203-206 Route to triage high alert per protocol.  Pt is overdue and needs apt with provider. Will call pt after 8 am to discuss and advise need apt.

## 2023-06-24 ENCOUNTER — Encounter: Payer: Medicare Other | Admitting: Physician Assistant

## 2023-06-25 ENCOUNTER — Other Ambulatory Visit: Payer: Self-pay

## 2023-06-25 DIAGNOSIS — I472 Ventricular tachycardia, unspecified: Secondary | ICD-10-CM

## 2023-06-25 NOTE — Addendum Note (Signed)
Addended by: Skip Mayer on: 06/25/2023 08:43 AM   Modules accepted: Orders

## 2023-06-26 LAB — BASIC METABOLIC PANEL
BUN/Creatinine Ratio: 12 (ref 10–24)
BUN: 12 mg/dL (ref 8–27)
CO2: 23 mmol/L (ref 20–29)
Calcium: 9.2 mg/dL (ref 8.6–10.2)
Chloride: 103 mmol/L (ref 96–106)
Creatinine, Ser: 0.97 mg/dL (ref 0.76–1.27)
Glucose: 129 mg/dL — ABNORMAL HIGH (ref 70–99)
Potassium: 3.8 mmol/L (ref 3.5–5.2)
Sodium: 143 mmol/L (ref 134–144)
eGFR: 80 mL/min/{1.73_m2} (ref >59)

## 2023-06-26 LAB — MAGNESIUM: Magnesium: 1.9 mg/dL (ref 1.6–2.3)

## 2023-06-28 ENCOUNTER — Telehealth: Payer: Self-pay | Admitting: Pharmacy Technician

## 2023-06-28 ENCOUNTER — Telehealth: Payer: Self-pay | Admitting: Internal Medicine

## 2023-06-28 ENCOUNTER — Other Ambulatory Visit (HOSPITAL_COMMUNITY): Payer: Self-pay

## 2023-06-28 MED ORDER — MEXILETINE HCL 250 MG PO CAPS: 250.0000 mg | ORAL_CAPSULE | Freq: Two times a day (BID) | ORAL | 3 refills | Status: DC

## 2023-06-28 NOTE — Telephone Encounter (Signed)
Pharmacy Patient Advocate Encounter   Received notification from Fax that prior authorization for Mexiletine HCl 250MG  capsules is required/requested.   Insurance verification completed.   The patient is insured through [facility] .   Per test claim: PA required; PA submitted to above mentioned insurance via CoverMyMeds Key/confirmation #/EOC [facility] Status is pending

## 2023-06-28 NOTE — Telephone Encounter (Signed)
Pharmacy Patient Advocate Encounter  Received notification from [facility] that Prior Authorization for Mexiletine HCl 250MG  capsules has been APPROVED from 06/28/23 to 06/28/2098. Ran test claim, Copay is $27.30. This test claim was processed through [facility]- copay amounts may vary at other pharmacies due to pharmacy/plan contracts, or as the patient moves through the different stages of their insurance plan.   PA #/Case ID/Reference #: [id number]

## 2023-06-28 NOTE — Telephone Encounter (Signed)
Returning call to nurse °

## 2023-06-28 NOTE — Telephone Encounter (Signed)
Needs labs and start Mexiletine. See previous phone note

## 2023-06-28 NOTE — Telephone Encounter (Signed)
  Patient called, advised to start mexiletine 250 mg BID. Script sent to pharmacy, pharmacy verified. Pt appreciative of call and made aware of f/u apt. 07/01/22.

## 2023-07-01 NOTE — Progress Notes (Signed)
 Electrophysiology Office Note:   Date:  07/02/2023  ID:  Victor Estes, DOB [birth year], MRN [id number]  Primary Cardiologist: Dorn Lesches, MD Primary Heart Failure: None Electrophysiologist: Elspeth Sage, MD      History of Present Illness:   Victor Estes is a 78 y.o. male with h/o VT s/p ICD, CAD s/p CABG, carotid disease on R, sCHF seen today for acute visit due to VT.    Device clinic alerted of sustained VT 06/21/23 with successful ATP x1 > brief termination > VT, ATP delivered a second time converting arrhythmia. The patient was symptomatic with the event.  He was moving a cabinet, picked it up and became near syncopal lasting approximately 20 seconds.  He was started on mexiletine 250mg  BID, K 3.8 / Mg+ 1.9. He has not had further episodes since that time. 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   He denies chest pain, palpitations, dyspnea, PND, orthopnea, nausea, vomiting, dizziness, syncope, edema, weight gain, or early satiety.   Review of systems complete and found to be negative unless listed in HPI.    EP Information / Studies Reviewed:    EKG is ordered today. Personal review as below.  EKG Interpretation Date/Time:  Friday July 02 2023 09:39:10 EST Ventricular Rate:  53 PR Interval:    QRS Duration:  112 QT Interval:  430 QTC Calculation: 403 R Axis:   83  Text Interpretation: Atrial flutter with variable A-V block with occasional ventricular-paced complexes Confirmed by Aniceto Jarvis ([id number]) on 07/02/2023 10:07:05 AM   ICD Interrogation-  reviewed in detail today,  See PACEART report.  Device History: Abbott Single Chamber ICD implanted 04/29/15 for VT History of appropriate therapy: Yes History of AAD therapy: Yes; currently on Mexiletine    Studies:  LHC 08/2020 > 3V CAD, LIMA-LAD-D1 patent, SVG-OM2 patent, SVG-RCA 100% Myoview  08/2020 > inferior fixed defect ECHO 10/2021 > LVEF 30-35%    Arrhythmia / AAD VT  Mexiletine > initiated after VT  with ATP x2 06/21/23   Risk Assessment/Calculations:    CHA2DS2-VASc Score = 6   This indicates a 9.7% annual risk of stroke. The patient's score is based upon: CHF History: 1 HTN History: 1 Diabetes History: 1 Stroke History: 0 Vascular Disease History: 1 Age Score: 2 Gender Score: 0             Physical Exam:   VS:  BP 120/62   Pulse (!) 53   Ht 5' 8 (1.727 m)   Wt 192 lb 12.8 oz (87.5 kg)   SpO2 96%   BMI 29.32 kg/m    Wt Readings from Last 3 Encounters:  07/02/23 192 lb 12.8 oz (87.5 kg)  10/13/22 188 lb (85.3 kg)  01/06/22 187 lb (84.8 kg)     GEN: Well nourished, well developed in no acute distress NECK: No JVD; No carotid bruits CARDIAC: Irregularly irregular rate and rhythm, no murmurs, rubs, gallops RESPIRATORY:  Clear to auscultation without rales, wheezing or rhonchi  ABDOMEN: Soft, non-tender, non-distended EXTREMITIES:  No edema; No deformity   ASSESSMENT AND PLAN:    VT / Chronic Systolic Dysfunction s/p Abbott single chamber ICD  CAD, ICM s/p CABG  -euvolemic today -Stable on an appropriate medical regimen -Normal ICD function > appropriate ATP on 06/21/23 -See Pace Art report -No changes today -GDMT per HF Team  -continue mexiletine  -no further VT on device   -no anginal symptoms  AFL  -new on EKG 07/02/23  -single chamber device but  had an episode in 07/2022 and in 06/29/2023 that was irregular that could have been RVR / conduction of AF/AFL  -CHA2DS2-VASc 6, begin eliquis  5mg  BID  -resume beta blocker (pt not taking coreg ) > Toprol  25mg  at bedtime  -follow up in 3 weeks with plan for DCCV at that time if remains in AFL > scheduled for 07/25/22 -pt had recent BMP, assess CBC for baseline eliquis  lab  Hypertension  -well controlled on current regimen     Disposition:   Follow up with EP APP  3 weeks to review EKG, arrange DCCV      Signed, Daphne Barrack, MSN, APRN, NP-C, AGACNP-BC [facility] - Electrophysiology   07/02/2023, 1:39 PM

## 2023-07-02 ENCOUNTER — Encounter: Payer: Self-pay | Admitting: Pulmonary Disease

## 2023-07-02 ENCOUNTER — Ambulatory Visit: Payer: Medicare Other | Attending: Pulmonary Disease | Admitting: Pulmonary Disease

## 2023-07-02 ENCOUNTER — Encounter: Payer: Self-pay | Admitting: *Deleted

## 2023-07-02 VITALS — BP 120/62 | HR 53 | Ht 68.0 in | Wt 192.8 lb

## 2023-07-02 DIAGNOSIS — I2510 Atherosclerotic heart disease of native coronary artery without angina pectoris: Secondary | ICD-10-CM | POA: Diagnosis not present

## 2023-07-02 DIAGNOSIS — Z95810 Presence of automatic (implantable) cardiac defibrillator: Secondary | ICD-10-CM | POA: Diagnosis not present

## 2023-07-02 DIAGNOSIS — I472 Ventricular tachycardia, unspecified: Secondary | ICD-10-CM | POA: Insufficient documentation

## 2023-07-02 DIAGNOSIS — I255 Ischemic cardiomyopathy: Secondary | ICD-10-CM | POA: Diagnosis not present

## 2023-07-02 LAB — CUP PACEART INCLINIC DEVICE CHECK
Battery Remaining Longevity: 38 mo
Brady Statistic RV Percent Paced: 9.7 %
Date Time Interrogation Session: 20250103134359
HighPow Impedance: 67.5 Ohm
Implantable Lead Connection Status: 753985
Implantable Lead Implant Date: 20161031
Implantable Lead Location: 753860
Implantable Lead Model: 181
Implantable Lead Serial Number: 333140
Implantable Pulse Generator Implant Date: 20161031
Lead Channel Impedance Value: 325 Ohm
Lead Channel Pacing Threshold Amplitude: 1 V
Lead Channel Pacing Threshold Amplitude: 1 V
Lead Channel Pacing Threshold Pulse Width: 0.5 ms
Lead Channel Pacing Threshold Pulse Width: 0.5 ms
Lead Channel Sensing Intrinsic Amplitude: 6.8 mV
Lead Channel Setting Pacing Amplitude: 2.5 V
Lead Channel Setting Pacing Pulse Width: 0.5 ms
Lead Channel Setting Sensing Sensitivity: 0.5 mV
Pulse Gen Serial Number: 7308468

## 2023-07-02 MED ORDER — APIXABAN 5 MG PO TABS: 5.0000 mg | ORAL_TABLET | Freq: Two times a day (BID) | ORAL | 3 refills | Status: DC

## 2023-07-02 MED ORDER — METOPROLOL SUCCINATE ER 25 MG PO TB24: 25.0000 mg | ORAL_TABLET | Freq: Every day | ORAL | 3 refills | Status: DC

## 2023-07-02 MED ORDER — APIXABAN 5 MG PO TABS: 5.0000 mg | ORAL_TABLET | Freq: Two times a day (BID) | ORAL | 0 refills | Status: DC

## 2023-07-02 NOTE — Patient Instructions (Addendum)
 Medication Instructions:   START TAKING:  TOPROL  XL  25 MG ONCE A DAY   START TAKING : ELIQUIS  5 MG TWICE  A DAY     *If you need a refill on your cardiac medications before your next appointment, please call your pharmacy*    Lab Work:   PLEASE GO DOWN STAIRS FIRST FLOOR  [address] :  CBC TODAY     If you have labs (blood work) drawn today and your tests are completely normal, you will receive your results only by: MyChart Message (if you have MyChart) OR A paper copy in the mail If you have any lab test that is abnormal or we need to change your treatment, we will call you to review the results.   Testing/Procedures:   ON 07-26-23 Your physician has recommended that you have a Cardioversion (DCCV). Electrical Cardioversion uses a jolt of electricity to your heart either through paddles or wired patches attached to your chest. This is a controlled, usually prescheduled, procedure. Defibrillation is done under light anesthesia in the hospital, and you usually go home the day of the procedure. This is done to get your heart back into a normal rhythm. You are not awake for the procedure. Please see the instruction sheet given to you today.      Follow-Up: At [facility], you and your health needs are our priority.  As part of our continuing mission to provide you with exceptional heart care, we have created designated Provider Care Teams.  These Care Teams include your primary Cardiologist (physician) and Advanced Practice Providers (APPs -  Physician Assistants and Nurse Practitioners) who all work together to provide you with the care you need, when you need it.  We recommend signing up for the patient portal called MyChart.  Sign up information is provided on this After Visit Summary.  MyChart is used to connect with patients for Virtual Visits (Telemedicine).  Patients are able to view lab/test results, encounter notes, upcoming appointments, etc.  Non-urgent messages can be  sent to your provider as well.   To learn more about what you can do with MyChart, go to [email or web address].    Your next appointment:    3  week(s)  (before 07-26-23  cardioversion )( CONTACT  CASSIE HALL/ ANGELINE HAMMER FOR EP SCHEDULING ISSUES )   Provider:   You may see : Daphne Barrack, NP   Other Instructions

## 2023-07-03 LAB — CBC
Hematocrit: 44.4 % (ref 37.5–51.0)
Hemoglobin: 15.2 g/dL (ref 13.0–17.7)
MCH: 31 pg (ref 26.6–33.0)
MCHC: 34.2 g/dL (ref 31.5–35.7)
MCV: 91 fL (ref 79–97)
Platelets: 258 10*3/uL (ref 150–450)
RBC: 4.9 x10E6/uL (ref 4.14–5.80)
RDW: 13.2 % (ref 11.6–15.4)
WBC: 8 10*3/uL (ref 3.4–10.8)

## 2023-07-20 NOTE — Progress Notes (Deleted)
Electrophysiology Office Note:   Date:  07/20/2023  ID:  Victor Estes, DOB Dec 19, 1945, MRN [id number]  Primary Cardiologist: Nanetta Batty, MD Primary Heart Failure: None Electrophysiologist: Sherryl Manges, MD   {Click to update primary MD,subspecialty MD or APP then REFRESH:1}    History of Present Illness:   Victor Estes is a 78 y.o. male with h/o VT s/p ICD, CAD s/p CABG, carotid disease on R, sCHF seen today for routine electrophysiology followup.   Seen in clinic 07/01/22 after recent ATP x1 > brief termination > VT, ATP delivered a second time converting rhythm.  He was started on mexiletine after remote alert.  No further episodes.    Since last being seen in our clinic the patient reports doing ***.  he denies chest pain, palpitations, dyspnea, PND, orthopnea, nausea, vomiting, dizziness, syncope, edema, weight gain, or early satiety.   Review of systems complete and found to be negative unless listed in HPI.   EP Information / Studies Reviewed:    EKG is ordered today. Personal review as below.      ICD Interrogation-  reviewed in detail today,  See PACEART report.  Device History: Abbott Single Chamber ICD implanted 04/29/15 for VT History of appropriate therapy: Yes History of AAD therapy: Yes; currently on mexiletine     Studies:  LHC 08/2020 > 3V CAD, LIMA-LAD-D1 patent, SVG-OM2 patent, SVG-RCA 100% Myoview 08/2020 > inferior fixed defect ECHO 10/2021 > LVEF 30-35%      Arrhythmia / AAD VT  Mexiletine > initiated after VT with ATP x2 06/21/23  Risk Assessment/Calculations:    CHA2DS2-VASc Score = 6  {Confirm score is correct.  If not, click here to update score.  REFRESH note.  :1} This indicates a 9.7% annual risk of stroke. The patient's score is based upon: CHF History: 1 HTN History: 1 Diabetes History: 1 Stroke History: 0 Vascular Disease History: 1 Age Score: 2 Gender Score: 0   {This patient has a significant risk of stroke if diagnosed  with atrial fibrillation.  Please consider VKA or DOAC agent for anticoagulation if the bleeding risk is acceptable.   You can also use the SmartPhrase .[email or web address] for documentation.   :[id number]} No BP recorded.  {Refresh Note OR Click here to enter BP  :1}***        Physical Exam:   VS:  There were no vitals taken for this visit.   Wt Readings from Last 3 Encounters:  07/02/23 192 lb 12.8 oz (87.5 kg)  10/13/22 188 lb (85.3 kg)  01/06/22 187 lb (84.8 kg)     GEN: Well nourished, well developed in no acute distress NECK: No JVD; No carotid bruits CARDIAC: {EPRHYTHM:28826}, no murmurs, rubs, gallops RESPIRATORY:  Clear to auscultation without rales, wheezing or rhonchi  ABDOMEN: Soft, non-tender, non-distended EXTREMITIES:  No edema; No deformity   ASSESSMENT AND PLAN:    VT, Chronic Systolic Dysfunction s/p Abbott single chamber ICD  -euvolemic today -Stable on an appropriate medical regimen -Normal ICD function -See Pace Art report -No changes today -no driving for 6 mo from 56/31/49 *** -GDMT per HF Team  -euvolemic on exam ***  AFL  CHA2DS2-VASc 6 New on EKG 07/02/23. Has single chamber device but episode on 07/2022 and 06/29/23 that was irregular on EGM that could have been AF/AFL -continue Toprol 25mg  at bedtime  -anticoagulation for stroke prophylaxis  -if remains in AFL, move forward with DCCV planned for 07/26/23 *** -repeat BMP, Mg+, CBC  Secondary Hypercoagulable State  -  continue Eliquis 5mg  BID, dose reviewed and appropriate by age/wt  Hypertension  -well controlled on current regimen ***   Disposition:   Follow up with {EPPROVIDERS:28135} {EPFOLLOW QM:57846}   Signed, Canary Brim, NP

## 2023-07-21 ENCOUNTER — Ambulatory Visit: Payer: Medicare Other | Attending: Pulmonary Disease | Admitting: Pulmonary Disease

## 2023-07-21 ENCOUNTER — Ambulatory Visit: Payer: Medicare Other | Admitting: Pulmonary Disease

## 2023-07-21 ENCOUNTER — Encounter: Payer: Self-pay | Admitting: Pulmonary Disease

## 2023-07-21 ENCOUNTER — Other Ambulatory Visit: Payer: Self-pay | Admitting: Pulmonary Disease

## 2023-07-21 VITALS — BP 152/70 | HR 66 | Ht 67.0 in | Wt 191.0 lb

## 2023-07-21 DIAGNOSIS — I4892 Unspecified atrial flutter: Secondary | ICD-10-CM

## 2023-07-21 DIAGNOSIS — I255 Ischemic cardiomyopathy: Secondary | ICD-10-CM

## 2023-07-21 DIAGNOSIS — I472 Ventricular tachycardia, unspecified: Secondary | ICD-10-CM

## 2023-07-21 DIAGNOSIS — D6869 Other thrombophilia: Secondary | ICD-10-CM

## 2023-07-21 DIAGNOSIS — I10 Essential (primary) hypertension: Secondary | ICD-10-CM

## 2023-07-21 DIAGNOSIS — Z95810 Presence of automatic (implantable) cardiac defibrillator: Secondary | ICD-10-CM

## 2023-07-21 NOTE — H&P (View-Only) (Signed)
Electrophysiology Office Note:   Date:  07/21/2023  ID:  Victor Estes, DOB 12/02/1945, MRN [id number]  Primary Cardiologist: Nanetta Batty, MD Primary Heart Failure: None Electrophysiologist: Sherryl Manges, MD       History of Present Illness:   Victor Estes is a 78 y.o. male with h/o VT s/p ICD, CAD s/p CABG, carotid disease on R, sCHF seen today for routine electrophysiology followup.   Seen in clinic 07/01/22 after recent ATP x1 > brief termination > VT, ATP delivered a second time converting rhythm.  He was started on mexiletine after remote alert.  No further episodes.  In clinic, he was also identified to have AFL.  He was started on [facility] and Toprol at that time.   Since last being seen in our clinic the patient reports doing well since last visit. He has not missed doses of Eliquis. He reports he is tolerating the Toprol. Has been largely asymptomatic from AFL/AF.   He denies chest pain, palpitations, dyspnea, PND, orthopnea, nausea, vomiting, dizziness, syncope, edema, weight gain, or early satiety.   Review of systems complete and found to be negative unless listed in HPI.   EP Information / Studies Reviewed:    EKG is ordered today. Personal review as below.  EKG Interpretation Date/Time:  Wednesday July 21 2023 13:57:29 EST Ventricular Rate:  66 PR Interval:    QRS Duration:  108 QT Interval:  408 QTC Calculation: 427 R Axis:   5  Text Interpretation: Coarse Atrial fibrillation Confirmed by Canary Brim ([id number]) on 07/21/2023 2:52:02 PM   ICD Interrogation-  reviewed in detail today,  See PACEART report.  Device History: Abbott Single Chamber ICD implanted 04/29/15 for VT History of appropriate therapy: Yes History of AAD therapy: Yes; currently on mexiletine     Studies:  LHC 08/2020 > 3V CAD, LIMA-LAD-D1 patent, SVG-OM2 patent, SVG-RCA 100% Myoview 08/2020 > inferior fixed defect ECHO 10/2021 > LVEF 30-35%      Arrhythmia / AAD VT  Mexiletine >  initiated after VT with ATP x2 06/21/23  Risk Assessment/Calculations:    CHA2DS2-VASc Score = 6   This indicates a 9.7% annual risk of stroke. The patient's score is based upon: CHF History: 1 HTN History: 1 Diabetes History: 1 Stroke History: 0 Vascular Disease History: 1 Age Score: 2 Gender Score: 0         Physical Exam:   VS:  BP (!) 152/70   Pulse 66   Ht 5\' 7"  (1.702 m)   Wt 191 lb (86.6 kg)   SpO2 97%   BMI 29.91 kg/m    Wt Readings from Last 3 Encounters:  07/21/23 191 lb (86.6 kg)  07/02/23 192 lb 12.8 oz (87.5 kg)  10/13/22 188 lb (85.3 kg)     GEN: Well nourished, well developed in no acute distress NECK: No JVD; No carotid bruits CARDIAC: Irregularly irregular rate and rhythm, no murmurs, rubs, gallops RESPIRATORY:  Clear to auscultation without rales, wheezing or rhonchi  ABDOMEN: Soft, non-tender, non-distended EXTREMITIES:  No edema; No deformity   ASSESSMENT AND PLAN:    VT, Chronic Systolic Dysfunction s/p Abbott single chamber ICD  -euvolemic on exam -Stable on an appropriate medical regimen -no driving for 6 mo from 19/14/78   -GDMT per HF Team  -device check deferred today, recent check 07/02/23  AFL / Coarse AF  CHA2DS2-VASc 6 New on EKG 07/02/23. Has single chamber device but episode on 07/2022 and 06/29/23 that was irregular on EGM that  could have been AF/AFL -continue Toprol 25mg  at bedtime  -anticoagulation for stroke prophylaxis  -remains in coarse AF vs AFL, DCCV planned for 07/26/23.  Orders placed, under sign and held.  -repeat BMP, Mg+ > had recent CBC  Secondary Hypercoagulable State  -continue Eliquis 5mg  BID, dose reviewed and appropriate by age/wt  Hypertension  -slightly elevated in clinic today, monitor for now   Informed Consent   Shared Decision Making/Informed Consent The risks (stroke, cardiac arrhythmias rarely resulting in the need for a temporary or permanent pacemaker, skin irritation or burns and complications  associated with conscious sedation including aspiration, arrhythmia, respiratory failure and death), benefits (restoration of normal sinus rhythm) and alternatives of a direct current cardioversion were explained in detail to Victor Estes and he agrees to proceed.        Disposition:   Follow up for planned DCCV and with EP APP in 4 weeks   Signed, Canary Brim, NP-C, AGACNP-BC [facility] - Electrophysiology  07/21/2023, 2:55 PM

## 2023-07-21 NOTE — Patient Instructions (Addendum)
Medication Instructions:   Your physician recommends that you continue on your current medications as directed. Please refer to the Current Medication list given to you today.   *If you need a refill on your cardiac medications before your next appointment, please call your pharmacy*   Lab Work:   PLEASE GO DOWN STAIRS  [facility]  FIRST FLOOR  [address] :  BMET AND MAG TODAY    If you have labs (blood work) drawn today and your tests are completely normal, you will receive your results only by: MyChart Message (if you have MyChart) OR A paper copy in the mail If you have any lab test that is abnormal or we need to change your treatment, we will call you to review the results.   Testing/Procedures:  Your physician has recommended that you have a Cardioversion (DCCV). Electrical Cardioversion uses a jolt of electricity to your heart either through paddles or wired patches attached to your chest. This is a controlled, usually prescheduled, procedure. Defibrillation is done under light anesthesia in the hospital, and you usually go home the day of the procedure. This is done to get your heart back into a normal rhythm. You are not awake for the procedure. Please see the instruction sheet given to you today.\V      Follow-Up: At [facility], you and your health needs are our priority.  As part of our continuing mission to provide you with exceptional heart care, we have created designated Provider Care Teams.  These Care Teams include your primary Cardiologist (physician) and Advanced Practice Providers (APPs -  Physician Assistants and Nurse Practitioners) who all work together to provide you with the care you need, when you need it.  We recommend signing up for the patient portal called "MyChart".  Sign up information is provided on this After Visit Summary.  MyChart is used to connect with patients for Virtual Visits (Telemedicine).  Patients are able to view lab/test results,  encounter notes, upcoming appointments, etc.  Non-urgent messages can be sent to your provider as well.   To learn more about what you can do with MyChart, go to [email or web address].    Your next appointment:    4-5 WEEKS  ( CONTACT  CASSIE HALL/ ANGELINE HAMMER FOR EP SCHEDULING ISSUES )   Provider:  Canary Brim NP

## 2023-07-21 NOTE — Progress Notes (Signed)
Electrophysiology Office Note:   Date:  07/21/2023  ID:  Victor Estes, DOB 27-Aug-1945, MRN [id number]  Primary Cardiologist: Nanetta Batty, MD Primary Heart Failure: None Electrophysiologist: Sherryl Manges, MD       History of Present Illness:   Victor Estes is a 78 y.o. male with h/o VT s/p ICD, CAD s/p CABG, carotid disease on R, sCHF seen today for routine electrophysiology followup.   Seen in clinic 07/01/22 after recent ATP x1 > brief termination > VT, ATP delivered a second time converting rhythm.  He was started on mexiletine after remote alert.  No further episodes.  In clinic, he was also identified to have AFL.  He was started on [facility] and Toprol at that time.   Since last being seen in our clinic the patient reports doing well since last visit. He has not missed doses of Eliquis. He reports he is tolerating the Toprol. Has been largely asymptomatic from AFL/AF.   He denies chest pain, palpitations, dyspnea, PND, orthopnea, nausea, vomiting, dizziness, syncope, edema, weight gain, or early satiety.   Review of systems complete and found to be negative unless listed in HPI.   EP Information / Studies Reviewed:    EKG is ordered today. Personal review as below.  EKG Interpretation Date/Time:  Wednesday July 21 2023 13:57:29 EST Ventricular Rate:  66 PR Interval:    QRS Duration:  108 QT Interval:  408 QTC Calculation: 427 R Axis:   5  Text Interpretation: Coarse Atrial fibrillation Confirmed by Canary Brim ([id number]) on 07/21/2023 2:52:02 PM   ICD Interrogation-  reviewed in detail today,  See PACEART report.  Device History: Abbott Single Chamber ICD implanted 04/29/15 for VT History of appropriate therapy: Yes History of AAD therapy: Yes; currently on mexiletine     Studies:  LHC 08/2020 > 3V CAD, LIMA-LAD-D1 patent, SVG-OM2 patent, SVG-RCA 100% Myoview 08/2020 > inferior fixed defect ECHO 10/2021 > LVEF 30-35%      Arrhythmia / AAD VT  Mexiletine >  initiated after VT with ATP x2 06/21/23  Risk Assessment/Calculations:    CHA2DS2-VASc Score = 6   This indicates a 9.7% annual risk of stroke. The patient's score is based upon: CHF History: 1 HTN History: 1 Diabetes History: 1 Stroke History: 0 Vascular Disease History: 1 Age Score: 2 Gender Score: 0         Physical Exam:   VS:  BP (!) 152/70   Pulse 66   Ht 5\' 7"  (1.702 m)   Wt 191 lb (86.6 kg)   SpO2 97%   BMI 29.91 kg/m    Wt Readings from Last 3 Encounters:  07/21/23 191 lb (86.6 kg)  07/02/23 192 lb 12.8 oz (87.5 kg)  10/13/22 188 lb (85.3 kg)     GEN: Well nourished, well developed in no acute distress NECK: No JVD; No carotid bruits CARDIAC: Irregularly irregular rate and rhythm, no murmurs, rubs, gallops RESPIRATORY:  Clear to auscultation without rales, wheezing or rhonchi  ABDOMEN: Soft, non-tender, non-distended EXTREMITIES:  No edema; No deformity   ASSESSMENT AND PLAN:    VT, Chronic Systolic Dysfunction s/p Abbott single chamber ICD  -euvolemic on exam -Stable on an appropriate medical regimen -no driving for 6 mo from 08/65/78   -GDMT per HF Team  -device check deferred today, recent check 07/02/23  AFL / Coarse AF  CHA2DS2-VASc 6 New on EKG 07/02/23. Has single chamber device but episode on 07/2022 and 06/29/23 that was irregular on EGM that  could have been AF/AFL -continue Toprol 25mg  at bedtime  -anticoagulation for stroke prophylaxis  -remains in coarse AF vs AFL, DCCV planned for 07/26/23.  Orders placed, under sign and held.  -repeat BMP, Mg+ > had recent CBC  Secondary Hypercoagulable State  -continue Eliquis 5mg  BID, dose reviewed and appropriate by age/wt  Hypertension  -slightly elevated in clinic today, monitor for now   Informed Consent   Shared Decision Making/Informed Consent The risks (stroke, cardiac arrhythmias rarely resulting in the need for a temporary or permanent pacemaker, skin irritation or burns and complications  associated with conscious sedation including aspiration, arrhythmia, respiratory failure and death), benefits (restoration of normal sinus rhythm) and alternatives of a direct current cardioversion were explained in detail to Mr. Kassab and he agrees to proceed.        Disposition:   Follow up for planned DCCV and with EP APP in 4 weeks   Signed, Canary Brim, NP-C, AGACNP-BC [facility] - Electrophysiology  07/21/2023, 2:55 PM

## 2023-07-22 LAB — BASIC METABOLIC PANEL
BUN/Creatinine Ratio: 12 (ref 10–24)
BUN: 14 mg/dL (ref 8–27)
CO2: 21 mmol/L (ref 20–29)
Calcium: 9.7 mg/dL (ref 8.6–10.2)
Chloride: 102 mmol/L (ref 96–106)
Creatinine, Ser: 1.19 mg/dL (ref 0.76–1.27)
Glucose: 163 mg/dL — ABNORMAL HIGH (ref 70–99)
Potassium: 4.1 mmol/L (ref 3.5–5.2)
Sodium: 141 mmol/L (ref 134–144)
eGFR: 63 mL/min/{1.73_m2} (ref >59)

## 2023-07-22 LAB — MAGNESIUM: Magnesium: 2 mg/dL (ref 1.6–2.3)

## 2023-07-23 NOTE — Progress Notes (Signed)
Spoke to patient and instructed them to come at 0730  and to be NPO after 0000.  Medications reviewed.    Confirmed that patient will have a ride home and someone to stay with them for 24 hours after the procedure.

## 2023-07-26 ENCOUNTER — Encounter (HOSPITAL_COMMUNITY): Payer: Self-pay | Admitting: Cardiology

## 2023-07-26 ENCOUNTER — Ambulatory Visit (HOSPITAL_COMMUNITY)
Admission: RE | Admit: 2023-07-26 | Discharge: 2023-07-26 | Disposition: A | Discharge status: Home / Self Care | Payer: Medicare Other | Attending: Cardiology | Admitting: Cardiology

## 2023-07-26 ENCOUNTER — Ambulatory Visit (HOSPITAL_COMMUNITY): Payer: Medicare Other | Admitting: Anesthesiology

## 2023-07-26 ENCOUNTER — Other Ambulatory Visit: Payer: Self-pay

## 2023-07-26 ENCOUNTER — Encounter (HOSPITAL_COMMUNITY)
Admission: RE | Disposition: A | Discharge status: Home / Self Care | Payer: Self-pay | Source: Home / Self Care | Attending: Cardiology

## 2023-07-26 DIAGNOSIS — I509 Heart failure, unspecified: Secondary | ICD-10-CM | POA: Diagnosis not present

## 2023-07-26 DIAGNOSIS — Z95810 Presence of automatic (implantable) cardiac defibrillator: Secondary | ICD-10-CM | POA: Insufficient documentation

## 2023-07-26 DIAGNOSIS — I4891 Unspecified atrial fibrillation: Secondary | ICD-10-CM

## 2023-07-26 DIAGNOSIS — I252 Old myocardial infarction: Secondary | ICD-10-CM | POA: Insufficient documentation

## 2023-07-26 DIAGNOSIS — Z7901 Long term (current) use of anticoagulants: Secondary | ICD-10-CM | POA: Diagnosis not present

## 2023-07-26 DIAGNOSIS — D6869 Other thrombophilia: Secondary | ICD-10-CM | POA: Insufficient documentation

## 2023-07-26 DIAGNOSIS — I6521 Occlusion and stenosis of right carotid artery: Secondary | ICD-10-CM | POA: Diagnosis not present

## 2023-07-26 DIAGNOSIS — Z79899 Other long term (current) drug therapy: Secondary | ICD-10-CM | POA: Insufficient documentation

## 2023-07-26 DIAGNOSIS — Z951 Presence of aortocoronary bypass graft: Secondary | ICD-10-CM | POA: Diagnosis not present

## 2023-07-26 DIAGNOSIS — I2510 Atherosclerotic heart disease of native coronary artery without angina pectoris: Secondary | ICD-10-CM | POA: Insufficient documentation

## 2023-07-26 DIAGNOSIS — I5022 Chronic systolic (congestive) heart failure: Secondary | ICD-10-CM | POA: Diagnosis not present

## 2023-07-26 DIAGNOSIS — I472 Ventricular tachycardia, unspecified: Secondary | ICD-10-CM | POA: Diagnosis not present

## 2023-07-26 DIAGNOSIS — I110 Hypertensive heart disease with heart failure: Secondary | ICD-10-CM | POA: Diagnosis not present

## 2023-07-26 DIAGNOSIS — Z87891 Personal history of nicotine dependence: Secondary | ICD-10-CM | POA: Diagnosis not present

## 2023-07-26 DIAGNOSIS — K219 Gastro-esophageal reflux disease without esophagitis: Secondary | ICD-10-CM | POA: Diagnosis not present

## 2023-07-26 DIAGNOSIS — E1151 Type 2 diabetes mellitus with diabetic peripheral angiopathy without gangrene: Secondary | ICD-10-CM | POA: Insufficient documentation

## 2023-07-26 DIAGNOSIS — I480 Paroxysmal atrial fibrillation: Secondary | ICD-10-CM

## 2023-07-26 DIAGNOSIS — I4892 Unspecified atrial flutter: Secondary | ICD-10-CM

## 2023-07-26 HISTORY — PX: CARDIOVERSION: EP1203

## 2023-07-26 LAB — GLUCOSE, CAPILLARY: Glucose-Capillary: 146 mg/dL — ABNORMAL HIGH (ref 70–99)

## 2023-07-26 SURGERY — CARDIOVERSION (CATH LAB)
Anesthesia: General

## 2023-07-26 MED ORDER — PROPOFOL 10 MG/ML IV BOLUS
INTRAVENOUS | Status: DC | PRN
  Administered 2023-07-26: 60 mg via INTRAVENOUS

## 2023-07-26 MED ORDER — SODIUM CHLORIDE 0.9 % IV SOLN: INTRAVENOUS | Status: DC

## 2023-07-26 SURGICAL SUPPLY — 1 items: PAD DEFIB RADIO PHYSIO CONN (PAD) ×1 IMPLANT

## 2023-07-26 NOTE — Anesthesia Postprocedure Evaluation (Signed)
Anesthesia Post Note  Patient: Victor Estes  Procedure(s) Performed: CARDIOVERSION     Patient location during evaluation: PACU Anesthesia Type: General Level of consciousness: awake and alert Pain management: pain level controlled Vital Signs Assessment: post-procedure vital signs reviewed and stable Respiratory status: spontaneous breathing, nonlabored ventilation and respiratory function stable Cardiovascular status: blood pressure returned to baseline and stable Postop Assessment: no apparent nausea or vomiting Anesthetic complications: no   No notable events documented.  Last Vitals:  Vitals:   07/26/23 0905 07/26/23 0910  BP: (!) 115/56 109/65  Pulse: 65 62  Resp: 19 (!) 21  Temp:    SpO2: 91% 94%    Last Pain:  Vitals:   07/26/23 0910  TempSrc:   PainSc: 0-No pain                 Collene Schlichter

## 2023-07-26 NOTE — Anesthesia Preprocedure Evaluation (Addendum)
Anesthesia Evaluation  Patient identified by MRN, date of birth, ID band Patient awake    Reviewed: Allergy & Precautions, NPO status , Patient's Chart, lab work & pertinent test results, reviewed documented beta blocker date and time   Airway Mallampati: II  TM Distance: >3 FB Neck ROM: Full    Dental  (+) Teeth Intact, Dental Advisory Given, Caps   Pulmonary former smoker   Pulmonary exam normal breath sounds clear to auscultation       Cardiovascular hypertension, Pt. on medications and Pt. on home beta blockers + CAD, + Past MI, + CABG, + Peripheral Vascular Disease and +CHF  + dysrhythmias Atrial Fibrillation and Ventricular Tachycardia + Cardiac Defibrillator  Rhythm:Irregular Rate:Abnormal  Echo 10/30/21:  1. Left ventricular ejection fraction, by estimation, is 30 to 35%. The  left ventricle has moderately decreased function. The left ventricle  demonstrates regional wall motion abnormalities (see scoring  diagram/findings for description). The left  ventricular internal cavity size was moderately to severely dilated. Left  ventricular diastolic parameters are consistent with Grade II diastolic  dysfunction (pseudonormalization). There is akinesis of the left  ventricular, basal-mid inferoseptal wall  and anteroseptal wall. There is akinesis of the left ventricular, entire  inferior wall.   2. Right ventricular systolic function is normal. The right ventricular  size is normal. There is normal pulmonary artery systolic pressure. The  estimated right ventricular systolic pressure is 21.1 mmHg.   3. Left atrial size was moderately dilated.   4. The mitral valve is normal in structure. Mild to moderate mitral valve  regurgitation. No evidence of mitral stenosis.   5. The aortic valve is tricuspid. Aortic valve regurgitation is mild.  Aortic valve sclerosis/calcification is present, without any evidence of  aortic stenosis.  Aortic regurgitation PHT measures 854 msec.   6. The inferior vena cava is normal in size with greater than 50%  respiratory variability, suggesting right atrial pressure of 3 mmHg.     Neuro/Psych negative neurological ROS     GI/Hepatic Neg liver ROS,GERD  Medicated,,  Endo/Other  diabetes, Type 2    Renal/GU negative Renal ROS     Musculoskeletal  (+) Arthritis ,    Abdominal   Peds  Hematology  (+) Blood dyscrasia (Eliquis)   Anesthesia Other Findings Day of surgery medications reviewed with the patient.  Reproductive/Obstetrics                             Anesthesia Physical Anesthesia Plan  ASA: 4  Anesthesia Plan: General   Post-op Pain Management:    Induction: Intravenous  PONV Risk Score and Plan: 2 and TIVA  Airway Management Planned: Natural Airway and Mask  Additional Equipment:   Intra-op Plan:   Post-operative Plan:   Informed Consent: I have reviewed the patients History and Physical, chart, labs and discussed the procedure including the risks, benefits and alternatives for the proposed anesthesia with the patient or authorized representative who has indicated his/her understanding and acceptance.     Dental advisory given  Plan Discussed with: CRNA  Anesthesia Plan Comments:         Anesthesia Quick Evaluation

## 2023-07-26 NOTE — Transfer of Care (Signed)
Immediate Anesthesia Transfer of Care Note  Patient: Victor Estes  Procedure(s) Performed: CARDIOVERSION  Patient Location: Cath Lab  Anesthesia Type:MAC  Level of Consciousness: awake, alert , and oriented  Airway & Oxygen Therapy: Patient Spontanous Breathing and Patient connected to nasal cannula oxygen  Post-op Assessment: Report given to RN and Post -op Vital signs reviewed and stable  Post vital signs: Reviewed and stable  Last Vitals:  Vitals Value Taken Time  BP 129/67   Temp 98   Pulse 60   Resp 16   SpO2 94     Last Pain: There were no vitals filed for this visit.       Complications: No notable events documented.

## 2023-07-26 NOTE — CV Procedure (Signed)
   Electrical Cardioversion Procedure Note Victor Estes [id number] August 03, 1945  Procedure: Electrical Cardioversion Indications:  Atrial Fibrillation  Time Out: Verified patient identification, verified procedure,medications/allergies/relevent history reviewed, required imaging and test results available.  Performed  Procedure Details  The patient signed informed consent.   The patient was NPO past midnight. Has had therapeutic anticoagulation with Eliquis greater than 3 weeks. The patient denies any interruption of anticoagulation.  Anesthesia was administered by Dr. Desmond Lope.  Adequate airway was maintained throughout and vital followed per protocol.  He was cardioverted x 1 with 200J of biphasic synchronized energy.  He converted to NSR.  There were no apparent complications.  The patient tolerated the procedure well and had normal neuro status and respiratory status post procedure with vitals stable as recorded elsewhere.     IMPRESSION:  Successful cardioversion of atrial fibrillation   Follow up:  We will arrange follow up with primary cardiologist .  He will continue on current medical therapy.  The patient advised to continue anticoagulation.  Victor Estes 07/26/2023, 9:10 AM

## 2023-07-26 NOTE — Interval H&P Note (Signed)
History and Physical Interval Note:  07/26/2023 8:34 AM  Victor Estes  has presented today for surgery, with the diagnosis of AFIB.  The various methods of treatment have been discussed with the patient and family. After consideration of risks, benefits and other options for treatment, the patient has consented to  Procedure(s): CARDIOVERSION (N/A) as a surgical intervention.  The patient's history has been reviewed, patient examined, no change in status, stable for surgery.  I have reviewed the patient's chart and labs.  Questions were answered to the patient's satisfaction.     Rasmus Preusser

## 2023-07-27 ENCOUNTER — Encounter (HOSPITAL_COMMUNITY): Payer: Self-pay | Admitting: Cardiology

## 2023-07-28 ENCOUNTER — Encounter: Payer: Self-pay | Admitting: Internal Medicine

## 2023-08-02 NOTE — Addendum Note (Signed)
Addended by: Geralyn Flash D on: 08/02/2023 10:53 AM   Modules accepted: Orders

## 2023-08-02 NOTE — Progress Notes (Signed)
 Remote ICD transmission.

## 2023-08-27 NOTE — Progress Notes (Signed)
 Electrophysiology Office Note:   Date:  08/30/2023  ID:  Victor Estes, DOB 07/24/1945, MRN [id number]  Primary Cardiologist: Nanetta Batty, MD Primary Heart Failure: None Electrophysiologist: Sherryl Manges, MD       History of Present Illness:   Victor Estes is a 78 y.o. male with h/o VT s/p ICD, AFL/AF, CAD s/p CABG, carotid disease on R, sCHF seen today for routine electrophysiology followup.   Seen in clinic 07/01/22 after recent ATP x1 > brief termination > VT, ATP delivered a second time converting rhythm.  He was started on mexiletine after remote alert.  No further episodes.  In clinic, he was also identified to have AFL.  He was started on [facility] and Toprol at that time.   Since last being seen in our clinic the patient reports doing very well. He just went to play 60 holes of golf recently.  Rode the golf cart and did well.  He denies chest pain, palpitations, dyspnea, PND, orthopnea, nausea, vomiting, dizziness, syncope, edema, weight gain, or early satiety.   Review of systems complete and found to be negative unless listed in HPI.   EP Information / Studies Reviewed:    EKG is not ordered today. EKG from 07/26/23 reviewed which showed SB with 1st degree AVB      ICD Interrogation-  reviewed in detail today,  See PACEART report.  Device History: Abbott Single Chamber ICD implanted 04/29/15 for VT History of appropriate therapy: Yes History of AAD therapy: Yes; currently on mexiletine     Studies:  LHC 08/2020 > 3V CAD, LIMA-LAD-D1 patent, SVG-OM2 patent, SVG-RCA 100% Myoview 08/2020 > inferior fixed defect ECHO 10/2021 > LVEF 30-35%      Arrhythmia / AAD VT  Mexiletine > initiated after VT with ATP x2 06/21/23 DCCV 07/26/23 > 200j x1 and cardioverted    Risk Assessment/Calculations:    CHA2DS2-VASc Score = 6   This indicates a 9.7% annual risk of stroke. The patient's score is based upon: CHF History: 1 HTN History: 1 Diabetes History: 1 Stroke History:  0 Vascular Disease History: 1 Age Score: 2 Gender Score: 0             Physical Exam:   VS:  BP 126/80   Pulse (!) 58   Ht 5\' 8"  (1.727 m)   Wt 196 lb 12.8 oz (89.3 kg)   SpO2 95%   BMI 29.92 kg/m    Wt Readings from Last 3 Encounters:  08/30/23 196 lb 12.8 oz (89.3 kg)  07/26/23 190 lb (86.2 kg)  07/21/23 191 lb (86.6 kg)     GEN: Well nourished, well developed in no acute distress NECK: No JVD; No carotid bruits CARDIAC: Regular rate and rhythm, no murmurs, rubs, gallops RESPIRATORY:  Clear to auscultation without rales, wheezing or rhonchi  ABDOMEN: Soft, non-tender, non-distended EXTREMITIES:  No edema; No deformity   ASSESSMENT AND PLAN:    Chronic Systolic Dysfunction, VT s/p Abbott single chamber ICD  -euvolemic today -Stable on an appropriate medical regimen -Normal ICD function -See Pace Art report -No changes today  AFL / Coarse AF  CHA2DS2-VASc 6 -Toprol 25 mg at bedtime  -anticoagulation for stroke prophylaxis  -post DCCV on 07/26/23   -remains in NSR by auscultation, no irregular EGM's on device  Secondary Hypercoagulable State  -continue Eliquis 5mg  BID, dose reviewed and appropriate by age / wt   Hypertension  -well controlled on current regimen      Disposition:   Follow  up with EP APP  4 months to ensure no new NSVT or AF by EGM    Signed, Canary Brim, NP-C, AGACNP-BC [facility] - Electrophysiology  08/30/2023, 11:04 AM

## 2023-08-30 ENCOUNTER — Ambulatory Visit: Payer: Medicare Other | Attending: Pulmonary Disease | Admitting: Pulmonary Disease

## 2023-08-30 ENCOUNTER — Encounter: Payer: Self-pay | Admitting: Pulmonary Disease

## 2023-08-30 VITALS — BP 126/80 | HR 58 | Ht 68.0 in | Wt 196.8 lb

## 2023-08-30 DIAGNOSIS — I480 Paroxysmal atrial fibrillation: Secondary | ICD-10-CM | POA: Diagnosis not present

## 2023-08-30 DIAGNOSIS — I10 Essential (primary) hypertension: Secondary | ICD-10-CM

## 2023-08-30 DIAGNOSIS — I472 Ventricular tachycardia, unspecified: Secondary | ICD-10-CM

## 2023-08-30 DIAGNOSIS — Z95810 Presence of automatic (implantable) cardiac defibrillator: Secondary | ICD-10-CM

## 2023-08-30 DIAGNOSIS — I255 Ischemic cardiomyopathy: Secondary | ICD-10-CM

## 2023-08-30 DIAGNOSIS — I4892 Unspecified atrial flutter: Secondary | ICD-10-CM | POA: Diagnosis not present

## 2023-08-30 LAB — CUP PACEART INCLINIC DEVICE CHECK
Battery Remaining Longevity: 37 mo
Brady Statistic RV Percent Paced: 14 %
Date Time Interrogation Session: 20250303133021
HighPow Impedance: 61.875
Implantable Lead Connection Status: 753985
Implantable Lead Implant Date: 20161031
Implantable Lead Location: 753860
Implantable Lead Model: 181
Implantable Lead Serial Number: 333140
Implantable Pulse Generator Implant Date: 20161031
Lead Channel Impedance Value: 325 Ohm
Lead Channel Pacing Threshold Amplitude: 0.75 V
Lead Channel Pacing Threshold Amplitude: 0.75 V
Lead Channel Pacing Threshold Pulse Width: 0.5 ms
Lead Channel Pacing Threshold Pulse Width: 0.5 ms
Lead Channel Sensing Intrinsic Amplitude: 6.7 mV
Lead Channel Setting Pacing Amplitude: 2.5 V
Lead Channel Setting Pacing Pulse Width: 0.5 ms
Lead Channel Setting Sensing Sensitivity: 0.5 mV
Pulse Gen Serial Number: 7308468

## 2023-08-30 NOTE — Patient Instructions (Addendum)
 Medication Instructions:  Your physician recommends that you continue on your current medications as directed. Please refer to the Current Medication list given to you today.  *If you need a refill on your cardiac medications before your next appointment, please call your pharmacy*  Lab Work: None ordered.  You may go to any [facility] Location for your lab work:  [facility] - [address] ([facility]) - [address] [phone] [address]  [place] - [address]   [place]  - [address]   [place] - [address] - [address] [employer]  - [address] [place] - [address] ([facility]   If you have labs (blood work) drawn today and your tests are completely normal, you will receive your results only by: [employer] (if you have MyChart)  If you have any lab test that is abnormal or we need to change your treatment, we will call you or send a MyChart message to review the results.  Testing/Procedures: None ordered.  Follow-Up: At [facility], you and your health needs are our priority.  As part of our continuing mission to provide you with exceptional heart care, we have created designated Provider Care Teams.  These Care Teams include your primary Cardiologist (physician) and Advanced Practice Providers (APPs -  Physician Assistants and Nurse Practitioners) who all work together to provide you with the care you need, when you need it.  Your next appointment:   4 months  The format for your next appointment:   In Person  Provider:    Earnest Rosier, NP  Note: Remote monitoring is used to monitor your Pacemaker/ ICD from home. This monitoring reduces the number of office visits required to check your device to one time per year. It allows [place] to keep an eye on the functioning of your device to ensure it is working properly.            Valet  parking services will be available as well.

## 2023-09-20 ENCOUNTER — Ambulatory Visit (INDEPENDENT_AMBULATORY_CARE_PROVIDER_SITE_OTHER): Payer: Medicare Other

## 2023-09-20 DIAGNOSIS — I472 Ventricular tachycardia, unspecified: Secondary | ICD-10-CM | POA: Diagnosis not present

## 2023-09-20 LAB — CUP PACEART REMOTE DEVICE CHECK
Battery Remaining Longevity: 36 mo
Battery Remaining Percentage: 36 %
Battery Voltage: 2.86 V
Brady Statistic RV Percent Paced: 8.1 %
Date Time Interrogation Session: 20250324033915
HighPow Impedance: 66 Ohm
HighPow Impedance: 66 Ohm
Implantable Lead Connection Status: 753985
Implantable Lead Implant Date: 20161031
Implantable Lead Location: 753860
Implantable Lead Model: 181
Implantable Lead Serial Number: 333140
Implantable Pulse Generator Implant Date: 20161031
Lead Channel Impedance Value: 310 Ohm
Lead Channel Pacing Threshold Amplitude: 0.75 V
Lead Channel Pacing Threshold Pulse Width: 0.5 ms
Lead Channel Sensing Intrinsic Amplitude: 6.4 mV
Lead Channel Setting Pacing Amplitude: 2.5 V
Lead Channel Setting Pacing Pulse Width: 0.5 ms
Lead Channel Setting Sensing Sensitivity: 0.5 mV
Pulse Gen Serial Number: 7308468

## 2023-10-16 ENCOUNTER — Encounter: Payer: Self-pay | Admitting: Internal Medicine

## 2023-11-01 ENCOUNTER — Other Ambulatory Visit: Payer: Self-pay

## 2023-11-01 DIAGNOSIS — I6521 Occlusion and stenosis of right carotid artery: Secondary | ICD-10-CM

## 2023-11-08 NOTE — Progress Notes (Unsigned)
 Patient name: Victor Estes MRN: [id number] DOB: 11/10/1945 Sex: male  REASON FOR CONSULT: 1 year follow-up for carotid surveillance after prior TCAR  HPI: Victor Estes is a 78 y.o. male, with history of hypertension, hyperlipidemia, coronary artery disease status post four-vessel CABG in 1995 that presents for 1 year follow-up for carotid surveillance.  Patient has been followed by Dr. Katheryne Pane for many years and right TCAR was on 11/28/2021 for high-grade asymptomatic stenosis.  Patient did well after surgery with no issues.    No new neurologic events since last follow-up.  Taking aspirin , plavix , statin.  No new concerns today.  Past Medical History:  Diagnosis Date   AICD (automatic cardioverter/defibrillator) present    Arthritis    Cancer (HCC)    Carotid artery disease (HCC)    Diabetes mellitus without complication (HCC)    GERD (gastroesophageal reflux disease)    Hyperlipemia    Hypertension    Dr Katheryne Pane   Myocardial infarct [facility])    CABG-1995- LIMA-D1-LAD, SVG-OM, SVG-RCA    Ventricular tachycardia, sustained (HCC) 04/27/2015    Past Surgical History:  Procedure Laterality Date   BACK SURGERY     CARDIAC CATHETERIZATION N/A 04/27/2015   Severe native dz, LIMA-LAD & SVG-OM OK, SVG-RCA chronically occluded. Left Heart Cath and Coronary Angiography;  Surgeon: Arnoldo Lapping, MD;  Location: [facility] INVASIVE CV LAB;  Service: Cardiovascular;  Laterality: N/A;   CARDIOVERSION N/A 07/26/2023   Procedure: CARDIOVERSION;  Surgeon: Jerryl Morin, DO;  Location: [facility] INVASIVE CV LAB;  Service: Cardiovascular;  Laterality: N/A;   CHOLECYSTECTOMY     COLON SURGERY     CORONARY ARTERY BYPASS GRAFT  1995    LIMA-D1-LAD, SVG-OM, SVG-RCA, Dr Elvan Hamel   ELBOW SURGERY     EP IMPLANTABLE DEVICE N/A 04/29/2015   Procedure: ICD Implant;  Surgeon: Verona Goodwill, MD;  Location: [facility] INVASIVE CV LAB;  Service: Cardiovascular;  Laterality: N/A; St Jude  ICD, serial number  [id number].   LUMBAR  LAMINECTOMY/DECOMPRESSION MICRODISCECTOMY  09/09/2011   Procedure: LUMBAR LAMINECTOMY/DECOMPRESSION MICRODISCECTOMY 1 LEVEL;  Surgeon: Isadora Mar, MD;  Location: [facility] NEURO ORS;  Service: Neurosurgery;  Laterality: Right;  Right Lumbar Five-Sacral One Hemilaminectomy    RIGHT/LEFT HEART CATH AND CORONARY/GRAFT ANGIOGRAPHY N/A 09/24/2020   Procedure: RIGHT/LEFT HEART CATH AND CORONARY/GRAFT ANGIOGRAPHY;  Surgeon: Millicent Ally, MD;  Location: [facility] INVASIVE CV LAB;  Service: Cardiovascular;  Laterality: N/A;   TOTAL KNEE ARTHROPLASTY Left 07/26/2013   Procedure: TOTAL KNEE ARTHROPLASTY;  Surgeon: Ferd Householder, MD;  Location: [facility] OR;  Service: Orthopedics;  Laterality: Left;   TRANSCAROTID ARTERY REVASCULARIZATION  Right 11/28/2021   Procedure: Transcarotid Artery Revascularization;  Surgeon: Young Hensen, MD;  Location: [facility] OR;  Service: Vascular;  Laterality: Right;   ULTRASOUND GUIDANCE FOR VASCULAR ACCESS Left 11/28/2021   Procedure: ULTRASOUND GUIDANCE FOR VASCULAR ACCESS;  Surgeon: Young Hensen, MD;  Location: [facility] OR;  Service: Vascular;  Laterality: Left;    Family History  Problem Relation Age of Onset   Heart failure Mother    Hypertension Mother    Hypertension Other     SOCIAL HISTORY: Social History   Socioeconomic History   Marital status: Married    Spouse name: Licensed conveyancer   Number of children: 2   Years of education: Not on file   Highest education level: High school graduate  Occupational History   Occupation: Owns a Teaching laboratory technician  Tobacco Use   Smoking status: Former  Current packs/day: 0.00    Types: Cigarettes    Quit date: 04/06/1983    Years since quitting: 40.6   Smokeless tobacco: Never  Vaping Use   Vaping status: Never Used  Substance and Sexual Activity   Alcohol  use: No    Alcohol /week: 0.0 standard drinks of alcohol    Drug use: No   Sexual activity: Not on file  Other Topics Concern   Not on file  Social History Narrative    Not on file   Social Drivers of Health   Financial Resource Strain: Low Risk  (09/25/2020)   Overall Financial Resource Strain (CARDIA)    Difficulty of Paying Living Expenses: Not hard at all  Food Insecurity: No Food Insecurity (09/25/2020)   Hunger Vital Sign    Worried About Running Out of Food in the Last Year: Never true    Ran Out of Food in the Last Year: Never true  Transportation Needs: No Transportation Needs (09/25/2020)   PRAPARE - Administrator, Civil Service (Medical): No    Lack of Transportation (Non-Medical): No  Physical Activity: Insufficiently Active (09/25/2020)   Exercise Vital Sign    Days of Exercise per Week: 3 days    Minutes of Exercise per Session: 30 min  Stress: Not on file  Social Connections: Not on file  Intimate Partner Violence: Not on file    Allergies  Allergen Reactions   Clarithromycin Swelling    Current Outpatient Medications  Medication Sig Dispense Refill   acetaminophen  (TYLENOL ) 325 MG tablet Take 650 mg by mouth every 6 (six) hours as needed for mild pain, moderate pain, fever or headache.     Alum Hydroxide-Mag Carbonate (ACID GONE ANTACID PO) Take 1 tablet by mouth 3 (three) times daily as needed (acid reflux).     amLODipine  (NORVASC ) 5 MG tablet Take 5 mg by mouth every morning.     apixaban  (ELIQUIS ) 5 MG TABS tablet Take 1 tablet (5 mg total) by mouth 2 (two) times daily. 180 tablet 3   aspirin  81 MG EC tablet Take 1 tablet (81 mg total) by mouth daily with breakfast. 30 tablet 11   atorvastatin  (LIPITOR ) 80 MG tablet Take 1 tablet (80 mg total) by mouth at bedtime. 90 tablet 3   carboxymethylcellulose (REFRESH PLUS) 0.5 % SOLN 1 drop 3 (three) times daily as needed (dry eyes).     ENTRESTO  49-51 MG TAKE 1 TABLET BY MOUTH TWICE DAILY 60 tablet 11   metFORMIN  (GLUCOPHAGE -XR) 500 MG 24 hr tablet Take 500 mg by mouth 2 (two) times daily with a meal.     methocarbamol  (ROBAXIN ) 500 MG tablet Take 1 tablet (500 mg  total) by mouth at bedtime. 30 tablet 11   metoprolol  succinate (TOPROL  XL) 25 MG 24 hr tablet Take 1 tablet (25 mg total) by mouth at bedtime. 90 tablet 3   mexiletine (MEXITIL) 250 MG capsule Take 1 capsule (250 mg total) by mouth 2 (two) times daily. 60 capsule 3   nitroGLYCERIN  (NITROSTAT ) 0.4 MG SL tablet Place 0.4 mg under the tongue every 5 (five) minutes as needed for chest pain.     pantoprazole  (PROTONIX ) 40 MG tablet Take 1 tablet (40 mg total) by mouth 2 (two) times daily. 180 tablet 3   tamsulosin  (FLOMAX ) 0.4 MG CAPS capsule Take 0.4 mg by mouth 2 (two) times daily.     traZODone (DESYREL) 100 MG tablet Take 100 mg by mouth at bedtime.     No  current facility-administered medications for this visit.    REVIEW OF SYSTEMS:  [X]  denotes positive finding, [ ]  denotes negative finding Cardiac  Comments:  Chest pain or chest pressure:    Shortness of breath upon exertion:    Short of breath when lying flat:    Irregular heart rhythm:        Vascular    Pain in calf, thigh, or hip brought on by ambulation:    Pain in feet at night that wakes you up from your sleep:     Blood clot in your veins:    Leg swelling:         Pulmonary    Oxygen at home:    Productive cough:     Wheezing:         Neurologic    Sudden weakness in arms or legs:     Sudden numbness in arms or legs:     Sudden onset of difficulty speaking or slurred speech:    Temporary loss of vision in one eye:     Problems with dizziness:         Gastrointestinal    Blood in stool:     Vomited blood:         Genitourinary    Burning when urinating:     Blood in urine:        Psychiatric    Major depression:         Hematologic    Bleeding problems:    Problems with blood clotting too easily:        Skin    Rashes or ulcers:        Constitutional    Fever or chills:      PHYSICAL EXAM: There were no vitals filed for this visit.   GENERAL: The patient is a well-nourished male, in no acute  distress. The vital signs are documented above. CARDIAC: There is a regular rate and rhythm.  VASCULAR:  Right neck incision healed NEUROLOGIC: No focal weakness or paresthesias are detected.  CN II-XII grossly intact. SKIN: There are no ulcers or rashes noted. PSYCHIATRIC: The patient has a normal affect.  DATA:   Carotid duplex today shows widely patent right ICA stent with minimal 1 to 39% stenosis in the left ICA  Assessment/Plan:  78 y.o. male, with history of hypertension, hyperlipidemia, coronary artery disease status post four-vessel CABG in 1995 that presents for 1 year follow-up for carotid surveillance.  Patient had a right TCAR was on 11/28/2021 for high-grade asymptomatic stenosis.    Young Hensen, MD [facility] Office: [phone]

## 2023-11-09 ENCOUNTER — Ambulatory Visit (HOSPITAL_COMMUNITY)
Admission: RE | Admit: 2023-11-09 | Discharge: 2023-11-09 | Disposition: A | Discharge status: Home / Self Care | Payer: Medicare Other | Source: Ambulatory Visit | Attending: Surgery | Admitting: Surgery

## 2023-11-09 ENCOUNTER — Encounter: Payer: Self-pay | Admitting: Vascular Surgery

## 2023-11-09 ENCOUNTER — Ambulatory Visit: Payer: Medicare Other | Attending: Vascular Surgery | Admitting: Vascular Surgery

## 2023-11-09 VITALS — BP 118/71 | HR 57 | Temp 97.6°F | Resp 20 | Ht 68.0 in | Wt 186.0 lb

## 2023-11-09 DIAGNOSIS — I6521 Occlusion and stenosis of right carotid artery: Secondary | ICD-10-CM

## 2023-11-09 NOTE — Progress Notes (Signed)
 Remote ICD transmission.

## 2023-12-20 ENCOUNTER — Ambulatory Visit (INDEPENDENT_AMBULATORY_CARE_PROVIDER_SITE_OTHER): Payer: Medicare Other

## 2023-12-20 DIAGNOSIS — I255 Ischemic cardiomyopathy: Secondary | ICD-10-CM

## 2023-12-20 DIAGNOSIS — I472 Ventricular tachycardia, unspecified: Secondary | ICD-10-CM | POA: Diagnosis not present

## 2023-12-21 LAB — CUP PACEART REMOTE DEVICE CHECK
Battery Remaining Longevity: 32 mo
Battery Remaining Percentage: 32 %
Battery Voltage: 2.84 V
Brady Statistic RV Percent Paced: 5.7 %
Date Time Interrogation Session: 20250623020027
HighPow Impedance: 75 Ohm
HighPow Impedance: 75 Ohm
Implantable Lead Connection Status: 753985
Implantable Lead Implant Date: 20161031
Implantable Lead Location: 753860
Implantable Lead Model: 181
Implantable Lead Serial Number: 333140
Implantable Pulse Generator Implant Date: 20161031
Lead Channel Impedance Value: 340 Ohm
Lead Channel Pacing Threshold Amplitude: 0.75 V
Lead Channel Pacing Threshold Pulse Width: 0.5 ms
Lead Channel Sensing Intrinsic Amplitude: 8.1 mV
Lead Channel Setting Pacing Amplitude: 2.5 V
Lead Channel Setting Pacing Pulse Width: 0.5 ms
Lead Channel Setting Sensing Sensitivity: 0.5 mV
Pulse Gen Serial Number: 7308468

## 2024-01-26 NOTE — Addendum Note (Signed)
 Addended by: TAWNI DRILLING D on: 01/26/2024 11:52 AM   Modules accepted: Orders

## 2024-01-26 NOTE — Progress Notes (Signed)
 Remote ICD transmission.

## 2024-03-20 ENCOUNTER — Ambulatory Visit (INDEPENDENT_AMBULATORY_CARE_PROVIDER_SITE_OTHER): Payer: Medicare Other

## 2024-03-20 DIAGNOSIS — I472 Ventricular tachycardia, unspecified: Secondary | ICD-10-CM | POA: Diagnosis not present

## 2024-03-20 LAB — CUP PACEART REMOTE DEVICE CHECK
Battery Remaining Longevity: 30 mo
Battery Remaining Percentage: 30 %
Battery Voltage: 2.83 V
Brady Statistic RV Percent Paced: 5.1 %
Date Time Interrogation Session: 20250922044536
HighPow Impedance: 72 Ohm
HighPow Impedance: 72 Ohm
Implantable Lead Connection Status: 753985
Implantable Lead Implant Date: 20161031
Implantable Lead Location: 753860
Implantable Lead Model: 181
Implantable Lead Serial Number: 333140
Implantable Pulse Generator Implant Date: 20161031
Lead Channel Impedance Value: 310 Ohm
Lead Channel Pacing Threshold Amplitude: 0.75 V
Lead Channel Pacing Threshold Pulse Width: 0.5 ms
Lead Channel Sensing Intrinsic Amplitude: 6.4 mV
Lead Channel Setting Pacing Amplitude: 2.5 V
Lead Channel Setting Pacing Pulse Width: 0.5 ms
Lead Channel Setting Sensing Sensitivity: 0.5 mV
Pulse Gen Serial Number: 7308468

## 2024-03-21 NOTE — Progress Notes (Signed)
Remote ICD Transmission.

## 2024-03-23 ENCOUNTER — Other Ambulatory Visit: Payer: Self-pay | Admitting: Neurological Surgery

## 2024-03-23 DIAGNOSIS — M5416 Radiculopathy, lumbar region: Secondary | ICD-10-CM

## 2024-04-04 ENCOUNTER — Ambulatory Visit: Payer: Self-pay | Admitting: Cardiovascular Disease

## 2024-04-04 ENCOUNTER — Inpatient Hospital Stay: Admission: RE | Admit: 2024-04-04 | Source: Ambulatory Visit

## 2024-04-11 ENCOUNTER — Ambulatory Visit
Admission: RE | Admit: 2024-04-11 | Discharge: 2024-04-11 | Disposition: A | Source: Ambulatory Visit | Attending: Neurological Surgery | Admitting: Neurological Surgery

## 2024-04-11 DIAGNOSIS — M5416 Radiculopathy, lumbar region: Secondary | ICD-10-CM

## 2024-04-11 DIAGNOSIS — M48061 Spinal stenosis, lumbar region without neurogenic claudication: Secondary | ICD-10-CM | POA: Diagnosis not present

## 2024-04-11 DIAGNOSIS — M5116 Intervertebral disc disorders with radiculopathy, lumbar region: Secondary | ICD-10-CM | POA: Diagnosis not present

## 2024-04-11 DIAGNOSIS — M4726 Other spondylosis with radiculopathy, lumbar region: Secondary | ICD-10-CM | POA: Diagnosis not present

## 2024-05-05 NOTE — Progress Notes (Signed)
 Electrophysiology Office Note:   Date:  05/16/2024  ID:  Victor Estes, DOB 04/30/1946, MRN [id number]  Primary Cardiologist: Dorn Lesches, MD Primary Heart Failure: None Electrophysiologist: Eulas FORBES Furbish, MD       History of Present Illness:   Victor Estes is a 78 y.o. male with h/o VT s/p ICD, AFL/AF, CAD s/p CABG, carotid disease on R, sCHF  seen today for routine electrophysiology followup.   Since last being seen in our clinic the patient reports doing well overall. His wife just had a stroke and is recovering - she accompanies him at today's visit (residual peripheral vision issues). He reports he has not been as active due to her health issues and feels somewhat fatigued. States he remains very active and wants to continue to be so. He has been going to hockey games and walking in from the stadium is a small incline and he notes he will have mild shortness of breath but recovers quickly and never chest pain or pressure.  He reports no concerns with his device.  No issues with bleeding on Eliquis .   He denies chest pain, palpitations, PND, orthopnea, nausea, vomiting, dizziness, syncope, edema, weight gain, or early satiety.   Review of systems complete and found to be negative unless listed in HPI.   EP Information / Studies Reviewed:    EKG is not ordered today. EKG from 07/26/23 reviewed which showed SB with 1st degree AVB 58 bpm with occ PVC      ICD Interrogation-  reviewed in detail today,  See PACEART report.   Device History: Abbott Single Chamber ICD implanted 04/29/15 for VT History of appropriate therapy: Yes History of AAD therapy: Yes; currently on mexiletine     Arrhythmia / AAD / Pertinent EP Studies VT  Mexiletine > initiated after VT with ATP x2 06/21/23 DCCV 07/26/23 > 200j x1 and cardioverted   ATP 06/30/22  AFL 06/2022    Risk Assessment/Calculations:    CHA2DS2-VASc Score = 6   This indicates a 9.7% annual risk of stroke. The  patient's score is based upon: CHF History: 1 HTN History: 1 Diabetes History: 1 Stroke History: 0 Vascular Disease History: 1 Age Score: 2 Gender Score: 0             Physical Exam:   VS:  BP 130/70 (BP Location: Left Arm, Patient Position: Sitting, Cuff Size: Normal)   Pulse 91   Ht 5' 8 (1.727 m)   Wt 184 lb 3.2 oz (83.6 kg)   SpO2 97%   BMI 28.01 kg/m    Wt Readings from Last 3 Encounters:  05/16/24 184 lb 3.2 oz (83.6 kg)  11/09/23 186 lb (84.4 kg)  08/30/23 196 lb 12.8 oz (89.3 kg)     GEN: Well nourished, well developed in no acute distress NECK: No JVD; No carotid bruits CARDIAC: Regular rate and rhythm, no murmurs, rubs, gallops.  Regular VS on device, occ PVC's.  RESPIRATORY:  Clear to auscultation without rales, wheezing or rhonchi  ABDOMEN: Soft, non-tender, non-distended EXTREMITIES:  No edema; No deformity   ASSESSMENT AND PLAN:    Chronic Systolic Dysfunction due to ICM s/p Abbott single chamber ICD  VT LVEF 30-35% with GII DD in 10/2021  -euvolemic by exam / by device > CorVue stable on device  -SR 60's (VS 60's) on device with OCC PVC's -Stable on an appropriate medical regimen / GDMT per Dr. Lesches -Normal ICD function -See Pace Art report -No changes today  AFL / Coarse AF  CHA2DS2-VASc 6  -OAC for stroke prophylaxis  -no evidence for atrial arrhythmia on device  -Toprol  25 mg at bedtime  -no issues with bleeding on OAC   Secondary Hypercoagulable State  -continue Eliquis  5mg  BID, dose reviewed and appropriate by age / wt  -f/u BMP to review Cr with Eliquis   Hypertension  -well controlled on current regimen      Disposition:   Follow up with Dr. Nancey in 12 months   Signed, Victor Barrack, NP-C, AGACNP-BC [facility] - Electrophysiology  05/16/2024, 8:32 AM

## 2024-05-10 ENCOUNTER — Ambulatory Visit: Payer: Self-pay | Admitting: Cardiovascular Disease

## 2024-05-16 ENCOUNTER — Ambulatory Visit: Attending: Pulmonary Disease | Admitting: Pulmonary Disease

## 2024-05-16 ENCOUNTER — Encounter: Payer: Self-pay | Admitting: Pulmonary Disease

## 2024-05-16 VITALS — BP 130/70 | HR 91 | Ht 68.0 in | Wt 184.2 lb

## 2024-05-16 DIAGNOSIS — I480 Paroxysmal atrial fibrillation: Secondary | ICD-10-CM | POA: Diagnosis not present

## 2024-05-16 DIAGNOSIS — D6869 Other thrombophilia: Secondary | ICD-10-CM | POA: Diagnosis not present

## 2024-05-16 DIAGNOSIS — I4892 Unspecified atrial flutter: Secondary | ICD-10-CM | POA: Diagnosis not present

## 2024-05-16 DIAGNOSIS — I255 Ischemic cardiomyopathy: Secondary | ICD-10-CM | POA: Insufficient documentation

## 2024-05-16 DIAGNOSIS — I472 Ventricular tachycardia, unspecified: Secondary | ICD-10-CM | POA: Insufficient documentation

## 2024-05-16 DIAGNOSIS — Z95810 Presence of automatic (implantable) cardiac defibrillator: Secondary | ICD-10-CM | POA: Diagnosis not present

## 2024-05-16 LAB — BASIC METABOLIC PANEL WITH GFR
BUN/Creatinine Ratio: 14 (ref 10–24)
BUN: 12 mg/dL (ref 8–27)
CO2: 22 mmol/L (ref 20–29)
Calcium: 9.6 mg/dL (ref 8.6–10.2)
Chloride: 104 mmol/L (ref 96–106)
Creatinine, Ser: 0.87 mg/dL (ref 0.76–1.27)
Glucose: 121 mg/dL — ABNORMAL HIGH (ref 70–99)
Potassium: 4.3 mmol/L (ref 3.5–5.2)
Sodium: 141 mmol/L (ref 134–144)
eGFR: 88 mL/min/1.73 (ref >59)

## 2024-05-16 LAB — CUP PACEART INCLINIC DEVICE CHECK
Battery Remaining Longevity: 27 mo
Brady Statistic RV Percent Paced: 6.2 %
Date Time Interrogation Session: 20251118084657
HighPow Impedance: 59.625
Implantable Lead Connection Status: 753985
Implantable Lead Implant Date: 20161031
Implantable Lead Location: 753860
Implantable Lead Model: 181
Implantable Lead Serial Number: 333140
Implantable Pulse Generator Implant Date: 20161031
Lead Channel Impedance Value: 312.5 Ohm
Lead Channel Pacing Threshold Amplitude: 1 V
Lead Channel Pacing Threshold Amplitude: 1 V
Lead Channel Pacing Threshold Pulse Width: 0.5 ms
Lead Channel Pacing Threshold Pulse Width: 0.5 ms
Lead Channel Sensing Intrinsic Amplitude: 7 mV
Lead Channel Setting Pacing Amplitude: 2.5 V
Lead Channel Setting Pacing Pulse Width: 0.5 ms
Lead Channel Setting Sensing Sensitivity: 0.5 mV
Pulse Gen Serial Number: 7308468

## 2024-05-16 NOTE — Patient Instructions (Signed)
 Medication Instructions:  No medications changes today   *If you need a refill on your cardiac medications before your next appointment, please call your pharmacy*  Lab Work: BMP to review your kidney numbers for your medication monitoring  If you have labs (blood work) drawn today and your tests are completely normal, you will receive your results only by: MyChart Message (if you have MyChart) OR A paper copy in the mail If you have any lab test that is abnormal or we need to change your treatment, we will call you to review the results.  Testing/Procedures: No testing/procedures were scheduled today  Follow-Up: At [facility], you and your health needs are our priority.  As part of our continuing mission to provide you with exceptional heart care, our providers are all part of one team.  This team includes your primary Cardiologist (physician) and Advanced Practice Providers or APPs (Physician Assistants and Nurse Practitioners) who all work together to provide you with the care you need, when you need it.  Your next appointment:   12 month(s)  Provider:   You may see Eulas FORBES Furbish, MD or one of the following Advanced Practice Providers on your designated Care Team:   Daphne Barrack, NP    We recommend signing up for the patient portal called MyChart.  Sign up information is provided on this After Visit Summary.  MyChart is used to connect with patients for Virtual Visits (Telemedicine).  Patients are able to view lab/test results, encounter notes, upcoming appointments, etc.  Non-urgent messages can be sent to your provider as well.   To learn more about what you can do with MyChart, go to [email or web address].

## 2024-05-17 ENCOUNTER — Ambulatory Visit: Payer: Self-pay | Admitting: Pulmonary Disease

## 2024-05-18 ENCOUNTER — Ambulatory Visit: Attending: Cardiovascular Disease | Admitting: Cardiovascular Disease

## 2024-05-18 ENCOUNTER — Encounter: Payer: Self-pay | Admitting: Cardiovascular Disease

## 2024-05-18 VITALS — BP 130/60 | HR 66 | Ht 68.0 in | Wt 184.0 lb

## 2024-05-18 DIAGNOSIS — I10 Essential (primary) hypertension: Secondary | ICD-10-CM | POA: Insufficient documentation

## 2024-05-18 DIAGNOSIS — I4892 Unspecified atrial flutter: Secondary | ICD-10-CM | POA: Diagnosis not present

## 2024-05-18 DIAGNOSIS — I2510 Atherosclerotic heart disease of native coronary artery without angina pectoris: Secondary | ICD-10-CM | POA: Diagnosis not present

## 2024-05-18 DIAGNOSIS — I472 Ventricular tachycardia, unspecified: Secondary | ICD-10-CM | POA: Diagnosis not present

## 2024-05-18 DIAGNOSIS — E785 Hyperlipidemia, unspecified: Secondary | ICD-10-CM | POA: Diagnosis not present

## 2024-05-18 DIAGNOSIS — I480 Paroxysmal atrial fibrillation: Secondary | ICD-10-CM | POA: Diagnosis not present

## 2024-05-18 DIAGNOSIS — I6521 Occlusion and stenosis of right carotid artery: Secondary | ICD-10-CM | POA: Insufficient documentation

## 2024-05-18 DIAGNOSIS — I255 Ischemic cardiomyopathy: Secondary | ICD-10-CM | POA: Diagnosis not present

## 2024-05-18 NOTE — Assessment & Plan Note (Signed)
 History of hyperlipidemia on high-dose atorvastatin  with lipid profile performed 11/29/2021 revealing total cholesterol 105, LDL 54 and HDL 37.  Will recheck a lipid and liver profile.

## 2024-05-18 NOTE — Assessment & Plan Note (Signed)
 History of CAD status post myocardial infarction in 1993 with CABG x 4 in 1995.  He underwent right left heart cath by Dr. Burnard March 2022 revealing unchanged anatomy.  He denies chest pain but does complain of some dyspnea on exertion.

## 2024-05-18 NOTE — Assessment & Plan Note (Signed)
 History of ischemic cardiomyopathy with an EF by recent 2D echo of 30 to 35% performed 5//23.  He did have mild to moderate MR as well.  We will recheck a 2D echocardiogram.

## 2024-05-18 NOTE — Assessment & Plan Note (Signed)
 History of ICD implantation because of VT with placed by Dr. Fernande.  He has not had a discharge.  He will need a new electrophysiologist as since Dr. Fernande has retired.

## 2024-05-18 NOTE — Patient Instructions (Signed)
 Medication Instructions:  Your physician recommends that you continue on your current medications as directed. Please refer to the Current Medication list given to you today.  *If you need a refill on your cardiac medications before your next appointment, please call your pharmacy*  Lab Work: Today- Lipid/liver panel  If you have labs (blood work) drawn today and your tests are completely normal, you will receive your results only by: MyChart Message (if you have MyChart) OR A paper copy in the mail If you have any lab test that is abnormal or we need to change your treatment, we will call you to review the results.  Testing/Procedures: Your physician has requested that you have an echocardiogram. Echocardiography is a painless test that uses sound waves to create images of your heart. It provides your doctor with information about the size and shape of your heart and how well your heart's chambers and valves are working. This procedure takes approximately one hour. There are no restrictions for this procedure. Please do NOT wear cologne, perfume, aftershave, or lotions (deodorant is allowed). Please arrive 15 minutes prior to your appointment time.  Please note: We ask at that you not bring children with you during ultrasound (echo/ vascular) testing. Due to room size and safety concerns, children are not allowed in the ultrasound rooms during exams. Our front office staff cannot provide observation of children in our lobby area while testing is being conducted. An adult accompanying a patient to their appointment will only be allowed in the ultrasound room at the discretion of the ultrasound technician under special circumstances. We apologize for any inconvenience.   Follow-Up: At [facility], you and your health needs are our priority.  As part of our continuing mission to provide you with exceptional heart care, our providers are all part of one team.  This team includes your  primary Cardiologist (physician) and Advanced Practice Providers or APPs (Physician Assistants and Nurse Practitioners) who all work together to provide you with the care you need, when you need it.  Your next appointment:   12 month(s)  Provider:   Dorn Lesches, MD    We recommend signing up for the patient portal called MyChart.  Sign up information is provided on this After Visit Summary.  MyChart is used to connect with patients for Virtual Visits (Telemedicine).  Patients are able to view lab/test results, encounter notes, upcoming appointments, etc.  Non-urgent messages can be sent to your provider as well.   To learn more about what you can do with MyChart, go to [email or web address].

## 2024-05-18 NOTE — Progress Notes (Signed)
 05/18/2024 Victor Estes   1945/08/06  [id number]  [facility]: Victor Victor Estes, [name], Victor Estes  HPI:  Victor Estes is a 78 y.o.    mildly overweight, married Caucasian male, father of 2, grandfather to 5 grandchildren who I have been taking care of for the last 30 years. I last saw him in the office 01/06/2022.SABRAHe is accompanied by his wife Victor Estes today who is also a patient of mine..  He had an MI back in 1993 and subsequent coronary artery bypass grafting x4 in 1995. His risk factors are remarkable for remote tobacco abuse, hypertension, hyperlipidemia, and family history. His last Myoview  performed 2 year ago showed inferior scar without ischemia, unchanged from prior studies. A 2D echo revealed an EF of 45% to 50% with inferior and septal hypokinesia. Carotid Dopplers showed moderate right ICA stenosis unchanged from prior studies. He is neurologically asymptomatic. Recent lab work [facility] [place] 03/31/13 revealed a total cholesterol of 141, LDL 61 HDL 63. He denies chest pain or shortness of breath. He had  elective TKR replacement by Dr. Beverley 07/05/13. I obtained a Myoview  stress test 03/29/13 that showed impaired scar without ischemia. The epicardium slightly to 40% by quantitative gated SPECT. A 2-D echocardiogram performed 05/24/13 revealed an ejection fraction of 40-45%.. Since I saw him a year ago he's remained clinically stable. He gets a rare episode of dizziness on the [place]. 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 He was admitted to [facility] on 04/27/15 with sustained ventricular tachycardia requiring cardioversion. He had a cardiac catheterization by Dr. Wonda revealing stable anatomy without a culprit lesion and therefore his VT was thought to be scar related. Based on this, Dr. Fernande placed a ICD for secondary prevention as follow this up as an  outpatient since.   He did develop COVID-19 back in January and has been slowly recovering since that time.  He had a persistent cough and was treated by Dr. Guarino.  Because of dyspnea on exertion I performed 2D echocardiography on 09/13/2020 revealing ejection fraction of 40 to 45% without significant valvular abnormalities.  A Myoview  stress test performed at the same time showed inferior scar without ischemia unchanged from prior studies.  After I saw him on 09/17/2020 he was admitted to the hospital 5 days later with volume overload systolic heart failure and was diuresed.  His EF at that time had declined to 20 to 25% from 40 to 45% a week or 2 prior to that for unclear reasons.  He underwent right left heart cath by Dr. Burnard who found unchanged anatomy.  He has been seen as an outpatient by our Pharm.D.'s who have been titrating his heart failure medications and currently he is on guideline directed optimal medical therapy and is clinically improved.  I referred him to Dr. Medford Gaskins because of asymptomatic high-grade right ICA stenosis.  He underwent successful and uncomplicated TCAR on 11/28/2021 was discharged home the following day.  His most recent carotid Dopplers performed 11/09/2023 revealed a widely patent endarterectomy site.  Since I saw him a year and a half ago he is done well until January of this year when he had sudden onset of shortness of breath and was found to be in A-fib.  He underwent DC cardioversion by Dr. Sheena  07/26/2023.SABRA  Recently he has felt more rundown.  He always has dyspnea  but denies chest pain.  His EKG today shows A-fib with a slow ventricular response.  He is on Eliquis .  He is active and recently played 60 holes of golf.  He continues to work as a nutritional therapist at age 76.   Current Meds  Medication Sig   acetaminophen  (TYLENOL ) 325 MG tablet Take 650 mg by mouth every 6 (Estes) hours as needed for mild pain, moderate pain, fever or headache.   Alum Hydroxide-Mag  Carbonate (ACID GONE ANTACID PO) Take 1 tablet by mouth 3 (three) times daily as needed (acid reflux).   amLODipine  (NORVASC ) 5 MG tablet Take 5 mg by mouth every morning.   apixaban  (ELIQUIS ) 5 MG TABS tablet Take 1 tablet (5 mg total) by mouth 2 (two) times daily.   aspirin  81 MG EC tablet Take 1 tablet (81 mg total) by mouth daily with breakfast.   atorvastatin  (LIPITOR ) 80 MG tablet Take 1 tablet (80 mg total) by mouth at bedtime.   carboxymethylcellulose (REFRESH PLUS) 0.5 % SOLN 1 drop 3 (three) times daily as needed (dry eyes).   ENTRESTO  49-51 MG TAKE 1 TABLET BY MOUTH TWICE DAILY   metFORMIN  (GLUCOPHAGE -XR) 500 MG 24 hr tablet Take 500 mg by mouth 2 (two) times daily with a meal.   methocarbamol  (ROBAXIN ) 500 MG tablet Take 1 tablet (500 mg total) by mouth at bedtime. (Patient taking differently: Take 500 mg by mouth daily as needed for muscle spasms.)   metoprolol  succinate (TOPROL  XL) 25 MG 24 hr tablet Take 1 tablet (25 mg total) by mouth at bedtime.   mexiletine (MEXITIL) 250 MG capsule Take 1 capsule (250 mg total) by mouth 2 (two) times daily.   nitroGLYCERIN  (NITROSTAT ) 0.4 MG SL tablet Place 0.4 mg under the tongue every 5 (five) minutes as needed for chest pain.   pantoprazole  (PROTONIX ) 40 MG tablet Take 1 tablet (40 mg total) by mouth 2 (two) times daily.   tamsulosin  (FLOMAX ) 0.4 MG CAPS capsule Take 0.4 mg by mouth 2 (two) times daily. (Patient taking differently: Take 0.4 mg by mouth 2 (two) times daily as needed.)   traZODone (DESYREL) 100 MG tablet Take 100 mg by mouth at bedtime.     Allergies  Allergen Reactions   Clarithromycin Swelling    Social History   Socioeconomic History   Marital status: Married    Spouse name: Licensed Conveyancer   Number of children: 2   Years of education: Not on file   Highest education level: High school graduate  Occupational History   Occupation: Owns a teaching laboratory technician  Tobacco Use   Smoking status: Former    Current  packs/day: 0.00    Types: Cigarettes    Quit date: 04/06/1983    Years since quitting: 41.1   Smokeless tobacco: Never  Vaping Use   Vaping status: Never Used  Substance and Sexual Activity   Alcohol  use: No    Alcohol /week: 0.0 standard drinks of alcohol    Drug use: No   Sexual activity: Not on file  Other Topics Concern   Not on file  Social History Narrative   Not on file   Social Drivers of Health   Financial Resource Strain: Low Risk  (09/25/2020)   Overall Financial Resource Strain (CARDIA)    Difficulty of Paying Living Expenses: Not hard at all  Food Insecurity: No Food Insecurity (09/25/2020)   Hunger Vital Sign    Worried About Running Out of Food in the Last Year: Never true  Ran Out of Food in the Last Year: Never true  Transportation Needs: No Transportation Needs (09/25/2020)   PRAPARE - Administrator, Civil Service (Medical): No    Lack of Transportation (Non-Medical): No  Physical Activity: Insufficiently Active (09/25/2020)   Exercise Vital Sign    Days of Exercise per Week: 3 days    Minutes of Exercise per Session: 30 min  Stress: Not on file  Social Connections: Not on file  Intimate Partner Violence: Not on file     Review of Systems: General: negative for chills, fever, night sweats or weight changes.  Cardiovascular: negative for chest pain, dyspnea on exertion, edema, orthopnea, palpitations, paroxysmal nocturnal dyspnea or shortness of breath Dermatological: negative for rash Respiratory: negative for cough or wheezing Urologic: negative for hematuria Abdominal: negative for nausea, vomiting, diarrhea, bright red blood per rectum, melena, or hematemesis Neurologic: negative for visual changes, syncope, or dizziness All other systems reviewed and are otherwise negative except as noted above.    Blood pressure 130/60, pulse 66, height 5' 8 (1.727 m), weight 184 lb (83.5 kg), SpO2 97%.  General appearance: alert and no  distress Neck: no adenopathy, no carotid bruit, no JVD, supple, symmetrical, trachea midline, and thyroid not enlarged, symmetric, no tenderness/mass/nodules Lungs: clear to auscultation bilaterally Heart: irregularly irregular rhythm Extremities: extremities normal, atraumatic, no cyanosis or edema Pulses: 2+ and symmetric Skin: Skin color, texture, turgor normal. No rashes or lesions Neurologic: Grossly normal  EKG EKG Interpretation Date/Time:  Thursday May 18 2024 10:39:41 EST Ventricular Rate:  49 PR Interval:    QRS Duration:  104 QT Interval:  440 QTC Calculation: 397 R Axis:   7  Text Interpretation: Atrial fibrillation with slow ventricular response with occasional ventricular-paced complexes Inferior infarct , age undetermined When compared with ECG of 26-Jul-2023 08:47, Electronic ventricular pacemaker has replaced Sinus rhythm Confirmed by Court Carrier [phone]) on 05/18/2024 10:50:25 AM    ASSESSMENT AND PLAN:   CAD (coronary artery disease) History of CAD status post myocardial infarction in 1993 with CABG x 4 in 1995.  He underwent right left heart cath by Dr. Burnard March 2022 revealing unchanged anatomy.  He denies chest pain but does complain of some dyspnea on exertion.  Hypertension History of essential hypertension blood pressure measured today 130/60.  He is on metoprolol , amlodipine  and Entresto .  Hyperlipidemia LDL goal <70 History of hyperlipidemia on high-dose atorvastatin  with lipid profile performed 11/29/2021 revealing total cholesterol 105, LDL 54 and HDL 37.  Will recheck a lipid and liver profile.  Carotid artery disease History of carotid artery disease status post TCAR performed by Dr. Medford Gaskins at my request for high-grade right ICA stenosis 11/28/2021 which he follows by duplex ultrasound on annual basis.  Recent carotid Doppler performed 11/08/2021 revealed his endarterectomy site to be widely patent.  Ventricular tachycardia, sustained  (HCC) History of ICD implantation because of VT with placed by Dr. Fernande.  He has not had a discharge.  He will need a new electrophysiologist as since Dr. Fernande has retired.  Ischemic cardiomyopathy History of ischemic cardiomyopathy with an EF by recent 2D echo of 30 to 35% performed 5//23.  He did have mild to moderate MR as well.  We will recheck a 2D echocardiogram.  PAF (paroxysmal atrial fibrillation) (HCC) History of PAF status post cardioversion performed by Dr. Sheena  07/26/2023.  He remains on Eliquis .  He has noticed some more fatigue recently.  His EKG today shows A-fib with a  slow ventricular response of 49.  I am referring him back to the A-fib clinic for consideration of antiarrhythmic therapy plus or minus outpatient DC cardioversion.     Victor DOROTHA Lesches MD FACP,FACC,FAHA, [facility] 05/18/2024 10:53 AM

## 2024-05-18 NOTE — Progress Notes (Signed)
 Electrophysiology Office Note:   Date:  05/19/2024  ID:  Victor Estes, DOB February 20, 1946, MRN [id number]  Primary Cardiologist: Dorn Lesches, MD Primary Heart Failure: None Electrophysiologist: Eulas FORBES Furbish, MD       History of Present Illness:   Victor Estes is a 78 y.o. male with h/o  VT s/p ICD, AFL/AF, CAD s/p CABG, carotid disease on R, HFrEF seen today for acute visit due to AF.    Pt was seen in clinic on 11/18 and was VS in 60's with occ PVC on device.  Subsequently was seen by Cardiology and EKG showed AF. Referred back to EP for discussion regarding rhythm restoration.   Patient reports he has noted fatigue in the last month or so but thought it was related to his wife's illness.     He denies chest pain, palpitations, dyspnea, PND, orthopnea, nausea, vomiting, dizziness, syncope, edema, weight gain, or early satiety.   Review of systems complete and found to be negative unless listed in HPI.    EP Information / Studies Reviewed:    EKG is not ordered today. EKG from 05/18/24 reviewed which showed AF with slow ventricular response      ICD Interrogation-  full device check on 05/16/24, see prior report   Device History: Abbott Single Chamber ICD implanted 04/29/15 for VT History of appropriate therapy: Yes History of AAD therapy: Yes; currently on mexiletine   Arrhythmia / AAD / Pertinent EP Studies VT  Mexiletine > initiated after VT with ATP x2 06/21/23 DCCV 07/26/23 > 200j x1 and cardioverted   ATP 06/30/22  AFL 06/2022     Risk Assessment/Calculations:    CHA2DS2-VASc Score = 6   This indicates a 9.7% annual risk of stroke. The patient's score is based upon: CHF History: 1 HTN History: 1 Diabetes History: 1 Stroke History: 0 Vascular Disease History: 1 Age Score: 2 Gender Score: 0             Physical Exam:   VS:  BP 127/74 (BP Location: Right Arm, Patient Position: Sitting, Cuff Size: Normal)   Pulse 60   Ht 5' 8 (1.727 m)    Wt 188 lb (85.3 kg)   SpO2 95%   BMI 28.59 kg/m    Wt Readings from Last 3 Encounters:  05/19/24 188 lb (85.3 kg)  05/18/24 184 lb (83.5 kg)  05/16/24 184 lb 3.2 oz (83.6 kg)     GEN: Well nourished, well developed in no acute distress NECK: No JVD; No carotid bruits CARDIAC: Irregularly irregular rate and rhythm, no murmurs, rubs, gallops RESPIRATORY:  Clear to auscultation without rales, wheezing or rhonchi  ABDOMEN: Soft, non-tender, non-distended EXTREMITIES:  No edema; No deformity   ASSESSMENT AND PLAN:    Chronic Systolic Dysfunction s/p Abbott single chamber ICD  VT  -euvolemic on exam    -Stable on an appropriate medical regimen -device check deferred, recent full check  AFL / Coarse AF  CHA2DS2-VASc 6,  -continue OAC for stroke prophylaxis \ -discussed AAD & ablation   -AAD discussion > not a Ic candidate due to CAD, baseline QTc in SB 433 ms / CrCl 83 mL/min on 05/16/24 BMP would make him a potential candidate for Tikosyn pending medication review, also discussed amiodarone  > he elects to avoid 3 day admit for now due to his wife's recent stroke  -pt called back after visit and instructed to take 1/2 tablet of Toprol  (12.5 mg daily) > may need to stop all together,  discussed calling if he has new dizziness, fatigue or shortness of breath -prior DCCV off AAD, now recurrent AF > plan for amiodarone  load and repeat DCCV on AAD with follow up with Dr. Nancey after to discuss ablation   Informed Consent   Shared Decision Making/Informed Consent The risks (stroke, cardiac arrhythmias rarely resulting in the need for a temporary or permanent pacemaker, skin irritation or burns and complications associated with conscious sedation including aspiration, arrhythmia, respiratory failure and death), benefits (restoration of normal sinus rhythm) and alternatives of a direct current cardioversion were explained in detail to [name] and he agrees to proceed.      Secondary  Hypercoagulable State  -continue Eliquis  5mg  BID, dose reviewed and appropriate by age / wt   Hypertension  -well controlled on current regimen   CAD  -no anginal symptoms   Disposition:   Follow up with Dr. Nancey in 4 weeks after cardioversion   Signed, Daphne Barrack, NP-C, AGACNP-BC [facility] - Electrophysiology  05/19/2024, 2:55 PM

## 2024-05-18 NOTE — Assessment & Plan Note (Signed)
 History of essential hypertension blood pressure measured today 130/60.  He is on metoprolol , amlodipine  and Entresto .

## 2024-05-18 NOTE — Assessment & Plan Note (Signed)
 History of carotid artery disease status post TCAR performed by Dr. Medford Gaskins at my request for high-grade right ICA stenosis 11/28/2021 which he follows by duplex ultrasound on annual basis.  Recent carotid Doppler performed 11/08/2021 revealed his endarterectomy site to be widely patent.

## 2024-05-18 NOTE — Assessment & Plan Note (Signed)
 History of PAF status post cardioversion performed by Dr. Sheena  07/26/2023.  He remains on Eliquis .  He has noticed some more fatigue recently.  His EKG today shows A-fib with a slow ventricular response of 49.  I am referring him back to the A-fib clinic for consideration of antiarrhythmic therapy plus or minus outpatient DC cardioversion.

## 2024-05-19 ENCOUNTER — Ambulatory Visit: Payer: Self-pay | Admitting: Cardiovascular Disease

## 2024-05-19 ENCOUNTER — Encounter: Payer: Self-pay | Admitting: Pulmonary Disease

## 2024-05-19 ENCOUNTER — Ambulatory Visit: Attending: Pulmonary Disease | Admitting: Pulmonary Disease

## 2024-05-19 ENCOUNTER — Telehealth: Payer: Self-pay | Admitting: Cardiovascular Disease

## 2024-05-19 VITALS — BP 127/74 | HR 60 | Ht 68.0 in | Wt 188.0 lb

## 2024-05-19 DIAGNOSIS — I472 Ventricular tachycardia, unspecified: Secondary | ICD-10-CM | POA: Insufficient documentation

## 2024-05-19 DIAGNOSIS — I2510 Atherosclerotic heart disease of native coronary artery without angina pectoris: Secondary | ICD-10-CM

## 2024-05-19 DIAGNOSIS — E785 Hyperlipidemia, unspecified: Secondary | ICD-10-CM

## 2024-05-19 DIAGNOSIS — I255 Ischemic cardiomyopathy: Secondary | ICD-10-CM | POA: Diagnosis not present

## 2024-05-19 DIAGNOSIS — Z95810 Presence of automatic (implantable) cardiac defibrillator: Secondary | ICD-10-CM | POA: Diagnosis not present

## 2024-05-19 DIAGNOSIS — E079 Disorder of thyroid, unspecified: Secondary | ICD-10-CM | POA: Insufficient documentation

## 2024-05-19 DIAGNOSIS — D6869 Other thrombophilia: Secondary | ICD-10-CM | POA: Insufficient documentation

## 2024-05-19 LAB — HEPATIC FUNCTION PANEL
ALT: 13 IU/L (ref 0–44)
AST: 14 IU/L (ref 0–40)
Albumin: 4 g/dL (ref 3.8–4.8)
Alkaline Phosphatase: 81 IU/L (ref 47–123)
Bilirubin Total: 0.5 mg/dL (ref 0.0–1.2)
Bilirubin, Direct: 0.15 mg/dL (ref 0.00–0.40)
Total Protein: 6.3 g/dL (ref 6.0–8.5)

## 2024-05-19 LAB — LIPID PANEL
Chol/HDL Ratio: 4.3 ratio (ref 0.0–5.0)
Cholesterol, Total: 193 mg/dL (ref 100–199)
HDL: 45 mg/dL (ref >39)
LDL Chol Calc (NIH): 124 mg/dL — ABNORMAL HIGH (ref 0–99)
Triglycerides: 134 mg/dL (ref 0–149)
VLDL Cholesterol Cal: 24 mg/dL (ref 5–40)

## 2024-05-19 MED ORDER — METOPROLOL SUCCINATE ER 25 MG PO TB24: 12.5000 mg | ORAL_TABLET | Freq: Every day | ORAL | Status: AC

## 2024-05-19 MED ORDER — AMIODARONE HCL 200 MG PO TABS: ORAL_TABLET | ORAL | 3 refills | Status: DC

## 2024-05-19 NOTE — Telephone Encounter (Signed)
 Attempted to contact pt - no answer.  Left message to call back regarding question however phones are off until Monday.  Advised medication list is in his MyChart for his review and if he still has questions/concerns regarding his medication over the weekend to contact provider on call by calling the main phone number.

## 2024-05-19 NOTE — Patient Instructions (Addendum)
 Medication Instructions:  START Amiodarone  400mg  BID x7 days (2 tabs in am and 2 tabs in PM), then 200 mg daily (1 tab daily)  Reduce Toprol  to 12.5 mg daily   *If you need a refill on your cardiac medications before your next appointment, please call your pharmacy*  Lab Work: TSH, CBC, BMP in 2 weeks  If you have labs (blood work) drawn today and your tests are completely normal, you will receive your results only by: MyChart Message (if you have MyChart) OR A paper copy in the mail If you have any lab test that is abnormal or we need to change your treatment, we will call you to review the results.  Testing/Procedures: CARDIOVERSION scheduled for December 29th, 2025  Your physician has recommended that you have a Cardioversion (DCCV). Electrical Cardioversion uses a jolt of electricity to your heart either through paddles or wired patches attached to your chest. This is a controlled, usually prescheduled, procedure. Defibrillation is done under light anesthesia in the hospital, and you usually go home the day of the procedure. This is done to get your heart back into a normal rhythm. You are not awake for the procedure. Please see the instruction sheet given to you today.    Follow-Up: At [facility], you and your health needs are our priority.  As part of our continuing mission to provide you with exceptional heart care, our providers are all part of one team.  This team includes your primary Cardiologist (physician) and Advanced Practice Providers or APPs (Physician Assistants and Nurse Practitioners) who all work together to provide you with the care you need, when you need it.  Your next appointment:   1 month(s)  Provider:   Eulas Furbish, MD

## 2024-05-19 NOTE — Telephone Encounter (Signed)
 Patient has some questions about the medication change. Please advise

## 2024-06-12 DIAGNOSIS — I472 Ventricular tachycardia, unspecified: Secondary | ICD-10-CM | POA: Diagnosis not present

## 2024-06-12 DIAGNOSIS — I255 Ischemic cardiomyopathy: Secondary | ICD-10-CM | POA: Diagnosis not present

## 2024-06-12 DIAGNOSIS — E079 Disorder of thyroid, unspecified: Secondary | ICD-10-CM | POA: Diagnosis not present

## 2024-06-12 DIAGNOSIS — D6869 Other thrombophilia: Secondary | ICD-10-CM | POA: Diagnosis not present

## 2024-06-12 DIAGNOSIS — Z95810 Presence of automatic (implantable) cardiac defibrillator: Secondary | ICD-10-CM | POA: Diagnosis not present

## 2024-06-13 ENCOUNTER — Ambulatory Visit: Payer: Self-pay | Admitting: Pulmonary Disease

## 2024-06-13 LAB — BASIC METABOLIC PANEL WITH GFR
BUN/Creatinine Ratio: 19 (ref 10–24)
BUN: 18 mg/dL (ref 8–27)
CO2: 24 mmol/L (ref 20–29)
Calcium: 8.7 mg/dL (ref 8.6–10.2)
Chloride: 103 mmol/L (ref 96–106)
Creatinine, Ser: 0.97 mg/dL (ref 0.76–1.27)
Glucose: 114 mg/dL — ABNORMAL HIGH (ref 70–99)
Potassium: 4.2 mmol/L (ref 3.5–5.2)
Sodium: 138 mmol/L (ref 134–144)
eGFR: 80 mL/min/1.73 (ref >59)

## 2024-06-13 LAB — CBC
Hematocrit: 43.3 % (ref 37.5–51.0)
Hemoglobin: 15 g/dL (ref 13.0–17.7)
MCH: 33 pg (ref 26.6–33.0)
MCHC: 34.6 g/dL (ref 31.5–35.7)
MCV: 95 fL (ref 79–97)
Platelets: 231 x10E3/uL (ref 150–450)
RBC: 4.54 x10E6/uL (ref 4.14–5.80)
RDW: 12.4 % (ref 11.6–15.4)
WBC: 9.8 x10E3/uL (ref 3.4–10.8)

## 2024-06-13 LAB — TSH: TSH: 6.61 u[IU]/mL — ABNORMAL HIGH (ref 0.450–4.500)

## 2024-06-19 ENCOUNTER — Ambulatory Visit: Payer: Medicare Other

## 2024-06-19 DIAGNOSIS — I255 Ischemic cardiomyopathy: Secondary | ICD-10-CM

## 2024-06-20 LAB — CUP PACEART REMOTE DEVICE CHECK
Battery Remaining Longevity: 25 mo
Battery Remaining Percentage: 24 %
Battery Voltage: 2.8 V
Brady Statistic RV Percent Paced: 19 %
Date Time Interrogation Session: 20251222020017
HighPow Impedance: 71 Ohm
HighPow Impedance: 71 Ohm
Implantable Lead Connection Status: 753985
Implantable Lead Implant Date: 20161031
Implantable Lead Location: 753860
Implantable Lead Model: 181
Implantable Lead Serial Number: 333140
Implantable Pulse Generator Implant Date: 20161031
Lead Channel Impedance Value: 330 Ohm
Lead Channel Pacing Threshold Amplitude: 1 V
Lead Channel Pacing Threshold Pulse Width: 0.5 ms
Lead Channel Sensing Intrinsic Amplitude: 7 mV
Lead Channel Setting Pacing Amplitude: 2.5 V
Lead Channel Setting Pacing Pulse Width: 0.5 ms
Lead Channel Setting Sensing Sensitivity: 0.5 mV
Pulse Gen Serial Number: 7308468

## 2024-06-21 NOTE — Progress Notes (Signed)
 Remote ICD Transmission

## 2024-06-25 NOTE — Anesthesia Preprocedure Evaluation (Signed)
"                                    Anesthesia Evaluation    Reviewed: Allergy & Precautions, Patient's Chart, lab work & pertinent test results, reviewed documented beta blocker date and time   Airway        Dental   Pulmonary neg pulmonary ROS, former smoker          Cardiovascular hypertension, Pt. on home beta blockers and Pt. on medications + CAD, + Past MI and +CHF  + dysrhythmias (eliquis ) + Cardiac Defibrillator + Valvular Problems/Murmurs (mild/mod MR) MR  Rhythm:Irregular  TTE 2023 1. Left ventricular ejection fraction, by estimation, is 30 to 35%. The  left ventricle has moderately decreased function. The left ventricle  demonstrates regional wall motion abnormalities (see scoring  diagram/findings for description). The left  ventricular internal cavity size was moderately to severely dilated. Left  ventricular diastolic parameters are consistent with Grade II diastolic  dysfunction (pseudonormalization). There is akinesis of the left  ventricular, basal-mid inferoseptal wall  and anteroseptal wall. There is akinesis of the left ventricular, entire  inferior wall.   2. Right ventricular systolic function is normal. The right ventricular  size is normal. There is normal pulmonary artery systolic pressure. The  estimated right ventricular systolic pressure is 21.1 mmHg.   3. Left atrial size was moderately dilated.   4. The mitral valve is normal in structure. Mild to moderate mitral valve  regurgitation. No evidence of mitral stenosis.   5. The aortic valve is tricuspid. Aortic valve regurgitation is mild.  Aortic valve sclerosis/calcification is present, without any evidence of  aortic stenosis. Aortic regurgitation PHT measures 854 msec.   6. The inferior vena cava is normal in size with greater than 50%  respiratory variability, suggesting right atrial pressure of 3 mmHg.     Neuro/Psych negative neurological ROS  negative psych ROS    GI/Hepatic Neg liver ROS,GERD  ,,  Endo/Other  negative endocrine ROSdiabetes, Type 2, Oral Hypoglycemic Agents    Renal/GU negative Renal ROS  negative genitourinary   Musculoskeletal negative musculoskeletal ROS (+)    Abdominal   Peds  Hematology negative hematology ROS (+)   Anesthesia Other Findings   Reproductive/Obstetrics                              Anesthesia Physical Anesthesia Plan  ASA: 3  Anesthesia Plan: General   Post-op Pain Management:    Induction: Intravenous  PONV Risk Score and Plan: Propofol  infusion and Treatment may vary due to age or medical condition  Airway Management Planned: Natural Airway  Additional Equipment:   Intra-op Plan:   Post-operative Plan:   Informed Consent: I have reviewed the patients History and Physical, chart, labs and discussed the procedure including the risks, benefits and alternatives for the proposed anesthesia with the patient or authorized representative who has indicated his/her understanding and acceptance.     Dental advisory given  Plan Discussed with: CRNA  Anesthesia Plan Comments:         Anesthesia Quick Evaluation  "

## 2024-06-26 ENCOUNTER — Encounter (HOSPITAL_COMMUNITY): Payer: Self-pay | Admitting: Anesthesiology

## 2024-06-26 ENCOUNTER — Other Ambulatory Visit: Payer: Self-pay

## 2024-06-26 ENCOUNTER — Encounter (HOSPITAL_COMMUNITY)
Admission: RE | Disposition: A | Discharge status: Home / Self Care | Payer: Self-pay | Source: Home / Self Care | Attending: Cardiology

## 2024-06-26 ENCOUNTER — Telehealth: Payer: Self-pay | Admitting: Pulmonary Disease

## 2024-06-26 ENCOUNTER — Other Ambulatory Visit (HOSPITAL_COMMUNITY): Payer: Self-pay

## 2024-06-26 ENCOUNTER — Ambulatory Visit (HOSPITAL_COMMUNITY)
Admission: RE | Admit: 2024-06-26 | Discharge: 2024-06-26 | Disposition: A | Discharge status: Home / Self Care | Attending: Cardiology | Admitting: Cardiology

## 2024-06-26 ENCOUNTER — Other Ambulatory Visit: Payer: Self-pay | Admitting: Pulmonary Disease

## 2024-06-26 ENCOUNTER — Telehealth: Payer: Self-pay | Admitting: Cardiovascular Disease

## 2024-06-26 DIAGNOSIS — I4819 Other persistent atrial fibrillation: Secondary | ICD-10-CM

## 2024-06-26 DIAGNOSIS — I4891 Unspecified atrial fibrillation: Secondary | ICD-10-CM

## 2024-06-26 DIAGNOSIS — I480 Paroxysmal atrial fibrillation: Secondary | ICD-10-CM

## 2024-06-26 DIAGNOSIS — I5043 Acute on chronic combined systolic (congestive) and diastolic (congestive) heart failure: Secondary | ICD-10-CM

## 2024-06-26 LAB — GLUCOSE, CAPILLARY: Glucose-Capillary: 135 mg/dL — ABNORMAL HIGH (ref 70–99)

## 2024-06-26 SURGERY — CARDIOVERSION (CATH LAB)
Anesthesia: General

## 2024-06-26 MED ORDER — AMIODARONE HCL 200 MG PO TABS
200.0000 mg | ORAL_TABLET | Freq: Every day | ORAL | 3 refills | Status: AC
  Filled 2024-06-26: qty 90, 90d supply, fill #0

## 2024-06-26 MED ORDER — SACUBITRIL-VALSARTAN 49-51 MG PO TABS: 1.0000 | ORAL_TABLET | Freq: Two times a day (BID) | ORAL | 11 refills | Status: AC

## 2024-06-26 NOTE — Progress Notes (Signed)
 Patient presented for cardioversion today. On review with patient, patient's wife and his daughter, he is not currently taking apixaban . He is on aspirin  only. He receives his medications from the [facility] but had a home medication list, which does not have an anticoagulant on it. Discussed with patient that procedure cannot be performed when he is not on anticoagulant. He will be cancelled for today. I will forward this information on to Daphne Barrack, the ordering provider.  Shelda Bruckner, MD, PhD, [facility] [address] [place]  [facility]  [place]  [facility] at [facility] [address], [address] [place], [place] [address] [phone]

## 2024-06-26 NOTE — Telephone Encounter (Signed)
" °*  STAT* If patient is at the pharmacy, call can be transferred to refill team.   1. Which medications need to be refilled? (please list name of each medication and dose if known)   sacubitril -valsartan  (ENTRESTO ) 49-51 MG     2. Would you like to learn more about the convenience, safety, & potential cost savings by using the [facility]?    3. Are you open to using the [facility] (Type [facility].    4. Which pharmacy/location (including street and city if local pharmacy) is medication to be sent to? [id number] - [place], [place] - [address]       5. Do they need a 30 day or 90 day supply? 90  "

## 2024-06-26 NOTE — Telephone Encounter (Signed)
" °  Patient presented to [facility] 06/26/24 for DCCV.  However, it was discovered he was not actually taking his Eliquis  as prescribed (on his med list).  He thought the aspirin  was his anticoagulant.  Patient indicates understanding of Eliquis  and stroke prevention.  Will plan for reschedule for DCCV after 4 weeks OAC.  After talking to him on the phone, he reports he ran out of his amiodarone  as well (he has 3 refills on the medication from 05/19/24).  He states he has been off the amiodarone  for one week.  Discussed restarting at 200 mg daily > personally sent a second Rx for his amiodarone  to [facility] and discontinued the pre-existing Rx.      Reviewed he will need to have labs drawn the week before his cardioversion.  He will need to be at [facility] on 2/2 for planned procedure. He is scheduled for DCCV on 07/31/24 at 0830 and will need to arrive at 0730.     He indicates he gets his medications from the [facility] and some here. He is pending a visit with pharmacy for medication reconciliation / review on 07/17/24.       Informed Consent   Shared Decision Making/Informed Consent The risks (stroke, cardiac arrhythmias rarely resulting in the need for a temporary or permanent pacemaker, skin irritation or burns and complications associated with conscious sedation including aspiration, arrhythmia, respiratory failure and death), benefits (restoration of normal sinus rhythm) and alternatives of a direct current cardioversion were explained in detail to Mr. Rodenberg and he agrees to proceed.         CC: Alan Pho, Lucienne Solomons.   Daphne Barrack, NP-C, AGACNP-BC [facility] - Electrophysiology  06/26/2024, 1:25 PM         "

## 2024-06-27 ENCOUNTER — Other Ambulatory Visit (HOSPITAL_COMMUNITY): Payer: Self-pay

## 2024-06-28 ENCOUNTER — Ambulatory Visit (HOSPITAL_COMMUNITY)
Admission: RE | Admit: 2024-06-28 | Discharge: 2024-06-28 | Disposition: A | Discharge status: Home / Self Care | Source: Ambulatory Visit | Attending: Cardiovascular Disease | Admitting: Cardiovascular Disease

## 2024-06-28 ENCOUNTER — Ambulatory Visit: Payer: Self-pay | Admitting: Cardiovascular Disease

## 2024-06-28 DIAGNOSIS — I2510 Atherosclerotic heart disease of native coronary artery without angina pectoris: Secondary | ICD-10-CM | POA: Insufficient documentation

## 2024-06-28 DIAGNOSIS — I480 Paroxysmal atrial fibrillation: Secondary | ICD-10-CM | POA: Diagnosis not present

## 2024-06-28 DIAGNOSIS — I255 Ischemic cardiomyopathy: Secondary | ICD-10-CM | POA: Insufficient documentation

## 2024-06-28 DIAGNOSIS — I10 Essential (primary) hypertension: Secondary | ICD-10-CM | POA: Diagnosis not present

## 2024-06-28 DIAGNOSIS — E785 Hyperlipidemia, unspecified: Secondary | ICD-10-CM | POA: Diagnosis not present

## 2024-06-28 LAB — ECHOCARDIOGRAM COMPLETE
Area-P 1/2: 3.33 cm2
P 1/2 time: 1183 ms
S' Lateral: 4.8 cm

## 2024-07-17 ENCOUNTER — Ambulatory Visit: Attending: Cardiovascular Disease | Admitting: Pharmacist

## 2024-07-17 ENCOUNTER — Other Ambulatory Visit (HOSPITAL_COMMUNITY): Payer: Self-pay

## 2024-07-17 DIAGNOSIS — E785 Hyperlipidemia, unspecified: Secondary | ICD-10-CM | POA: Diagnosis not present

## 2024-07-17 DIAGNOSIS — I480 Paroxysmal atrial fibrillation: Secondary | ICD-10-CM | POA: Insufficient documentation

## 2024-07-17 MED ORDER — APIXABAN 5 MG PO TABS
5.0000 mg | ORAL_TABLET | Freq: Two times a day (BID) | ORAL | 1 refills | Status: AC
  Filled 2024-07-17: qty 60, 30d supply, fill #0

## 2024-07-17 MED ORDER — APIXABAN 5 MG PO TABS: 5.0000 mg | ORAL_TABLET | Freq: Two times a day (BID) | ORAL | 1 refills | Status: AC

## 2024-07-17 MED ORDER — ATORVASTATIN CALCIUM 40 MG PO TABS
40.0000 mg | ORAL_TABLET | Freq: Every day | ORAL | 3 refills | Status: AC
  Filled 2024-07-17: qty 90, 90d supply, fill #0

## 2024-07-17 NOTE — Patient Instructions (Addendum)
 Please start taking Eliquis  5mg  twice a day Please start taking atorvastatin  40mg  daily Please go to the [facility] as soon as possible to make sure you can get your Eliquis  through them  Please come back for repeat lipid panel in 2 months

## 2024-07-17 NOTE — Progress Notes (Addendum)
 Patient ID: Victor Estes                 DOB: April 30, 1946                    MRN: [id number]    HPI: Victor Estes is a 79 y.o. male patient referred to lipid clinic by Dr. Court. PMH is significant for CAD, HTN, HLD, VT s/p ICD implantation, ischemic cardiomyopathy, and paroxysmal atrial fibrillation. Patient was last seen by Dr. Court on 05/18/2024. Noted patient gets some of his medications from the [facility] pharmacy.   Patient started on simvastatin on 2012 and then transitioned to atorvastatin  in 2016. Lipid panel was most recently checked after last visit with Dr. Court. Due to elevated LDL-C, referral sent to pharmacy team to discuss additional lipid lowering therapies. Patient has a history of an MI in 1993 followed by CABG x4 in 1995. He was admitted to [facility] on 04/27/15 with sustained ventricular tachycardia. A cardiac catheterization revealed stable anatomy without a culprit lesion and therefore his VT was thought to be 'scar related'. An ICD was placed for secondary prevention. Most recent LHC done in 2022 revealing unchanged anatomy. Patient also found in be in A-fib in 06/2023 and underwent DCCV on 07/26/23. Most recent ECHO on 06/28/24 with an LVEF of 40-45%. Patient was found to be back in atrial fibrillation during his last appointment with Dr. Court in 04/2024. Patient presented to [facility] on 06/26/24 and it was discovered he was not taking his Eliquis . Procedure was rescheduled for 07/31/24.  Pharmacy Clinic: Patient returns to clinic for a formal medication reconciliation and discussion about lipid lowering therapy. He brings in all the medication bottles he is taking. Medication list updated in the chart, patient fills most medications at the [facility] in [place]. Noted patient is not currently taking atorvastatin  or Eliquis . Prescription originally sent to [facility] in 06/2023. Suspect the medication was never filled due to high copay (deductible). Discussed with Daphne Barrack, NP and DCCV will need to be rescheduled to allow patient to be on Eliquis  for at least 3 weeks prior to cardioversion.  Of note patient had two bottles of Entresto  49/51mg . Possible that he was taking double. I combined the two bottles so that he wouldn't get confused.   Most recent lipid panel 05/18/24: LDL-C 124 mg/dL, TG 865 mg/dL   Current Medications: Ezetimibe  10 mg daily (patient ran out of refills for atorvastatin )  Intolerances: none Risk Factors: Prior MI + CABG, HTN, HLD, age >31 YO LDL-C goal: <55 mg/dL  Labs: Lipid Panel     Component Value Date/Time   CHOL 193 05/18/2024 1109   TRIG 134 05/18/2024 1109   HDL 45 05/18/2024 1109   CHOLHDL 4.3 05/18/2024 1109   CHOLHDL 2.8 11/29/2021 0500   VLDL 14 11/29/2021 0500   LDLCALC 124 (H) 05/18/2024 1109   LABVLDL 24 05/18/2024 1109    Past Medical History:  Diagnosis Date   AICD (automatic cardioverter/defibrillator) present    Arthritis    Cancer (HCC)    Carotid artery disease    Diabetes mellitus without complication (HCC)    GERD (gastroesophageal reflux disease)    Hyperlipemia    Hypertension    Dr Court   Myocardial infarct [facility])    CABG-1995- LIMA-D1-LAD, SVG-OM, SVG-RCA    Ventricular tachycardia, sustained (HCC) 04/27/2015    Medications Ordered Prior to Encounter[1]  Allergies[2]  Assessment/Plan:  1. PAF (paroxysmal atrial fibrillation) (HCC) Assessment: Patient  found to be back in atrial fibrillation in clinic with Dr. Court in 04/2024 DCCV schedule for 06/26/24 had to be postponed to 07/31/24 due to patient not taking Eliquis  Patient still not taking Eliquis  at this time, appears prescription sent to [facility] was not filled due to a high copay  Plan: Start Eliquis  5 mg BID - First month free with copay card and filled at [facility]; prescription sent for further refills at the [facility]- I advised him to go to the [facility] tomorrow and make sure they can fill the  prescription. Continue amiodarone  200 mg daily as directed by EP Notified EP to reschedule DCCV  Hyperlipidemia LDL goal <70 Assessment: LDL-C not at goal of <55 mg/dL in 88/7974 LDL-C previously 54 mg/dL in 93/7976 when patient was taking atorvastatin  Patient not taking atorvastatin  at this time due to not having refills Patient adherent with ezetimibe   Plan: Restart atorvastatin  40 mg daily Continue ezetimibe  10 mg daily Recheck labs in 2 months   Thank you,  Morna Breach, PharmD, BCPS PGY2 Cardiology Pharmacy Resident   Victor Estes, Pharm.Victor Estes, CPP [facility] [address], [place], [place] [address]  Phone: [phone]; Fax: [phone]       [1]  Current Outpatient Medications on File Prior to Visit  Medication Sig Dispense Refill   acetaminophen  (TYLENOL ) 325 MG tablet Take 650 mg by mouth 2 (two) times daily. (Patient taking differently: Take 650 mg by mouth 3 (three) times daily as needed.)     amiodarone  (PACERONE ) 200 MG tablet Take 1 tablet (200 mg total) by mouth daily. 90 tablet 3   amLODipine  (NORVASC ) 5 MG tablet Take 5 mg by mouth every morning.     aspirin  81 MG EC tablet Take 1 tablet (81 mg total) by mouth daily with breakfast. 30 tablet 11   carboxymethylcellulose (REFRESH PLUS) 0.5 % SOLN 1 drop 3 (three) times daily as needed (dry eyes).     carvedilol  (COREG ) 12.5 MG tablet Take 6.25 mg by mouth 2 (two) times daily.     Cholecalciferol  (VITAMIN D3 PO) Take 1 tablet by mouth daily.     ezetimibe  (ZETIA ) 10 MG tablet Take 10 mg by mouth daily.     furosemide  (LASIX ) 20 MG tablet Take 20 mg by mouth daily as needed for fluid or edema.     metformin  (FORTAMET ) 500 MG (OSM) 24 hr tablet Take 1,000 mg by mouth 2 (two) times daily with a meal.     methocarbamol  (ROBAXIN ) 500 MG tablet Take 1 tablet (500 mg total) by mouth at bedtime. 30 tablet 11   nitroGLYCERIN  (NITROSTAT ) 0.4 MG SL  tablet Place 0.4 mg under the tongue every 5 (five) minutes as needed for chest pain.     pantoprazole  (PROTONIX ) 40 MG tablet Take 1 tablet (40 mg total) by mouth 2 (two) times daily. 180 tablet 3   sacubitril -valsartan  (ENTRESTO ) 49-51 MG Take 1 tablet by mouth 2 (two) times daily. 60 tablet 11   spironolactone  (ALDACTONE ) 25 MG tablet Take 25 mg by mouth daily. (Patient taking differently: Take 12.5 mg by mouth daily.)     tamsulosin  (FLOMAX ) 0.4 MG CAPS capsule Take 0.4 mg by mouth 2 (two) times daily.     traZODone (DESYREL) 100 MG tablet Take 100 mg by mouth at bedtime.     metoprolol  succinate (TOPROL  XL) 25 MG 24 hr tablet Take 0.5 tablets (12.5 mg total)  by mouth at bedtime. (Patient taking differently: Take 12.5 mg by mouth daily.)     No current facility-administered medications on file prior to visit.  [2]  Allergies Allergen Reactions   Clarithromycin Swelling

## 2024-07-17 NOTE — Assessment & Plan Note (Addendum)
 Assessment: LDL-C not at goal of <55 mg/dL in 88/7974 LDL-C previously 54 mg/dL in 93/7976 when patient was taking atorvastatin  Patient not taking atorvastatin  at this time due to not having refills Patient adherent with ezetimibe   Plan: Restart atorvastatin  40 mg daily- rx sent to [facility] pharmacy Continue ezetimibe  10 mg daily Recheck labs in 2 months

## 2024-07-17 NOTE — Assessment & Plan Note (Addendum)
 Assessment: Patient found to be back in atrial fibrillation in clinic with Dr. Court in 04/2024 DCCV schedule for 06/26/24 had to be postponed to 07/31/24 due to patient not taking Eliquis  Patient still not taking Eliquis  at this time, appears prescription sent to [facility] was not filled due to a high copay  Plan: Start Eliquis  5 mg BID - First month free with copay card and filled at [facility]; prescription sent for further refills at the [facility] I advised him to go to the [facility] tomorrow and make sure they can fill the prescription. Continue amiodarone  200 mg daily as directed by EP Notified EP to reschedule DCCV

## 2024-07-31 ENCOUNTER — Ambulatory Visit (HOSPITAL_COMMUNITY): Admit: 2024-07-31 | Admitting: Internal Medicine

## 2024-07-31 ENCOUNTER — Encounter (HOSPITAL_COMMUNITY): Payer: Self-pay

## 2024-07-31 SURGERY — CARDIOVERSION (CATH LAB)
Anesthesia: General

## 2024-08-07 ENCOUNTER — Ambulatory Visit: Admitting: Cardiovascular Disease
